# Patient Record
Sex: Female | Born: 1965 | Race: White | Hispanic: No | Smoking: Current some day smoker
Health system: Southern US, Community
[De-identification: ages and names within clinical notes are randomized; demographics above are authoritative.]

## PROBLEM LIST (undated history)

## (undated) DIAGNOSIS — Z5189 Encounter for other specified aftercare: Secondary | ICD-10-CM

## (undated) DIAGNOSIS — M810 Age-related osteoporosis without current pathological fracture: Secondary | ICD-10-CM

## (undated) DIAGNOSIS — R011 Cardiac murmur, unspecified: Secondary | ICD-10-CM

## (undated) DIAGNOSIS — Z8711 Personal history of peptic ulcer disease: Secondary | ICD-10-CM

## (undated) DIAGNOSIS — T7840XA Allergy, unspecified, initial encounter: Secondary | ICD-10-CM

## (undated) DIAGNOSIS — I10 Essential (primary) hypertension: Secondary | ICD-10-CM

## (undated) DIAGNOSIS — M1990 Unspecified osteoarthritis, unspecified site: Secondary | ICD-10-CM

## (undated) DIAGNOSIS — C801 Malignant (primary) neoplasm, unspecified: Secondary | ICD-10-CM

## (undated) DIAGNOSIS — F32A Depression, unspecified: Secondary | ICD-10-CM

## (undated) DIAGNOSIS — E079 Disorder of thyroid, unspecified: Secondary | ICD-10-CM

## (undated) DIAGNOSIS — S7290XA Unspecified fracture of unspecified femur, initial encounter for closed fracture: Secondary | ICD-10-CM

## (undated) DIAGNOSIS — M4850XA Collapsed vertebra, not elsewhere classified, site unspecified, initial encounter for fracture: Secondary | ICD-10-CM

## (undated) DIAGNOSIS — Z9884 Bariatric surgery status: Secondary | ICD-10-CM

## (undated) DIAGNOSIS — S72009A Fracture of unspecified part of neck of unspecified femur, initial encounter for closed fracture: Secondary | ICD-10-CM

## (undated) DIAGNOSIS — F1910 Other psychoactive substance abuse, uncomplicated: Secondary | ICD-10-CM

## (undated) HISTORY — PX: BREAST BIOPSY: SHX20

## (undated) HISTORY — DX: Cardiac murmur, unspecified: R01.1

## (undated) HISTORY — DX: Allergy, unspecified, initial encounter: T78.40XA

## (undated) HISTORY — DX: Unspecified osteoarthritis, unspecified site: M19.90

## (undated) HISTORY — DX: Other psychoactive substance abuse, uncomplicated: F19.10

## (undated) HISTORY — DX: Collapsed vertebra, not elsewhere classified, site unspecified, initial encounter for fracture: M48.50XA

## (undated) HISTORY — DX: Disorder of thyroid, unspecified: E07.9

## (undated) HISTORY — DX: Depression, unspecified: F32.A

## (undated) HISTORY — PX: GASTRIC BYPASS: SHX52

## (undated) HISTORY — DX: Malignant (primary) neoplasm, unspecified: C80.1

## (undated) HISTORY — DX: Bariatric surgery status: Z98.84

## (undated) HISTORY — DX: Age-related osteoporosis without current pathological fracture: M81.0

## (undated) HISTORY — DX: Encounter for other specified aftercare: Z51.89

## (undated) HISTORY — DX: Fracture of unspecified part of neck of unspecified femur, initial encounter for closed fracture: S72.009A

## (undated) HISTORY — DX: Unspecified fracture of unspecified femur, initial encounter for closed fracture: S72.90XA

## (undated) HISTORY — PX: FRACTURE SURGERY: SHX138

---

## 2000-07-17 ENCOUNTER — Other Ambulatory Visit: Admission: RE | Admit: 2000-07-17 | Discharge: 2000-07-17 | Payer: Self-pay | Admitting: Obstetrics & Gynecology

## 2000-08-20 ENCOUNTER — Encounter: Payer: Self-pay | Admitting: Obstetrics & Gynecology

## 2000-08-21 ENCOUNTER — Inpatient Hospital Stay (HOSPITAL_COMMUNITY): Admission: RE | Admit: 2000-08-21 | Discharge: 2000-08-22 | Payer: Self-pay | Admitting: Obstetrics & Gynecology

## 2003-02-08 ENCOUNTER — Encounter: Payer: Self-pay | Admitting: Orthopedic Surgery

## 2003-02-08 ENCOUNTER — Encounter: Admission: RE | Admit: 2003-02-08 | Discharge: 2003-02-08 | Payer: Self-pay | Admitting: Orthopedic Surgery

## 2003-07-06 ENCOUNTER — Emergency Department (HOSPITAL_COMMUNITY): Admission: EM | Admit: 2003-07-06 | Discharge: 2003-07-06 | Payer: Self-pay | Admitting: Emergency Medicine

## 2003-11-08 ENCOUNTER — Inpatient Hospital Stay (HOSPITAL_COMMUNITY): Admission: EM | Admit: 2003-11-08 | Discharge: 2003-11-19 | Payer: Self-pay | Admitting: Psychiatry

## 2003-11-08 ENCOUNTER — Emergency Department (HOSPITAL_COMMUNITY): Admission: EM | Admit: 2003-11-08 | Discharge: 2003-11-08 | Payer: Self-pay | Admitting: Emergency Medicine

## 2004-06-03 ENCOUNTER — Emergency Department: Payer: Self-pay | Admitting: Emergency Medicine

## 2004-06-03 ENCOUNTER — Ambulatory Visit: Payer: Self-pay | Admitting: Psychiatry

## 2004-06-04 ENCOUNTER — Inpatient Hospital Stay (HOSPITAL_COMMUNITY): Admission: EM | Admit: 2004-06-04 | Discharge: 2004-06-06 | Payer: Self-pay | Admitting: Psychiatry

## 2013-05-19 DIAGNOSIS — E785 Hyperlipidemia, unspecified: Secondary | ICD-10-CM | POA: Insufficient documentation

## 2013-05-19 DIAGNOSIS — I10 Essential (primary) hypertension: Secondary | ICD-10-CM | POA: Insufficient documentation

## 2013-08-13 HISTORY — PX: GASTRIC BYPASS: SHX52

## 2016-07-25 DIAGNOSIS — E249 Cushing's syndrome, unspecified: Secondary | ICD-10-CM | POA: Diagnosis present

## 2016-08-23 DIAGNOSIS — J4489 Other specified chronic obstructive pulmonary disease: Secondary | ICD-10-CM | POA: Diagnosis present

## 2016-08-23 DIAGNOSIS — J449 Chronic obstructive pulmonary disease, unspecified: Secondary | ICD-10-CM | POA: Diagnosis present

## 2016-08-23 DIAGNOSIS — F17200 Nicotine dependence, unspecified, uncomplicated: Secondary | ICD-10-CM | POA: Insufficient documentation

## 2017-03-28 DIAGNOSIS — F411 Generalized anxiety disorder: Secondary | ICD-10-CM | POA: Insufficient documentation

## 2020-12-30 DIAGNOSIS — E222 Syndrome of inappropriate secretion of antidiuretic hormone: Secondary | ICD-10-CM | POA: Insufficient documentation

## 2021-01-20 DIAGNOSIS — E440 Moderate protein-calorie malnutrition: Secondary | ICD-10-CM | POA: Diagnosis present

## 2021-01-20 DIAGNOSIS — E46 Unspecified protein-calorie malnutrition: Secondary | ICD-10-CM | POA: Diagnosis present

## 2021-01-20 DIAGNOSIS — G4733 Obstructive sleep apnea (adult) (pediatric): Secondary | ICD-10-CM | POA: Insufficient documentation

## 2021-06-08 DIAGNOSIS — K7581 Nonalcoholic steatohepatitis (NASH): Secondary | ICD-10-CM | POA: Insufficient documentation

## 2021-06-20 DIAGNOSIS — K289 Gastrojejunal ulcer, unspecified as acute or chronic, without hemorrhage or perforation: Secondary | ICD-10-CM | POA: Insufficient documentation

## 2021-07-17 DIAGNOSIS — K559 Vascular disorder of intestine, unspecified: Secondary | ICD-10-CM | POA: Insufficient documentation

## 2021-08-20 ENCOUNTER — Inpatient Hospital Stay (HOSPITAL_COMMUNITY): Payer: BC Managed Care – PPO

## 2021-08-20 ENCOUNTER — Emergency Department (HOSPITAL_COMMUNITY): Payer: BC Managed Care – PPO

## 2021-08-20 ENCOUNTER — Encounter (HOSPITAL_COMMUNITY): Payer: Self-pay

## 2021-08-20 ENCOUNTER — Inpatient Hospital Stay (HOSPITAL_COMMUNITY)
Admission: EM | Admit: 2021-08-20 | Discharge: 2021-08-23 | DRG: 388 | Disposition: A | Discharge status: Home / Self Care | Payer: BC Managed Care – PPO | Attending: Student in an Organized Health Care Education/Training Program | Admitting: Student in an Organized Health Care Education/Training Program

## 2021-08-20 ENCOUNTER — Other Ambulatory Visit: Payer: Self-pay

## 2021-08-20 DIAGNOSIS — D649 Anemia, unspecified: Secondary | ICD-10-CM | POA: Diagnosis present

## 2021-08-20 DIAGNOSIS — Z85850 Personal history of malignant neoplasm of thyroid: Secondary | ICD-10-CM

## 2021-08-20 DIAGNOSIS — Z9049 Acquired absence of other specified parts of digestive tract: Secondary | ICD-10-CM | POA: Diagnosis not present

## 2021-08-20 DIAGNOSIS — K838 Other specified diseases of biliary tract: Secondary | ICD-10-CM | POA: Diagnosis present

## 2021-08-20 DIAGNOSIS — E871 Hypo-osmolality and hyponatremia: Secondary | ICD-10-CM | POA: Diagnosis present

## 2021-08-20 DIAGNOSIS — M24452 Recurrent dislocation, left hip: Secondary | ICD-10-CM | POA: Diagnosis present

## 2021-08-20 DIAGNOSIS — F1120 Opioid dependence, uncomplicated: Secondary | ICD-10-CM | POA: Diagnosis present

## 2021-08-20 DIAGNOSIS — J449 Chronic obstructive pulmonary disease, unspecified: Secondary | ICD-10-CM | POA: Diagnosis present

## 2021-08-20 DIAGNOSIS — F319 Bipolar disorder, unspecified: Secondary | ICD-10-CM | POA: Diagnosis present

## 2021-08-20 DIAGNOSIS — R109 Unspecified abdominal pain: Secondary | ICD-10-CM | POA: Diagnosis present

## 2021-08-20 DIAGNOSIS — M25552 Pain in left hip: Secondary | ICD-10-CM | POA: Diagnosis present

## 2021-08-20 DIAGNOSIS — D72819 Decreased white blood cell count, unspecified: Secondary | ICD-10-CM | POA: Diagnosis present

## 2021-08-20 DIAGNOSIS — K56699 Other intestinal obstruction unspecified as to partial versus complete obstruction: Principal | ICD-10-CM | POA: Diagnosis present

## 2021-08-20 DIAGNOSIS — G8929 Other chronic pain: Secondary | ICD-10-CM | POA: Diagnosis present

## 2021-08-20 DIAGNOSIS — E43 Unspecified severe protein-calorie malnutrition: Secondary | ICD-10-CM | POA: Diagnosis present

## 2021-08-20 DIAGNOSIS — Z20822 Contact with and (suspected) exposure to covid-19: Secondary | ICD-10-CM | POA: Diagnosis present

## 2021-08-20 DIAGNOSIS — E440 Moderate protein-calorie malnutrition: Secondary | ICD-10-CM | POA: Diagnosis present

## 2021-08-20 DIAGNOSIS — R748 Abnormal levels of other serum enzymes: Secondary | ICD-10-CM | POA: Diagnosis present

## 2021-08-20 DIAGNOSIS — E249 Cushing's syndrome, unspecified: Secondary | ICD-10-CM | POA: Diagnosis present

## 2021-08-20 DIAGNOSIS — R1013 Epigastric pain: Secondary | ICD-10-CM | POA: Diagnosis present

## 2021-08-20 DIAGNOSIS — M810 Age-related osteoporosis without current pathological fracture: Secondary | ICD-10-CM | POA: Diagnosis present

## 2021-08-20 DIAGNOSIS — E876 Hypokalemia: Secondary | ICD-10-CM | POA: Diagnosis present

## 2021-08-20 DIAGNOSIS — I10 Essential (primary) hypertension: Secondary | ICD-10-CM | POA: Diagnosis present

## 2021-08-20 DIAGNOSIS — Z681 Body mass index (BMI) 19 or less, adult: Secondary | ICD-10-CM | POA: Diagnosis not present

## 2021-08-20 DIAGNOSIS — Z9884 Bariatric surgery status: Secondary | ICD-10-CM

## 2021-08-20 DIAGNOSIS — R0902 Hypoxemia: Secondary | ICD-10-CM | POA: Diagnosis present

## 2021-08-20 DIAGNOSIS — E8809 Other disorders of plasma-protein metabolism, not elsewhere classified: Secondary | ICD-10-CM | POA: Diagnosis present

## 2021-08-20 DIAGNOSIS — K760 Fatty (change of) liver, not elsewhere classified: Secondary | ICD-10-CM | POA: Diagnosis present

## 2021-08-20 DIAGNOSIS — R933 Abnormal findings on diagnostic imaging of other parts of digestive tract: Secondary | ICD-10-CM | POA: Insufficient documentation

## 2021-08-20 DIAGNOSIS — J439 Emphysema, unspecified: Secondary | ICD-10-CM | POA: Diagnosis present

## 2021-08-20 DIAGNOSIS — E039 Hypothyroidism, unspecified: Secondary | ICD-10-CM | POA: Diagnosis present

## 2021-08-20 DIAGNOSIS — E46 Unspecified protein-calorie malnutrition: Secondary | ICD-10-CM | POA: Diagnosis present

## 2021-08-20 DIAGNOSIS — Z79899 Other long term (current) drug therapy: Secondary | ICD-10-CM | POA: Diagnosis not present

## 2021-08-20 DIAGNOSIS — R7989 Other specified abnormal findings of blood chemistry: Secondary | ICD-10-CM | POA: Diagnosis present

## 2021-08-20 DIAGNOSIS — Z96642 Presence of left artificial hip joint: Secondary | ICD-10-CM | POA: Diagnosis present

## 2021-08-20 DIAGNOSIS — Z79890 Hormone replacement therapy (postmenopausal): Secondary | ICD-10-CM | POA: Diagnosis not present

## 2021-08-20 DIAGNOSIS — R531 Weakness: Secondary | ICD-10-CM

## 2021-08-20 DIAGNOSIS — Z95828 Presence of other vascular implants and grafts: Secondary | ICD-10-CM

## 2021-08-20 HISTORY — DX: Essential (primary) hypertension: I10

## 2021-08-20 HISTORY — DX: Personal history of peptic ulcer disease: Z87.11

## 2021-08-20 LAB — CBC WITH DIFFERENTIAL/PLATELET
Abs Immature Granulocytes: 0.02 10*3/uL (ref 0.00–0.07)
Basophils Absolute: 0 10*3/uL (ref 0.0–0.1)
Basophils Relative: 0 %
Eosinophils Absolute: 0 10*3/uL (ref 0.0–0.5)
Eosinophils Relative: 1 %
HCT: 40.8 % (ref 36.0–46.0)
Hemoglobin: 12.3 g/dL (ref 12.0–15.0)
Immature Granulocytes: 1 %
Lymphocytes Relative: 21 %
Lymphs Abs: 0.7 10*3/uL (ref 0.7–4.0)
MCH: 28.1 pg (ref 26.0–34.0)
MCHC: 30.1 g/dL (ref 30.0–36.0)
MCV: 93.2 fL (ref 80.0–100.0)
Monocytes Absolute: 0.3 10*3/uL (ref 0.1–1.0)
Monocytes Relative: 8 %
Neutro Abs: 2.3 10*3/uL (ref 1.7–7.7)
Neutrophils Relative %: 69 %
Platelets: 205 10*3/uL (ref 150–400)
RBC: 4.38 MIL/uL (ref 3.87–5.11)
RDW: 14.1 % (ref 11.5–15.5)
WBC: 3.3 10*3/uL — ABNORMAL LOW (ref 4.0–10.5)
nRBC: 0 % (ref 0.0–0.2)

## 2021-08-20 LAB — COMPREHENSIVE METABOLIC PANEL
ALT: 110 U/L — ABNORMAL HIGH (ref 0–44)
AST: 111 U/L — ABNORMAL HIGH (ref 15–41)
Albumin: 3.1 g/dL — ABNORMAL LOW (ref 3.5–5.0)
Alkaline Phosphatase: 733 U/L — ABNORMAL HIGH (ref 38–126)
Anion gap: 7 (ref 5–15)
BUN: 17 mg/dL (ref 6–20)
CO2: 28 mmol/L (ref 22–32)
Calcium: 9.5 mg/dL (ref 8.9–10.3)
Chloride: 100 mmol/L (ref 98–111)
Creatinine, Ser: 0.51 mg/dL (ref 0.44–1.00)
GFR, Estimated: >60 mL/min (ref >60)
Glucose, Bld: 119 mg/dL — ABNORMAL HIGH (ref 70–99)
Potassium: 4.1 mmol/L (ref 3.5–5.1)
Sodium: 135 mmol/L (ref 135–145)
Total Bilirubin: 0.3 mg/dL (ref 0.3–1.2)
Total Protein: 7.3 g/dL (ref 6.5–8.1)

## 2021-08-20 LAB — RESP PANEL BY RT-PCR (FLU A&B, COVID) ARPGX2
Influenza A by PCR: NEGATIVE
Influenza B by PCR: NEGATIVE
SARS Coronavirus 2 by RT PCR: NEGATIVE

## 2021-08-20 LAB — LACTIC ACID, PLASMA: Lactic Acid, Venous: 1.6 mmol/L (ref 0.5–1.9)

## 2021-08-20 LAB — LIPASE, BLOOD: Lipase: 27 U/L (ref 11–51)

## 2021-08-20 MED ORDER — ACETAMINOPHEN 325 MG PO TABS
650.0000 mg | ORAL_TABLET | Freq: Four times a day (QID) | ORAL | Status: DC | PRN
  Administered 2021-08-20 – 2021-08-21 (×3): 650 mg via ORAL
  Filled 2021-08-20 (×3): qty 2

## 2021-08-20 MED ORDER — ENOXAPARIN SODIUM 30 MG/0.3ML IJ SOSY
30.0000 mg | PREFILLED_SYRINGE | INTRAMUSCULAR | Status: DC
  Administered 2021-08-20: 30 mg via SUBCUTANEOUS
  Filled 2021-08-20: qty 0.3

## 2021-08-20 MED ORDER — HYDROMORPHONE HCL 1 MG/ML IJ SOLN
1.0000 mg | INTRAMUSCULAR | Status: DC | PRN
  Administered 2021-08-20 – 2021-08-23 (×18): 1 mg via INTRAVENOUS
  Filled 2021-08-20 (×19): qty 1

## 2021-08-20 MED ORDER — PANTOPRAZOLE SODIUM 40 MG IV SOLR
40.0000 mg | Freq: Two times a day (BID) | INTRAVENOUS | Status: DC
  Administered 2021-08-20 – 2021-08-22 (×3): 40 mg via INTRAVENOUS
  Filled 2021-08-20 (×3): qty 40

## 2021-08-20 MED ORDER — IOHEXOL 350 MG/ML SOLN
100.0000 mL | Freq: Once | INTRAVENOUS | Status: AC | PRN
  Administered 2021-08-20: 100 mL via INTRAVENOUS

## 2021-08-20 MED ORDER — SUCRALFATE 1 G PO TABS
1.0000 g | ORAL_TABLET | Freq: Three times a day (TID) | ORAL | Status: DC
  Administered 2021-08-20 – 2021-08-21 (×3): 1 g via ORAL
  Filled 2021-08-20 (×3): qty 1

## 2021-08-20 MED ORDER — LEVOTHYROXINE SODIUM 100 MCG PO TABS
100.0000 ug | ORAL_TABLET | Freq: Every day | ORAL | Status: DC
  Administered 2021-08-21 – 2021-08-23 (×3): 100 ug via ORAL
  Filled 2021-08-20 (×4): qty 1

## 2021-08-20 MED ORDER — SODIUM CHLORIDE 0.9 % IV SOLN: 1.0000 g | Freq: Once | INTRAVENOUS | Status: DC

## 2021-08-20 MED ORDER — SODIUM CHLORIDE 0.9 % IV SOLN: 500.0000 mg | Freq: Once | INTRAVENOUS | Status: DC

## 2021-08-20 MED ORDER — SODIUM CHLORIDE 0.9 % IV BOLUS
1000.0000 mL | Freq: Once | INTRAVENOUS | Status: AC
  Administered 2021-08-20: 1000 mL via INTRAVENOUS

## 2021-08-20 MED ORDER — PROCHLORPERAZINE EDISYLATE 10 MG/2ML IJ SOLN
10.0000 mg | Freq: Once | INTRAMUSCULAR | Status: AC
  Administered 2021-08-20: 10 mg via INTRAVENOUS
  Filled 2021-08-20: qty 2

## 2021-08-20 MED ORDER — QUETIAPINE FUMARATE 100 MG PO TABS
300.0000 mg | ORAL_TABLET | Freq: Every day | ORAL | Status: DC
  Administered 2021-08-20 – 2021-08-22 (×3): 300 mg via ORAL
  Filled 2021-08-20: qty 3
  Filled 2021-08-20: qty 1
  Filled 2021-08-20: qty 3
  Filled 2021-08-20: qty 1

## 2021-08-20 MED ORDER — SENNA 8.6 MG PO TABS
1.0000 | ORAL_TABLET | Freq: Two times a day (BID) | ORAL | Status: DC
  Administered 2021-08-20 – 2021-08-22 (×6): 8.6 mg via ORAL
  Filled 2021-08-20 (×7): qty 1

## 2021-08-20 MED ORDER — HYDROMORPHONE HCL 1 MG/ML IJ SOLN
1.0000 mg | Freq: Once | INTRAMUSCULAR | Status: AC
  Administered 2021-08-20: 1 mg via INTRAVENOUS
  Filled 2021-08-20: qty 1

## 2021-08-20 MED ORDER — MISOPROSTOL 200 MCG PO TABS
200.0000 ug | ORAL_TABLET | Freq: Four times a day (QID) | ORAL | Status: DC
  Administered 2021-08-20 – 2021-08-23 (×11): 200 ug via ORAL
  Filled 2021-08-20 (×15): qty 1

## 2021-08-20 MED ORDER — OXYCODONE HCL 5 MG PO TABS
5.0000 mg | ORAL_TABLET | ORAL | Status: DC | PRN
  Administered 2021-08-20 – 2021-08-22 (×8): 5 mg via ORAL
  Filled 2021-08-20 (×8): qty 1

## 2021-08-20 MED ORDER — PANTOPRAZOLE SODIUM 40 MG IV SOLR
40.0000 mg | Freq: Once | INTRAVENOUS | Status: AC
  Administered 2021-08-20: 40 mg via INTRAVENOUS
  Filled 2021-08-20: qty 40

## 2021-08-20 MED ORDER — HYDROMORPHONE HCL 1 MG/ML IJ SOLN
1.0000 mg | Freq: Once | INTRAMUSCULAR | Status: AC
Start: 2021-08-20 — End: 2021-08-20
  Administered 2021-08-20: 1 mg via INTRAVENOUS
  Filled 2021-08-20: qty 1

## 2021-08-20 MED ORDER — ONDANSETRON HCL 4 MG PO TABS
4.0000 mg | ORAL_TABLET | Freq: Four times a day (QID) | ORAL | Status: DC | PRN
  Administered 2021-08-23: 4 mg via ORAL
  Filled 2021-08-20: qty 1

## 2021-08-20 MED ORDER — SPIRONOLACTONE 25 MG PO TABS
25.0000 mg | ORAL_TABLET | Freq: Every day | ORAL | Status: DC
  Administered 2021-08-20 – 2021-08-23 (×4): 25 mg via ORAL
  Filled 2021-08-20 (×5): qty 1

## 2021-08-20 MED ORDER — SODIUM CHLORIDE 0.9 % IV SOLN
1000.0000 mL | INTRAVENOUS | Status: AC
  Administered 2021-08-20 – 2021-08-22 (×4): 1000 mL via INTRAVENOUS

## 2021-08-20 MED ORDER — ALUM & MAG HYDROXIDE-SIMETH 200-200-20 MG/5ML PO SUSP: 30.0000 mL | Freq: Four times a day (QID) | ORAL | Status: DC | PRN

## 2021-08-20 MED ORDER — LIDOCAINE VISCOUS HCL 2 % MT SOLN
10.0000 mL | Freq: Once | OROMUCOSAL | Status: AC
  Administered 2021-08-20: 10 mL via OROMUCOSAL
  Filled 2021-08-20: qty 15

## 2021-08-20 MED ORDER — ONDANSETRON HCL 4 MG/2ML IJ SOLN
4.0000 mg | Freq: Four times a day (QID) | INTRAMUSCULAR | Status: DC | PRN
  Administered 2021-08-20 – 2021-08-23 (×5): 4 mg via INTRAVENOUS
  Filled 2021-08-20 (×5): qty 2

## 2021-08-20 MED ORDER — ACETAMINOPHEN 650 MG RE SUPP: 650.0000 mg | Freq: Four times a day (QID) | RECTAL | Status: DC | PRN

## 2021-08-20 MED ORDER — LIDOCAINE VISCOUS HCL 2 % MT SOLN
15.0000 mL | Freq: Four times a day (QID) | OROMUCOSAL | Status: DC | PRN
  Filled 2021-08-20: qty 15

## 2021-08-20 MED ORDER — DIVALPROEX SODIUM 125 MG PO DR TAB
250.0000 mg | DELAYED_RELEASE_TABLET | Freq: Two times a day (BID) | ORAL | Status: DC
  Administered 2021-08-20 – 2021-08-23 (×6): 250 mg via ORAL
  Filled 2021-08-20 (×8): qty 2

## 2021-08-20 MED ORDER — SODIUM CHLORIDE 0.9 % IV BOLUS (SEPSIS)
1000.0000 mL | Freq: Once | INTRAVENOUS | Status: AC
  Administered 2021-08-20: 1000 mL via INTRAVENOUS

## 2021-08-20 MED ORDER — GADOBUTROL 1 MMOL/ML IV SOLN
5.0000 mL | Freq: Once | INTRAVENOUS | Status: AC | PRN
  Administered 2021-08-20: 5 mL via INTRAVENOUS

## 2021-08-20 MED ORDER — METOPROLOL TARTRATE 25 MG PO TABS
25.0000 mg | ORAL_TABLET | Freq: Two times a day (BID) | ORAL | Status: DC
  Administered 2021-08-20 – 2021-08-23 (×7): 25 mg via ORAL
  Filled 2021-08-20 (×7): qty 1

## 2021-08-20 NOTE — ED Notes (Signed)
Pt transported to MRI with tech

## 2021-08-20 NOTE — ED Provider Notes (Signed)
Blood pressure 138/81, pulse (!) 125, temperature 98 F (36.7 C), temperature source Oral, resp. rate 17, height 5' 7"  (1.702 m), weight 51.3 kg, SpO2 97 %.  Assuming care from Dr. Leonette Monarch.  In short, Amanda Hendricks is a 56 y.o. female with a chief complaint of Abdominal Pain and Hip Pain .  Refer to the original H&P for additional details.  The current plan of care is to follow up on labs and obtain CT pending creatinine.  09:47 AM  CT imaging reviewed.  The patient has no complication from Roux-en-Y gastric bypass noted.  Her intra and extrahepatic biliary duct is dilated beyond what would be expected with history of cholecystectomy.  Plan for right upper quadrant abdominal ultrasound.  Multifocal pneumonia noted on CT as well.  Have added on a COVID test but patient with few pneumonia symptoms.  Ultimately, may require MRCP for further evaluation.  LFTs and alk phos are elevated although bilirubin is normal.  No leukocytosis.  In review of outside records from [facility] in care everywhere, on 12/3 the patient had an alk phos in the 300s but normal LFTs. Does values are higher.   10:50 AM  Spoke with [facility] our GI regarding consultation.  Agree with plan for MRCP and they will follow along.  Patient does have cough and some shortness of breath symptoms.  Her COVID and flu tests are negative.  Question of multifocal pneumonia on CT.  Will start Rocephin and azithromycin.   Discussed patient's case with IM teaching to request admission. Patient and family (if present) updated with plan. Care transferred to teaching service.  I reviewed all nursing notes, vitals, pertinent old records, EKGs, labs, imaging (as available).    Amanda Fast, MD 08/20/21 1154

## 2021-08-20 NOTE — ED Notes (Signed)
Pt return from MRI

## 2021-08-20 NOTE — ED Provider Notes (Signed)
[facility] EMERGENCY DEPARTMENT Provider Note  CSN: [id number] Arrival date & time: 08/20/21 0501  Chief Complaint(s) Abdominal Pain and Hip Pain  HPI Amanda Hendricks is a 56 y.o. female with a past medical history including status post gastric bypass complicated by bleeding peptic ulcers, bipolar, hypertension, recurrent left hip dislocations following replacement.  Patient was recently hospitalized at [facility] after being hospitalized from November 3 to January 5 for bleeding ulcers, recurring left hip dislocation but outside records reviewed.  Patient is here for epigastric abdominal pain that became severely worse last night.  No recent emesis.  Patient reports that she was told she could drink Coca-Cola and did so yesterday.  Does not take much p.o. as she is currently getting TPN.  Patient has not had a bowel movement in 4 days.  Denies melena.  No fevers or chills.  Endorses chronic coughing which is where it actually exacerbated her pain tonight.  Denied chest pain.  Reports that after the pain worsens at night she felt her heart racing and felt short of breath because of it.  This has resolved.  Also reports left hip pain stating that she slipped off the toilet yesterday and felt a pop. She has been hurting even more than normal since then.  The history is provided by the patient.   Past Medical History Past Medical History:  Diagnosis Date   History of stomach ulcers    Hypertension    There are no problems to display for this patient.  Home Medication(s) Prior to Admission medications   Medication Sig Start Date End Date Taking? Authorizing Provider  acetaminophen (TYLENOL) 325 MG tablet Take 650 mg by mouth every 6 (six) hours as needed for mild pain or headache. 08/17/21  Yes [provider]  calcium carbonate (OS-CAL) 1250 (500 Ca) MG chewable tablet Chew 500 mg by mouth 3 (three) times daily as needed for heartburn.   Yes [provider]  divalproex (DEPAKOTE) 250 MG DR tablet Take 250 mg by mouth 2 (two) times daily. 08/17/21  Yes [provider]  DULoxetine (CYMBALTA) 30 MG capsule Take 30 mg by mouth daily. 08/17/21 08/17/22 Yes [provider]  ferrous sulfate 325 (65 FE) MG EC tablet Take 1 tablet by mouth daily with breakfast. 08/17/21  Yes [provider]  levothyroxine (SYNTHROID) 100 MCG tablet Take 100 mcg by mouth daily. 02/21/21 02/21/22 Yes [provider]  Magnesium Chloride 64 MG TBEC Take 535 mg by mouth in the morning and at bedtime. 08/17/21  Yes [provider]  methocarbamol (ROBAXIN) 500 MG tablet Take 1,000 mg by mouth every 8 (eight) hours as needed for muscle spasms.   Yes [provider]  metoprolol tartrate (LOPRESSOR) 25 MG tablet Take 25 mg by mouth in the morning and at bedtime. 04/28/21 04/28/22 Yes [provider]  misoprostol (CYTOTEC) 200 MCG tablet Take 200 mcg by mouth 4 (four) times daily. 08/17/21  Yes [provider]  naloxone (NARCAN) nasal spray 4 mg/0.1 mL Place 0.4 mg into the nose as needed (accidental overdose). 02/09/20  Yes [provider]  ondansetron (ZOFRAN-ODT) 4 MG disintegrating tablet Take 4 mg by mouth every 8 (eight) hours as needed for nausea or vomiting. 08/17/21  Yes [provider]  oxyCODONE (OXY IR/ROXICODONE) 5 MG immediate release tablet Take 5 mg by mouth every 4 (four) hours as needed for moderate pain.   Yes [provider]  pantoprazole (PROTONIX) 40 MG  tablet Take 40 mg by mouth 2 (two) times daily. 08/17/21  Yes [provider]  polyethylene glycol powder (GLYCOLAX/MIRALAX) 17 GM/SCOOP powder Take 17 g by mouth daily as needed for mild constipation. 08/17/21  Yes [provider]  prochlorperazine (COMPAZINE) 5 MG tablet Take 5 mg by mouth every 8 (eight) hours as needed for nausea or vomiting. 08/17/21  Yes [provider]  promethazine (PHENERGAN) 12.5  MG tablet Take 12.5 mg by mouth every 8 (eight) hours as needed for nausea or vomiting. 08/17/21  Yes [provider]  QUEtiapine (SEROQUEL) 300 MG tablet Take 300 mg by mouth at bedtime. 08/17/21  Yes [provider]  senna-docusate (SENOKOT-S) 8.6-50 MG tablet Take 2 tablets by mouth 2 (two) times daily as needed for mild constipation. 08/17/21  Yes [provider]  spironolactone (ALDACTONE) 25 MG tablet Take 25 mg by mouth daily. 08/17/21  Yes [provider]  sucralfate (CARAFATE) 1 GM/10ML suspension Take 10 mLs by mouth 4 (four) times daily -  before meals and at bedtime. 08/17/21 02/13/22 Yes [provider]                                                                                                                                    Allergies Lisinopril  Review of Systems Review of Systems As noted in HPI  Physical Exam Vital Signs  I have reviewed the triage vital signs BP 138/81    Pulse (!) 125    Temp 98 F (36.7 C) (Oral)    Resp 17    Ht 5' 7"  (1.702 m)    Wt 51.3 kg    SpO2 97%    BMI 17.70 kg/m   Physical Exam Vitals reviewed.  Constitutional:      General: She is not in acute distress.    Appearance: She is well-developed. She is not diaphoretic.  HENT:     Head: Normocephalic and atraumatic.     Nose: Nose normal.  Eyes:     General: No scleral icterus.       Right eye: No discharge.        Left eye: No discharge.     Conjunctiva/sclera: Conjunctivae normal.     Pupils: Pupils are equal, round, and reactive to light.  Cardiovascular:     Rate and Rhythm: Normal rate and regular rhythm.     Heart sounds: No murmur heard.   No friction rub. No gallop.  Pulmonary:     Effort: Pulmonary effort is normal. No respiratory distress.     Breath sounds: Normal breath sounds. No stridor. No rales.  Abdominal:     General: There is no distension.     Palpations: Abdomen is soft.     Tenderness: There is abdominal tenderness in the  right upper quadrant and epigastric area. There is guarding. There is no rebound.  Musculoskeletal:       Arms:  Cervical back: Normal range of motion and neck supple.     Left hip: Tenderness present.       Legs:  Skin:    General: Skin is warm and dry.     Findings: No erythema or rash.  Neurological:     Mental Status: She is alert and oriented to person, place, and time.    ED Results and Treatments Labs (all labs ordered are listed, but only abnormal results are displayed) Labs Reviewed  COMPREHENSIVE METABOLIC PANEL - Abnormal; Notable for the following components:      Result Value   Glucose, Bld 119 (*)    Albumin 3.1 (*)    AST 111 (*)    ALT 110 (*)    Alkaline Phosphatase 733 (*)    All other components within normal limits  LIPASE, BLOOD  CBC WITH DIFFERENTIAL/PLATELET  LACTIC ACID, PLASMA  I-STAT CHEM 8, ED                                                                                                                         EKG  EKG Interpretation  Date/Time:    Ventricular Rate:    PR Interval:    QRS Duration:   QT Interval:    QTC Calculation:   R Axis:     Text Interpretation:         Radiology DG HIP UNILAT W OR W/O PELVIS 2-3 VIEWS LEFT  Result Date: 08/20/2021 CLINICAL DATA:  Left hip pain, recent surgery EXAM: DG HIP (WITH OR WITHOUT PELVIS) 2-3V LEFT COMPARISON:  None. FINDINGS: Evidence of prior complex revision left total hip arthroplasty. The femoral component appears located with respect to the acetabular component. The lateral buttress plate and cerclage wire construct appears intact. No evidence of periprosthetic fracture or other hardware related complication. Visualized bony pelvis is intact. Oral contrast material partially opacifies the colon. IMPRESSION: Surgical changes of prior complex revision left total hip arthroplasty without evidence of hardware complication. Electronically Signed   By: Jacqulynn Cadet M.D.   On:  08/20/2021 07:04    Pertinent labs & imaging results that were available during my care of the patient were reviewed by me and considered in my medical decision making (see MDM for details).  Medications Ordered in ED Medications  sodium chloride 0.9 % bolus 1,000 mL (has no administration in time range)  sodium chloride 0.9 % bolus 1,000 mL (1,000 mLs Intravenous New Bag/Given 08/20/21 4742)    Followed by  0.9 %  sodium chloride infusion (has no administration in time range)  HYDROmorphone (DILAUDID) injection 1 mg (1 mg Intravenous Given 08/20/21 0639)  pantoprazole (PROTONIX) injection 40 mg (40 mg Intravenous Given 08/20/21 0639)  lidocaine (XYLOCAINE) 2 % viscous mouth solution 10 mL (10 mLs Mouth/Throat Given 08/20/21 0640)  prochlorperazine (COMPAZINE) injection 10 mg (10 mg Intravenous Given 08/20/21 0635)  Procedures Procedures  (including critical care time)  Medical Decision Making / ED Course        Epigastric abdominal pain Status post gastric bypass with known gastric ulcers. Will assess for any complications from the above.  We will also assess for biliary disease and pancreatitis. Presentation is not concerning for cardiac etiology but will obtain EKG for baseline. Work-up ordered to assess concerns above.  Labs and imaging independently interpreted by me and noted below: Elevated LFT, doubled since 12/01 at [facility] Intact renal function No evidence of pancreatitis Management: Will provide patient with IV pain medicine, IV fluids, antiemetic, and GI cocktail. Low threshold for CT      Left hip pain Known left hip replacement complicated by recurrent dislocations. No recent trauma but patient reported a pop yesterday. Will assess for recurrent dislocation. Work-up ordered to assess concerns above.  Imaging independently interpreted by me and  noted below: Hip XR w/o dislocation.  Confirmed by radiology who also noted no hardware instability Management: IV pain meds Reassessment: Pain improved   Patient care turned over to oncoming provider. Patient case and results discussed in detail; please see their note for further ED managment.    Final Clinical Impression(s) / ED Diagnoses Final diagnoses:  Epigastric abdominal pain  Left hip pain           This chart was dictated using voice recognition software.  Despite best efforts to proofread,  errors can occur which can change the documentation meaning.    Fatima Blank, MD 08/20/21 [phone]

## 2021-08-20 NOTE — Progress Notes (Addendum)
°   08/20/21 1848  TOC Assessment  Expected Discharge [place]  Final next level of care [place] (Patient is on TPN and  supplies are from [facility] [phone])  Choice offered to / list presented to  Patient  DME Agency Other - Comment (TPN and supplies are provided by [facility])  [place] Other - See comment ([facility] [phone])   ED RNCM met with patient in H21 to discuss TOC transitional care planning. Patient presents to [facility] today with Abdominal Pains. Patient lives at home alone Patient has had recurrent GI bleeds and was started on TNP since Nov 2022  at home, managed by [facility] Patient receives primary care with [facility] Dr. Shelton Silvas.  TOC will continue to follow patient to secure safe transition home.

## 2021-08-20 NOTE — ED Triage Notes (Signed)
Pt BIB [facility] from home c/o CP that felt like tightness and pt states she felt like her heart was racing. Pt states it woke her up out of her sleep at 3am. Pt also endorses nausea. Pt rates pain 5/10. Pt states the racing feeling went away. Pt has PICC in right arm and receives TPN. Pt received 324 ASA. Pt also took 68m of oxycodone before leaving the house.

## 2021-08-20 NOTE — Progress Notes (Deleted)
Date: 08/20/2021               Patient Name:  Amanda Hendricks MRN: [id number]  DOB: July 11, 1966 Age / Sex: 56 y.o., female   PCP: Pcp, No         Medical Service: Internal Medicine Teaching Service         Attending Physician: Dr. Evette Doffing, Mallie Mussel, *    First Contact: Dr. Marlou Sa Pager: [phone]  Second Contact: Dr. Collene Gobble Pager: [phone]       After Hours (After 5p/  First Contact Pager: [phone]  weekends / holidays): Second Contact Pager: [phone]   Chief Complaint: abdominal pain  History of Present Illness: Amanda Hendricks is a 56 y.o. F with past medical history of gastric bypass surgery in 2015, cholecystectomy, Cushing disease, osteoporosis, hypothyroidism, who presents with increased abdominal and left hip pain that began on 01/06. The patient was recently admitted to [facility] for 2 months after suffering a hip location and this hospital stay was complicated by recurrent GI bleeds, marginal ulcer that led to reported stenosis. She has been on TPN since November with evidence of elevated hepatic enzymes over these last several months. She is currently awaiting reversal of Roux-en-Y. She says that she returned home on 01/05 and that her pain returned on 01/06. The pain is both in her L hip as well as abdomen. She states that she is able to tolerate small amounts of broth at a time but otherwise is not able to tolerate PO intake. She has not had a bowel movement in 5 days. No urinary complaints.   She denies fevers, chills, hematemesis, hematochezia. She endorses nausea, shortness of breath associated with pain episodes.  ED Course: CBC showed leukopenia at 3.3.  CMP remarkable for hypoalbuminemia 3.1, elevated AST and ALT at 111 and 110 respectively, elevated alk phos at 733.  Lipase and lactic acid both within normal limits.  X-ray left hip without evidence of hardware complication.  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  RUQ ultrasound again showed intra and extrahepatic biliary ductal dilatation without intraductal filling defect, hepatomegaly with possible cirrhosis.  Meds:  Current Meds  Medication Sig   acetaminophen (TYLENOL) 325 MG tablet Take 650 mg by mouth every 6 (six) hours as needed for mild pain or headache.   calcium carbonate (OS-CAL) 1250 (500 Ca) MG chewable tablet Chew 500 mg by mouth 3 (three) times daily as needed for heartburn.   divalproex (DEPAKOTE) 250 MG DR tablet Take 250 mg by mouth 2 (two) times daily.   DULoxetine (CYMBALTA) 30 MG capsule Take 30 mg by mouth daily.   ferrous sulfate 325 (65 FE) MG EC tablet Take 1 tablet by mouth daily with breakfast.   levothyroxine (SYNTHROID) 100 MCG tablet Take 100 mcg by mouth daily.   Magnesium Chloride 64 MG TBEC Take 535 mg by mouth in the morning and at bedtime.   methocarbamol (ROBAXIN) 500 MG tablet Take 1,000 mg by mouth every 8 (eight) hours as needed for muscle spasms.   metoprolol tartrate (LOPRESSOR) 25 MG tablet Take 25 mg by mouth in the morning and at bedtime.   misoprostol (CYTOTEC) 200 MCG tablet Take 200 mcg by mouth 4 (four) times daily.   naloxone (NARCAN) nasal spray 4 mg/0.1 mL Place 0.4 mg into the nose as needed (accidental overdose).   ondansetron (ZOFRAN-ODT) 4  MG disintegrating tablet Take 4 mg by mouth every 8 (eight) hours as needed for nausea or vomiting.   oxyCODONE (OXY IR/ROXICODONE) 5 MG immediate release tablet Take 5 mg by mouth every 4 (four) hours as needed for moderate pain.   pantoprazole (PROTONIX) 40 MG tablet Take 40 mg by mouth 2 (two) times daily.   polyethylene glycol powder (GLYCOLAX/MIRALAX) 17 GM/SCOOP powder Take 17 g by mouth daily as needed for mild constipation.   prochlorperazine (COMPAZINE) 5 MG tablet Take 5 mg by mouth every 8 (eight) hours as needed for nausea or vomiting.    promethazine (PHENERGAN) 12.5 MG tablet Take 12.5 mg by mouth every 8 (eight) hours as needed for nausea or vomiting.   QUEtiapine (SEROQUEL) 300 MG tablet Take 300 mg by mouth at bedtime.   senna-docusate (SENOKOT-S) 8.6-50 MG tablet Take 2 tablets by mouth 2 (two) times daily as needed for mild constipation.   spironolactone (ALDACTONE) 25 MG tablet Take 25 mg by mouth daily.   sucralfate (CARAFATE) 1 GM/10ML suspension Take 10 mLs by mouth 4 (four) times daily -  before meals and at bedtime.   [DISCONTINUED] melatonin 3 MG TABS tablet Take 6 mg by mouth at bedtime.   [DISCONTINUED] nicotine (NICODERM CQ - DOSED IN MG/24 HR) 7 mg/24hr patch Place 7 mg onto the skin daily.     Allergies: Allergies as of 08/20/2021 - Review Complete 08/20/2021  Allergen Reaction Noted   Lisinopril  05/11/2013   Past Medical History:  Diagnosis Date   History of stomach ulcers    Hypertension     Family History: Negative for pertinent family history.  Social History: Patient lives with her mom who helps to care for her, as well as her son who cares for both her and her mom.   Review of Systems: A complete ROS was negative except as per HPI.   Physical Exam: Blood pressure 138/79, pulse (!) 110, temperature 98 F (36.7 C), temperature source Oral, resp. rate 17, height 5' 7"  (1.702 m), weight 51.3 kg, SpO2 98 %. Constitutional: Chronically ill appearing female, in no acute distress. Eyes: Non-icteric. Cardio: Tachycardic with regular rhythm. No murmurs, rubs, gallops. Pulm: Crackles present in bilateral lung bases. Normal work of breathing on room air. Abdomen: Tenderness most pronounced in RUQ, RLQ. Diffusely guarded. MSK: Negative for extremity edema. Distal pulses normal. Skin: Skin is warm and dry. Neuro: Alert and oriented x3. No focal deficit noted. Psych: Normal mood and affect.  EKG: personally reviewed my interpretation is sinus tachycardia with evidence of left ventricular  hypertrophy.  Assessment & Plan by Problem: Principal Problem:   Abdominal pain  Amanda Hendricks is a 56 y.o. F with past medical history of gastric bypass surgery in 2015, cholecystectomy, Cushing disease, osteoporosis, hypertension, hypothyroidism s/p R thyroid lobectomy in 2018, bipolar 1 disorder who presents with increased abdominal pain admitted for possible choledocholithiasis.   #Abdominal pain #S/p Roux-en-Y, 2015 Patient has a history of GI bleeds during recent hospitalization as well as history of duodenal stricturing, cholecystectomy, and Roux-en-Y procedure in 2015. Home management includes pantoprazole 40 mg twice daily and misoprostol.  -GI following, appreciate their assistance  -MRCP ordered  -Continue Protonix 40 mg IV twice daily, misoprostol 200 mcg every 6 hours; resume Carafate 1 g 3 times daily with meals and at bedtime  -Consider repeat EGD if symptoms persist despite medical therapy if MRCP is negative -Zofran 4 mg every 6 hours as needed nausea -Scheduled senna twice daily for  constipation present on admission -NS 125 cc/hr  #Left hip pain Patient was recently discharged from [facility] where she had a prolonged stay secondary to recent hip dislocation.  She does complain of significant pain in this area on presentation.  Fortunately x-ray did not show any evidence of hardware complication. -Pain management: Dilaudid 1 mg IV every 4 hours as needed severe pain, oxycodone 5 mg every 4 hours as needed moderate pain -PT/OT  #Hypertension Blood pressure measurements have been largely normotensive to this point.  She is managed with spironolactone and metoprolol at home. -Continue home spironolactone 25 mg daily, metoprolol 25 mg twice daily  #Hypothyroidism #History of papillary microcarcinoma of the thyroid, 2018 #S/p right thyroid lobectomy, 2018 Most recent thyroid panel in November showed TSH at upper limit of normal, 5.65 with free T4 of 0.81.  Patient is  managed with levothyroxine 100 mcg daily. -Recheck TSH -Continue levothyroxine 100 mcg daily  #Bipolar 1 disorder Managed with valproic acid 250 mg twice daily, Seroquel 300 mg daily. -Continue valproic acid 250 mg twice daily, Seroquel 300 mg daily   Dispo: Admit patient to Inpatient with expected length of stay greater than 2 midnights.  Signed: Farrel Gordon, DO 08/20/2021, 3:16 PM  Pager: [phone] After 5pm on weekdays and 1pm on weekends: On Call pager: [phone]

## 2021-08-20 NOTE — ED Notes (Signed)
Pt NAD in bed, a/ox4. Pt states she has severe diffuse 9/10 ABD pain and also left hip/femur pain s/p SX. Pt states she had gastric bypass in 2015 and has had ulcers since, leading her to be placed on TPN through a PICC line in RUE, which is currently running. Pt pain increase on palp to all areas. +nausea, denies vomiting or black stool.

## 2021-08-20 NOTE — Consult Note (Signed)
Consultation  Referring Provider: Dr. Laverta Baltimore     Primary Care Physician:  Pcp, No Primary Gastroenterologist: [facility]        Reason for Consultation: Epigastric Abdominal Pain, Elevated LFT's             HPI:   Amanda Hendricks is a 56 y.o. female with medical history including status post gastric bypass complicated by bleeding peptic ulcers, hypertension, recurrent left hip dislocations following replacement, who presented to the hospital today with epigastric abdominal pain.    Patient was recently hospitalized at [facility] from November 3 - January 5 for bleeding ulcers and recurring hip dislocation.  Underwent multiple endoscopic work-ups there with ulcers.    Today, patient complains mostly of extreme hip pain.  Describes that she broke her hip and femur 2 times recently and since being home over the past few days has found it hard to lower herself onto the toilet and just "drops", due to this feels like she is exacerbated her problems.     In regards to epigastric discomfort patient describes that she has had constant epigastric discomfort ever since diagnosis of her gastric ulcers during her last hospitalization.  This is really not changed but will increase and decrease in intensity depending on what she has eaten or drank.  Currently she is still on TPN (started about a month and a half ago) but is taking in clears and sometimes she thinks she "overdoes it".  She is on Pantoprazole 40 mg twice daily and on Misoprostol.  Explains that she is supposed to be on Carafate but her insurance has been unable to cover this.  Describes getting some liquid in the ER upon arrival which helped a lot.  Also describes chronic coughing which actually exacerbated her pain yesterday evening.  Associated symptoms include some shortness of breath related to pain.    Denies fever, chills, nausea, vomiting, weight loss or seeing any blood in her stool.  ER course: AST 111, ALT 110, Alk phos 733 (last LFTs 07/15/2021 show  an alk phos 325, AST 65 and ALT 46, total bilirubin 0.4), normal lipase and CBC, DJD of the hip with surgical changes of prior complex revision left total hip arthroplasty without evidence of hardware complication  GI history: 07/20/2021-see comprehensive hospitalization note from [facility] gastroenterology: Briefly patient had been hospitalized for hip arthroplasty and received NSAIDs for postop pain control, home PPI and Carafate was not continued and began with transient hypotension and hemoglobin trended of 6.6, multiple melanotic stools and episode of dark emesis, GI consulted with initial EGD 11/7 which revealed esophagitis, marginal ulcer, jejunal ulcers with severe mucosal changes (dusky, ischemic appearing), CTAP suggested colitis but was otherwise unremarkable, patient started on triple therapy with Protonix 40 twice daily, Carafate 1 g 4 times daily and Misoprostol 200 mcg 4 times daily.  Baric surgery was consulted for consideration of surgical intervention and discussed the patient would likely benefit from vagotomy, GJ revision or potentially arborize GB with reversal as she was severely malnourished at baseline with multiple comorbidities.  Her risk of complications was thought to be high and that she would not likely heal from any anastomosis show this was deferred.  Patient had recurrent GI bleeds and required total of 10 units of PRBCs and a short ICU stay with hemodynamic instability.  Repeat EGD 11/24 which had recurrent bleeding and multiple areas of bruising and clipped, repeat EGD 12/8 in order to obtain biopsies to determine if there is any  inflammatory or infectious cause of terrible mucosa, in addition to previous findings also demonstrated severe GJ stenosis and biopsies were not obtained, in order to rule out ischemic causes CT abdomen was performed and negative, started on TPN and continued at discharge, gradually stabilized and not have any more episodes of GI bleeding, hemoglobin stable  at 8.9 on day of discharge.  It was discussed that she would eventually require surgical intervention to address segment of small bowel and stenosis however this was to be pursued after she was nutritionally optimized which could take 2 to 3 months.  During admission she also had a repeat CT scan which showed possible early appendage situs and she was given 5 days of Cipro/Flagyl which resolved her pain.  Patient return to home with home health at discharge.  Past Medical History:  Diagnosis Date   History of stomach ulcers    Hypertension     Past Surgical History:  Procedure Laterality Date   GASTRIC BYPASS  2015   Family History: No GI history   Prior to Admission medications   Medication Sig Start Date End Date Taking? Authorizing Provider  acetaminophen (TYLENOL) 325 MG tablet Take 650 mg by mouth every 6 (six) hours as needed for mild pain or headache. 08/17/21  Yes [provider]  calcium carbonate (OS-CAL) 1250 (500 Ca) MG chewable tablet Chew 500 mg by mouth 3 (three) times daily as needed for heartburn.   Yes [provider]  divalproex (DEPAKOTE) 250 MG DR tablet Take 250 mg by mouth 2 (two) times daily. 08/17/21  Yes [provider]  DULoxetine (CYMBALTA) 30 MG capsule Take 30 mg by mouth daily. 08/17/21 08/17/22 Yes [provider]  ferrous sulfate 325 (65 FE) MG EC tablet Take 1 tablet by mouth daily with breakfast. 08/17/21  Yes [provider]  levothyroxine (SYNTHROID) 100 MCG tablet Take 100 mcg by mouth daily. 02/21/21 02/21/22 Yes [provider]  Magnesium Chloride 64 MG TBEC Take 535 mg by mouth in the morning and at bedtime. 08/17/21  Yes [provider]  methocarbamol (ROBAXIN) 500 MG tablet Take 1,000 mg by mouth every 8 (eight) hours as needed for muscle spasms.   Yes [provider]  metoprolol tartrate (LOPRESSOR) 25 MG tablet Take 25 mg by mouth in the morning and at bedtime. 04/28/21 04/28/22 Yes  [provider]  misoprostol (CYTOTEC) 200 MCG tablet Take 200 mcg by mouth 4 (four) times daily. 08/17/21  Yes [provider]  naloxone (NARCAN) nasal spray 4 mg/0.1 mL Place 0.4 mg into the nose as needed (accidental overdose). 02/09/20  Yes [provider]  ondansetron (ZOFRAN-ODT) 4 MG disintegrating tablet Take 4 mg by mouth every 8 (eight) hours as needed for nausea or vomiting. 08/17/21  Yes [provider]  oxyCODONE (OXY IR/ROXICODONE) 5 MG immediate release tablet Take 5 mg by mouth every 4 (four) hours as needed for moderate pain.   Yes [provider]  pantoprazole (PROTONIX) 40 MG tablet Take 40 mg by mouth 2 (two) times daily. 08/17/21  Yes [provider]  polyethylene glycol powder (GLYCOLAX/MIRALAX) 17 GM/SCOOP powder Take 17 g by mouth daily as needed for mild constipation. 08/17/21  Yes [provider]  prochlorperazine (COMPAZINE) 5 MG tablet Take 5 mg by mouth every 8 (eight) hours as needed for nausea or vomiting. 08/17/21  Yes [provider]  promethazine (PHENERGAN) 12.5 MG tablet Take 12.5 mg by mouth every 8 (eight) hours as needed for  nausea or vomiting. 08/17/21  Yes [provider]  QUEtiapine (SEROQUEL) 300 MG tablet Take 300 mg by mouth at bedtime. 08/17/21  Yes [provider]  senna-docusate (SENOKOT-S) 8.6-50 MG tablet Take 2 tablets by mouth 2 (two) times daily as needed for mild constipation. 08/17/21  Yes [provider]  spironolactone (ALDACTONE) 25 MG tablet Take 25 mg by mouth daily. 08/17/21  Yes [provider]  sucralfate (CARAFATE) 1 GM/10ML suspension Take 10 mLs by mouth 4 (four) times daily -  before meals and at bedtime. 08/17/21 02/13/22 Yes [provider]    Current Facility-Administered Medications  Medication Dose Route Frequency Provider Last Rate Last Admin   0.9 %  sodium chloride infusion  1,000 mL Intravenous Continuous Cardama, Grayce Sessions, MD  125 mL/hr at 08/20/21 0947 1,000 mL at 08/20/21 0947   Current Outpatient Medications  Medication Sig Dispense Refill   acetaminophen (TYLENOL) 325 MG tablet Take 650 mg by mouth every 6 (six) hours as needed for mild pain or headache.     calcium carbonate (OS-CAL) 1250 (500 Ca) MG chewable tablet Chew 500 mg by mouth 3 (three) times daily as needed for heartburn.     divalproex (DEPAKOTE) 250 MG DR tablet Take 250 mg by mouth 2 (two) times daily.     DULoxetine (CYMBALTA) 30 MG capsule Take 30 mg by mouth daily.     ferrous sulfate 325 (65 FE) MG EC tablet Take 1 tablet by mouth daily with breakfast.     levothyroxine (SYNTHROID) 100 MCG tablet Take 100 mcg by mouth daily.     Magnesium Chloride 64 MG TBEC Take 535 mg by mouth in the morning and at bedtime.     methocarbamol (ROBAXIN) 500 MG tablet Take 1,000 mg by mouth every 8 (eight) hours as needed for muscle spasms.     metoprolol tartrate (LOPRESSOR) 25 MG tablet Take 25 mg by mouth in the morning and at bedtime.     misoprostol (CYTOTEC) 200 MCG tablet Take 200 mcg by mouth 4 (four) times daily.     naloxone (NARCAN) nasal spray 4 mg/0.1 mL Place 0.4 mg into the nose as needed (accidental overdose).     ondansetron (ZOFRAN-ODT) 4 MG disintegrating tablet Take 4 mg by mouth every 8 (eight) hours as needed for nausea or vomiting.     oxyCODONE (OXY IR/ROXICODONE) 5 MG immediate release tablet Take 5 mg by mouth every 4 (four) hours as needed for moderate pain.     pantoprazole (PROTONIX) 40 MG tablet Take 40 mg by mouth 2 (two) times daily.     polyethylene glycol powder (GLYCOLAX/MIRALAX) 17 GM/SCOOP powder Take 17 g by mouth daily as needed for mild constipation.     prochlorperazine (COMPAZINE) 5 MG tablet Take 5 mg by mouth every 8 (eight) hours as needed for nausea or vomiting.     promethazine (PHENERGAN) 12.5 MG tablet Take 12.5 mg by mouth every 8 (eight) hours as needed for nausea or vomiting.     QUEtiapine  (SEROQUEL) 300 MG tablet Take 300 mg by mouth at bedtime.     senna-docusate (SENOKOT-S) 8.6-50 MG tablet Take 2 tablets by mouth 2 (two) times daily as needed for mild constipation.     spironolactone (ALDACTONE) 25 MG tablet Take 25 mg by mouth daily.     sucralfate (CARAFATE) 1 GM/10ML suspension Take 10 mLs by mouth 4 (four) times daily -  before meals and at bedtime.      Allergies  as of 08/20/2021 - Review Complete 08/20/2021  Allergen Reaction Noted   Lisinopril  05/11/2013    Review of Systems:    Constitutional: No weight loss, fever or chills Skin: No rash Cardiovascular: No chest pain Respiratory: No SOB  Gastrointestinal: See HPI and otherwise negative Genitourinary: No dysuria  Neurological: No headache, dizziness or syncope Musculoskeletal: No new muscle or joint pain Hematologic: No bleeding  Psychiatric: No history of depression or anxiety    Physical Exam:  Vital signs in last 24 hours: Temp:  [98 F (36.7 C)] 98 F (36.7 C) (01/08 0514) Pulse Rate:  [100-125] 113 (01/08 1015) Resp:  [16-21] 21 (01/08 1015) BP: (105-161)/(63-120) 146/86 (01/08 1015) SpO2:  [94 %-98 %] 97 % (01/08 1015) Weight:  [51.3 kg] 51.3 kg (01/08 0510)   General:   Pleasant thin appearing, pale, Caucasian female appears to be in NAD, alert and cooperative Head:  Normocephalic and atraumatic. Eyes:   PEERL, EOMI. No icterus. Conjunctiva pink. Ears:  Normal auditory acuity. Neck:  Supple Throat: Oral cavity and pharynx without inflammation, swelling or lesion.  Lungs: Respirations even and unlabored. Lungs clear to auscultation bilaterally.   No wheezes, crackles, or rhonchi.  Heart: Normal S1, S2. No MRG. Regular rate and rhythm. No peripheral edema, cyanosis or pallor.  Abdomen:  Soft, nondistended, marked epigastric TTP with some involuntary guarding. Normal bowel sounds. No appreciable masses or hepatomegaly. Rectal:  Not performed.  Msk:  Symmetrical without gross deformities.  Peripheral pulses intact. + TTP over left hip Extremities:  Without edema, no deformity or joint abnormality.  Neurologic:  Alert and  oriented x4;  grossly normal neurologically.  Skin:   Dry and intact without significant lesions or rashes. Psychiatric: Demonstrates good judgement and reason without abnormal affect or behaviors.   LAB RESULTS: Recent Labs    08/20/21 0707  WBC 3.3*  HGB 12.3  HCT 40.8  PLT 205   BMET Recent Labs    08/20/21 0707  NA 135  K 4.1  CL 100  CO2 28  GLUCOSE 119*  BUN 17  CREATININE 0.51  CALCIUM 9.5   LFT Recent Labs    08/20/21 0707  PROT 7.3  ALBUMIN 3.1*  AST 111*  ALT 110*  ALKPHOS 733*  BILITOT 0.3    STUDIES: CT ABDOMEN PELVIS W CONTRAST  Result Date: 08/20/2021 CLINICAL DATA:  Abdominal pain. EXAM: CT ABDOMEN AND PELVIS WITH CONTRAST TECHNIQUE: Multidetector CT imaging of the abdomen and pelvis was performed using the standard protocol following bolus administration of intravenous contrast. CONTRAST:  11m OMNIPAQUE IOHEXOL 350 MG/ML SOLN COMPARISON:  Hip XRs, concurrent. FINDINGS: Suboptimal evaluation, secondary to streak artifact from hip arthroplasty. Lower chest: Bibasilar pulmonary opacities and interstitial thickening. Hepatobiliary: Hepatomegaly, with Riedel's lobe measuring up to 22 cm. Intra-and-extrahepatic biliary dilation without discrete lesion. CBD measures up to 1.5 cm. Cholecystectomy. Pancreas: No pancreatic ductal dilatation or surrounding inflammatory changes. Spleen: Mild splenomegaly, measuring up to 13.5 cm Adrenals/Urinary Tract: Adrenal glands are unremarkable. Kidneys are normal, without renal calculi, focal lesion, or hydronephrosis. Bladder is unremarkable. Stomach/Bowel: Postsurgical changes of Roux-en-Y gastric bypass with intact-appearing staple lines in anastomosis. Nonobstructed bowel. Nondilated colon. Ingested radiodense tablets within stomach. Contrast opacification of distal small bowel and colon.  Appendix is not definitively visualized. No evidence of bowel wall thickening or inflammatory changes. Vascular/Lymphatic: Mild aortic atherosclerosis. No enlarged abdominal or pelvic lymph nodes. Reproductive: Status post hysterectomy. No adnexal masses. Other: No abdominal wall hernia. No abdominopelvic ascites. 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 LEFT hip arthroplasty and proximal femoral cerclage wires. No acute osseous findings. IMPRESSION: 1. Multifocal pneumonia. 2. Postsurgical changes of Roux-en-Y gastric bypass. Nonobstructed bowel. 3. Post cholecystectomy and intra-and extrahepatic biliary dilation without discrete lesion. Findings greater than expected for physiologic post cholecystectomy distention. If continued clinical concern, consider US Abdomen and MR abdomen/MRCP for further evaluation. 4. 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 5. Hepatosplenomegaly. Additional chronic and incidental findings, as above. Electronically Signed   By: Michaelle Birks M.D.   On: 08/20/2021 09:39   DG HIP UNILAT W OR W/O PELVIS 2-3 VIEWS LEFT  Result Date: 08/20/2021 CLINICAL DATA:  Left hip pain, recent surgery EXAM: DG HIP (WITH OR WITHOUT PELVIS) 2-3V LEFT COMPARISON:  None. FINDINGS: Evidence of prior complex revision left total hip arthroplasty. The femoral component appears located with respect to the acetabular component. The lateral buttress plate and cerclage wire construct appears intact. No evidence of periprosthetic fracture or other hardware related complication. Visualized bony pelvis is intact. Oral contrast material partially opacifies the colon. IMPRESSION: Surgical changes of prior complex revision left total hip arthroplasty without evidence of hardware complication. Electronically Signed   By: Jacqulynn Cadet M.D.   On: 08/20/2021 07:04   US Abdomen Limited RUQ (LIVER/GB)  Result Date: 08/20/2021 CLINICAL DATA:  56 year old  female with epigastric pain. Prior cholecystectomy. EXAM: ULTRASOUND ABDOMEN LIMITED RIGHT UPPER QUADRANT COMPARISON:  CT Abdomen and Pelvis 0913 hours today. FINDINGS: Gallbladder: Surgically absent. Common bile duct: Diameter: 11.3 cm, enlarged (up to 15 mm by CT this morning). No intraductal filling defect identified. Liver: Generalized intrahepatic biliary ductal dilatation as seen by CT. Portal vein is patent on color Doppler imaging with normal direction of blood flow towards the liver. Background liver parenchyma appears echogenic (images 20 and 34). No discrete liver lesion. But there is hepatomegaly as demonstrated by CT and on some images the liver surface appears mildly nodular (image 14). Other: Negative visible right kidney. No ascites. IMPRESSION: 1. Intra- and extrahepatic biliary ductal dilatation as seen by CT this morning. No intraductal filling defect identified by ultrasound. 2. Hepatomegaly with possible cirrhosis.  No discrete liver lesion. 3. Cholecystectomy. Electronically Signed   By: Genevie Ann M.D.   On: 08/20/2021 10:11     Impression / Plan:   Impression: 1.  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  Left hip pain: Known left hip replacement complicated by recurrent dislocations, hip x-ray without dislocation 3.  Elevated LFTs: Ultrasound and CT revealing mild increase in dilation of the bile duct but no stones, it appears patient's alk phos has been elevated recently during her hospitalization and she was started on TPN about a month and a half ago, no acute right upper quadrant pain, nausea or vomiting; likely TPN related, but will  consider choledocholithiasis  Plan: 1.  Ordered MRCP to evaluate for possible choledocholithiasis 2.  Continue to monitor LFTs 3.  Most likely patient's LFT elevation is from TPN over the past month and a half 4.  We will add Carafate 1 g 4 times daily and also allow her to have GI cocktail as needed every 4-6 hours for epigastric discomfort 5.  Started Pantoprazole 40 mg IV twice daily.  Discussed with patient that she can try crushing these at home in order to absorb them better. 6.  For now keep patient can continue on TPN. 7.  Discussed with patient that when she leaves the hospital it would be best for her to continue to follow with [facility] in regards to bariatric surgery and their plans going forward, but after all that we are happy to resume her care here if she wishes.  Thank you for your kind consultation, Dr. Hilarie Fredrickson will be taking over for our service tomorrow.  Lavone Nian Shayaan Parke  08/20/2021, 11:00 AM

## 2021-08-21 ENCOUNTER — Inpatient Hospital Stay (HOSPITAL_COMMUNITY): Payer: BC Managed Care – PPO

## 2021-08-21 DIAGNOSIS — R109 Unspecified abdominal pain: Secondary | ICD-10-CM

## 2021-08-21 DIAGNOSIS — K838 Other specified diseases of biliary tract: Secondary | ICD-10-CM | POA: Insufficient documentation

## 2021-08-21 DIAGNOSIS — K759 Inflammatory liver disease, unspecified: Secondary | ICD-10-CM | POA: Insufficient documentation

## 2021-08-21 LAB — CBC WITH DIFFERENTIAL/PLATELET
Abs Immature Granulocytes: 0.02 10*3/uL (ref 0.00–0.07)
Basophils Absolute: 0 10*3/uL (ref 0.0–0.1)
Basophils Relative: 0 %
Eosinophils Absolute: 0.1 10*3/uL (ref 0.0–0.5)
Eosinophils Relative: 2 %
HCT: 27.9 % — ABNORMAL LOW (ref 36.0–46.0)
Hemoglobin: 8.6 g/dL — ABNORMAL LOW (ref 12.0–15.0)
Immature Granulocytes: 0 %
Lymphocytes Relative: 30 %
Lymphs Abs: 1.4 10*3/uL (ref 0.7–4.0)
MCH: 28.4 pg (ref 26.0–34.0)
MCHC: 30.8 g/dL (ref 30.0–36.0)
MCV: 92.1 fL (ref 80.0–100.0)
Monocytes Absolute: 0.4 10*3/uL (ref 0.1–1.0)
Monocytes Relative: 8 %
Neutro Abs: 2.7 10*3/uL (ref 1.7–7.7)
Neutrophils Relative %: 60 %
Platelets: 171 10*3/uL (ref 150–400)
RBC: 3.03 MIL/uL — ABNORMAL LOW (ref 3.87–5.11)
RDW: 14.3 % (ref 11.5–15.5)
WBC: 4.6 10*3/uL (ref 4.0–10.5)
nRBC: 0 % (ref 0.0–0.2)

## 2021-08-21 LAB — COMPREHENSIVE METABOLIC PANEL
ALT: 112 U/L — ABNORMAL HIGH (ref 0–44)
AST: 105 U/L — ABNORMAL HIGH (ref 15–41)
Albumin: 2 g/dL — ABNORMAL LOW (ref 3.5–5.0)
Alkaline Phosphatase: 405 U/L — ABNORMAL HIGH (ref 38–126)
Anion gap: 5 (ref 5–15)
BUN: 16 mg/dL (ref 6–20)
CO2: 25 mmol/L (ref 22–32)
Calcium: 8.5 mg/dL — ABNORMAL LOW (ref 8.9–10.3)
Chloride: 101 mmol/L (ref 98–111)
Creatinine, Ser: 0.42 mg/dL — ABNORMAL LOW (ref 0.44–1.00)
GFR, Estimated: >60 mL/min (ref >60)
Glucose, Bld: 79 mg/dL (ref 70–99)
Potassium: 3.9 mmol/L (ref 3.5–5.1)
Sodium: 131 mmol/L — ABNORMAL LOW (ref 135–145)
Total Bilirubin: 0.5 mg/dL (ref 0.3–1.2)
Total Protein: 5.1 g/dL — ABNORMAL LOW (ref 6.5–8.1)

## 2021-08-21 LAB — HIV ANTIBODY (ROUTINE TESTING W REFLEX): HIV Screen 4th Generation wRfx: NONREACTIVE

## 2021-08-21 LAB — TSH: TSH: 2.493 u[IU]/mL (ref 0.350–4.500)

## 2021-08-21 MED ORDER — SUCRALFATE 1 GM/10ML PO SUSP
1.0000 g | Freq: Four times a day (QID) | ORAL | Status: DC
  Administered 2021-08-21 – 2021-08-23 (×9): 1 g via ORAL
  Filled 2021-08-21 (×9): qty 10

## 2021-08-21 MED ORDER — CHLORHEXIDINE GLUCONATE CLOTH 2 % EX PADS
6.0000 | MEDICATED_PAD | Freq: Every day | CUTANEOUS | Status: DC
  Administered 2021-08-21 – 2021-08-22 (×2): 6 via TOPICAL

## 2021-08-21 MED ORDER — SODIUM CHLORIDE 0.9% FLUSH: 10.0000 mL | INTRAVENOUS | Status: DC | PRN

## 2021-08-21 MED ORDER — SODIUM CHLORIDE 0.9% FLUSH
10.0000 mL | Freq: Two times a day (BID) | INTRAVENOUS | Status: DC
  Administered 2021-08-22 (×2): 10 mL

## 2021-08-21 NOTE — Progress Notes (Addendum)
Daily Rounding Note  08/21/2021, 10:19 AM  LOS: 1 day   SUBJECTIVE:   Chief complaint:    epigastric pain, nausea w/O vomiting.    Ill having burning epigastric pain similar to what she [facility].  No passing of blood  OBJECTIVE:         Vital signs in last 24 hours:    Pulse Rate:  [77-111] 83 (01/09 0922) Resp:  [14-19] 15 (01/09 0922) BP: (98-144)/(62-87) 140/76 (01/09 0922) SpO2:  [92 %-98 %] 94 % (01/09 0922)   Filed Weights   08/20/21 0510  Weight: 51.3 kg   General: Looks chronically ill, pale, comfortable, alert Heart: RRR. Chest: No labored breathing or cough.  Did not auscultate chest. Abdomen: Soft, nontender.  No distention.  Bowel sounds active.  No hernias or masses. Extremities: No CCE. Neuro/Psych: Alert.  Appropriate.  Good historian.  Oriented x3.  Moves all 4 limbs without tremor, strength not tested  Intake/Output from previous day: 01/08 0701 - 01/09 0700 In: 2000 [IV Piggyback:2000] Out: -   Intake/Output this shift: No intake/output data recorded.  Lab Results: Recent Labs    08/20/21 0707 08/21/21 0701  WBC 3.3* 4.6  HGB 12.3 8.6*  HCT 40.8 27.9*  PLT 205 171   BMET Recent Labs    08/20/21 0707 08/21/21 0530  NA 135 131*  K 4.1 3.9  CL 100 101  CO2 28 25  GLUCOSE 119* 79  BUN 17 16  CREATININE 0.51 0.42*  CALCIUM 9.5 8.5*   LFT Recent Labs    08/20/21 0707 08/21/21 0530  PROT 7.3 5.1*  ALBUMIN 3.1* 2.0*  AST 111* 105*  ALT 110* 112*  ALKPHOS 733* 405*  BILITOT 0.3 0.5   PT/INR No results for input(s): LABPROT, INR in the last 72 hours. Hepatitis Panel No results for input(s): HEPBSAG, HCVAB, HEPAIGM, HEPBIGM in the last 72 hours.  Studies/Results: CT ABDOMEN PELVIS W CONTRAST  Result Date: 08/20/2021 CLINICAL DATA:  Abdominal pain. EXAM: CT ABDOMEN AND PELVIS WITH CONTRAST TECHNIQUE:  Multidetector CT imaging of the abdomen and pelvis was performed using the standard protocol following bolus administration of intravenous contrast. CONTRAST:  1082mOMNIPAQUE IOHEXOL 350 MG/ML SOLN COMPARISON:  Hip XRs, concurrent. FINDINGS: Suboptimal evaluation, secondary to streak artifact from hip arthroplasty. Lower chest: Bibasilar pulmonary opacities and interstitial thickening. Hepatobiliary: Hepatomegaly, with Riedel's lobe measuring up to 22 cm. Intra-and-extrahepatic biliary dilation without discrete lesion. CBD measures up to 1.5 cm. Cholecystectomy. Pancreas: No pancreatic ductal dilatation or surrounding inflammatory changes. Spleen: Mild splenomegaly, measuring up to 13.5 cm Adrenals/Urinary Tract: Adrenal glands are unremarkable. Kidneys are normal, without renal calculi, focal lesion, or hydronephrosis. Bladder is unremarkable. Stomach/Bowel: Postsurgical changes of Roux-en-Y gastric bypass with intact-appearing staple lines in anastomosis. Nonobstructed bowel. Nondilated colon. Ingested radiodense tablets within stomach. Contrast opacification of distal small bowel and colon. Appendix is not definitively visualized. No evidence of bowel wall thickening or inflammatory changes. Vascular/Lymphatic: Mild aortic atherosclerosis. No enlarged abdominal or pelvic lymph nodes. Reproductive: Status post hysterectomy. No adnexal masses. Other: No abdominal wall hernia. No abdominopelvic ascites. 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 LEFT hip arthroplasty and proximal femoral cerclage wires. No acute osseous findings. IMPRESSION: 1. Multifocal pneumonia. 2. Postsurgical changes of Roux-en-Y gastric bypass. Nonobstructed bowel. 3. Post cholecystectomy and intra-and extrahepatic biliary dilation without discrete lesion. Findings greater than expected for physiologic  post cholecystectomy distention. If continued clinical concern, consider US Abdomen and MR  abdomen/MRCP for further evaluation. 4. 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 5. Hepatosplenomegaly. Additional chronic and incidental findings, as above. Electronically Signed   By: Michaelle Birks M.D.   On: 08/20/2021 09:39   MR ABDOMEN MRCP W WO CONTAST  Result Date: 08/21/2021 CLINICAL DATA:  56 year old female with history of right upper quadrant abdominal pain. Biliary tract dilatation noted on ultrasound examination. Evaluate for choledocholithiasis. EXAM: MRI ABDOMEN WITHOUT AND WITH CONTRAST (INCLUDING MRCP) TECHNIQUE: Multiplanar multisequence MR imaging of the abdomen was performed both before and after the administration of intravenous contrast. Heavily T2-weighted images of the biliary and pancreatic ducts were obtained, and three-dimensional MRCP images were rendered by post processing. CONTRAST:  38m GADAVIST GADOBUTROL 1 MMOL/ML IV SOLN COMPARISON:  No prior abdominal MRI. CT the abdomen and pelvis 08/20/2021. Abdominal ultrasound 08/20/2021. FINDINGS: Comment: Today's study is severely limited for detection and characterization of visceral and/or vascular lesions by extensive patient respiratory motion. Lower chest: Patchy areas of increased signal intensity throughout the visualize lung bases corresponding to areas of airspace consolidation on recent CT examination, most severe in the left lower lobe. Small left-sided pleural effusion. Hepatobiliary: Liver is enlarged measuring up to 20.9 cm in craniocaudal span. No discrete cystic or solid hepatic lesions are confidently identified on today's motion limited examination. Status post cholecystectomy. MRCP images demonstrate severe intra and extrahepatic biliary ductal dilatation. There is no filling defect within the common bile duct to suggest the presence of choledocholithiasis. Pancreas: No pancreatic mass confidently identified on today's motion limited examination. The main pancreatic duct is diffusely ectatic (3 mm) and  empties via the minor papilla. The ventral pancreatic duct is normal in caliber, and enters at the major papilla in conjunction with the common bile duct (i.e., there is pancreatic divisum anatomy). No peripancreatic fluid collections or inflammatory changes. Spleen: Spleen is enlarged measuring 11.8 x 9.2 x 11.2 cm (estimated splenic volume of 608 mL) . Adrenals/Urinary Tract: Bilateral kidneys and adrenal glands are normal in appearance. No hydroureteronephrosis in the visualized portions of the abdomen. Stomach/Bowel: Signal void in the region of the stomach and bowel related to prior Roux-en-Y gastric bypass. Vascular/Lymphatic: No aneurysm identified in the visualized abdominal vasculature. No lymphadenopathy noted in the abdomen. Other: No significant volume of ascites noted in the visualized portions of the peritoneal cavity. Musculoskeletal: No aggressive appearing osseous lesions are noted in the visualized portions of the skeleton. IMPRESSION: 1. Severe intra and extrahepatic biliary ductal dilatation without evidence of obstructing choledocholithiasis or obstructing mass in the head of the pancreas. This may simply reflect very profound benign post cholecystectomy physiologic ductal dilatation. However, if there is biochemical evidence of biliary tract obstruction, further evaluation with endoscopic ultrasound should be considered to exclude the possibility of a very small mass at the level of the ampulla not visualized on today's limited MRI/MRCP examination. 2. Pancreatic divisum anatomy is noted. 3. Multilobar pneumonia noted in the visualize lung bases, most severe in the left lower lobe where there is a adjacent small left parapneumonic pleural effusion. 4. Hepatosplenomegaly. Electronically Signed   By: DVinnie LangtonM.D.   On: 08/21/2021 05:20   DG HIP UNILAT W OR W/O PELVIS 2-3 VIEWS LEFT  Result Date: 08/20/2021 CLINICAL DATA:  Left hip pain, recent surgery EXAM: DG HIP (WITH OR WITHOUT  PELVIS) 2-3V LEFT COMPARISON:  None. FINDINGS: Evidence of prior complex revision left total hip arthroplasty. The femoral component  appears located with respect to the acetabular component. The lateral buttress plate and cerclage wire construct appears intact. No evidence of periprosthetic fracture or other hardware related complication. Visualized bony pelvis is intact. Oral contrast material partially opacifies the colon. IMPRESSION: Surgical changes of prior complex revision left total hip arthroplasty without evidence of hardware complication. Electronically Signed   By: Jacqulynn Cadet M.D.   On: 08/20/2021 07:04   US Abdomen Limited RUQ (LIVER/GB)  Result Date: 08/20/2021 CLINICAL DATA:  56 year old female with epigastric pain. Prior cholecystectomy. EXAM: ULTRASOUND ABDOMEN LIMITED RIGHT UPPER QUADRANT COMPARISON:  CT Abdomen and Pelvis 0913 hours today. FINDINGS: Gallbladder: Surgically absent. Common bile duct: Diameter: 11.3 cm, enlarged (up to 15 mm by CT this morning). No intraductal filling defect identified. Liver: Generalized intrahepatic biliary ductal dilatation as seen by CT. Portal vein is patent on color Doppler imaging with normal direction of blood flow towards the liver. Background liver parenchyma appears echogenic (images 20 and 34). No discrete liver lesion. But there is hepatomegaly as demonstrated by CT and on some images the liver surface appears mildly nodular (image 14). Other: Negative visible right kidney. No ascites. IMPRESSION: 1. Intra- and extrahepatic biliary ductal dilatation as seen by CT this morning. No intraductal filling defect identified by ultrasound. 2. Hepatomegaly with possible cirrhosis.  No discrete liver lesion. 3. Cholecystectomy. Electronically Signed   By: Genevie Ann M.D.   On: 08/20/2021 10:11    Scheduled Meds:  divalproex  250 mg Oral BID   levothyroxine  100 mcg Oral Q0600   metoprolol tartrate  25 mg Oral BID   misoprostol  200 mcg Oral Q6H    pantoprazole (PROTONIX) IV  40 mg Intravenous BID AC   QUEtiapine  300 mg Oral QHS   senna  1 tablet Oral BID   spironolactone  25 mg Oral Daily   sucralfate  1 g Oral TID WC & HS   Continuous Infusions:  sodium chloride 1,000 mL (08/20/21 0947)   PRN Meds:.acetaminophen **OR** acetaminophen, alum & mag hydroxide-simeth **AND** lidocaine, HYDROmorphone (DILAUDID) injection, ondansetron **OR** ondansetron (ZOFRAN) IV, oxyCODONE   ASSESMENT:     Elevated LFTs.  Chronic epigastric pain.  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  LFTs are improving. 06/19/21 enteroscopy for UGI bleed: Esophagitis, marginal ulcer.  Jejunal ulcers with dusky, ischemic appearance.  Treated with Protonix bid, Carafate qid (not taking as insurance not covering but only home x 1 d before current admission), misoprostol qid.   07/06/2021 EGD.  Multiple areas of bruising were clipped. 07/20/2021 EGD with biopsies.  Unable to unearth path report.  Showed severe GJ stenosis, diseased mucosa.  Suspected need for future surgery (vagotomy, GJ revision, potential RY GB reversal) deferred surgery to optimize nutrition over at least 2 months.  TPN initiated by [facility] provider in 12/22 11/22 colonoscopy, for CT suggesting colitis: unremarkable. 08/20/2020 CTAP w contrast: Surgical changes post RYGP.  S/p cholecystectomy, intra/extrahepatic biliary ductal dilatation, CBD up to 1.5 mm.  Hepatomegaly. 08/20/2020 ultrasound showing intra and extrahepatic biliary ductal dilatation.  Hepatomegaly, possible cirrhosis, no distinct liver lesion 05/20/2021 MRI/MRCP.  Severe intra/extrahepatic biliary ductal dilatation without obstructing choledocholithiasis or mass.  Changes may reflect benign but profound post cholecystectomy physiologic ductal dilatation.  Pancreatic divisum anatomy.  Hepatosplenomegaly.  Multi lobar pneumonia. Note that CT of 11/13 showed "enlarging hepatic and intrahepatic bile ducts".  [facility] performed ultrasound liver 07/09/2021  for elevated LFTs.  It revealed CBD of 1.4 cm, postcholecystectomy anatomy, mild intrahepatic bile duct  dilatation.  Small perihepatic ascites.  Severe malnutrition.  TNA since ~ 06/21/21 which can cause elevated LFTs.  Tolerates little po.  Prealbumin 22.7, low normal as of a week ago.  Multifocal pneumonia.  Normal WBCs. No fevers.    No current abx.    Recurrent hip fractures, dislocations, hip replacement.  Ambulatory with a walker.    Anemia.  Hgb 12.6 >> 8.6 over 24 h. Was 8.9 on 1/5, 4 d ago. Required innumerable PRBCs during 2 month [facility] admission ending 08/17/21.       Hx ascites.  1.3 L paracentesis 06/30/21.  No SBP.     Chronic pain, taking signif doses of narcotics regularly at home for at least a year w k=new addition of dilaudid.     PLAN     Will d/w Dr Hilarie Fredrickson.  Repeat EGD??      Bump up schedule narcotics to reflect home/outpt dosing.      Azucena Freed  08/21/2021, 10:19 AM Phone [phone]

## 2021-08-21 NOTE — ED Notes (Signed)
Admitting MD team made aware of pts decreased Hgb level.

## 2021-08-21 NOTE — Progress Notes (Signed)
HD#1 SUBJECTIVE:  Patient Summary: Amanda Hendricks is a 56 y.o. F with past medical history of gastric bypass surgery in 2015, cholecystectomy, Cushing disease, osteoporosis, hypertension, hypothyroidism s/p R thyroid lobectomy in 2018, bipolar 1 disorder who presents with increased abdominal pain admitted for possible choledocholithiasis.   Overnight Events: Acute drop in hemoglobin from 12.3 to 8.6.   Interim History: Patient reports continued pain in the left hip.  She denies significant pain in her abdomen, has had no nausea/vomiting, GI distress.  OBJECTIVE:  Vital Signs: Vitals:   08/21/21 0638 08/21/21 0919 08/21/21 0922 08/21/21 1230  BP: 98/63 140/76 140/76 129/73  Pulse: 88 83 83 84  Resp: 15  15 17   Temp:      TempSrc:      SpO2: 92%  94% 94%  Weight:      Height:       Supplemental O2: Room Air SpO2: 94 %  Filed Weights   08/20/21 0510  Weight: 51.3 kg    No intake or output data in the 24 hours ending 08/21/21 1318 Net IO Since Admission: 2,000 mL [08/21/21 1318]  Physical Exam: Constitutional: No acute distress noted. Cardio: Regular rate and rhythm.  No murmurs, rubs, gallops. Pulm: Normal work of breathing on room air. Abdomen: Involuntary guarding present.  Patient denies tenderness to palpation.  Denies recent bowel movements, nausea, vomiting. MSK: Negative for extremity edema.  Point tenderness over left greater trochanteric area. Skin: Skin is warm and dry. Neuro: Alert and oriented x3.  No focal deficit noted. Psych: Normal affect.  Patient Lines/Drains/Airways Status     Active Line/Drains/Airways     Name Placement date Placement time Site Days   Peripheral IV 08/20/21 22 G Left;Posterior Hand 08/20/21  1761  Hand  1             ASSESSMENT/PLAN:  Assessment: Principal Problem:   Abdominal pain   Plan: Amanda Hendricks is a 56 y.o. F with past medical history of gastric bypass surgery in 2015, cholecystectomy, Cushing disease,  osteoporosis, hypertension, hypothyroidism s/p R thyroid lobectomy in 2018, bipolar 1 disorder who presents with increased abdominal pain admitted for possible choledocholithiasis.    #Abdominal pain #S/p Roux-en-Y, 2015 Patient has a history of GI bleeds discovered during recent prolonged hospitalization as well as history of duodenal stricturing, cholecystectomy, and Roux-en-Y procedure in 2015. Home management includes pantoprazole 40 mg twice daily and misoprostol.  Hepatic function labs showed all overall improvement overnight.  Leukocytosis is resolved at this time. -GI following, appreciate their assistance             -MRCP significant for: Severe intra and extrahepatic biliary ductal dilatation without evidence of obstructing choledocholithiasis or mass in the head of the pancreas; pancreatic divisum anatomy; multilobar pneumonia most severe in the left lower lobe with adjacent small left parapneumonic pleural effusion; hepatosplenomegaly.             -Continue Protonix 40 mg IV twice daily, misoprostol 200 mcg every 6 hours, Carafate 1 g 3 times daily with meals and at bedtime             -Follow-up recommendations now that MRCP has been completed -Zofran 4 mg every 6 hours as needed nausea -Scheduled senna twice daily for constipation present on admission -NS 125 cc/hr   #Left hip pain Patient states that after recent hip surgery she did not undergo physical therapy and use of the limb has been limited secondary to pain for  several months.  She continues to endorse significant pain in this area particularly over the greater trochanter.  Pain is worse on internal rotation.  Fortunately x-ray on admission did not show any evidence of hardware complication. -Pain management: Dilaudid 1 mg IV every 4 hours as needed severe pain, oxycodone 5 mg every 4 hours as needed moderate pain -PT/OT consulted   #Hypertension Blood pressure measurements have been largely normotensive to this point.  She  is managed with spironolactone and metoprolol at home. -Continue home spironolactone 25 mg daily, metoprolol 25 mg twice daily   #Hypothyroidism #History of papillary microcarcinoma of the thyroid, 2018 #S/p right thyroid lobectomy, 2018 TSH on admission of 2.43.  Patient is managed with levothyroxine 100 mcg daily. -Continue levothyroxine 100 mcg daily   #Bipolar 1 disorder Managed with valproic acid 250 mg twice daily, Seroquel 300 mg daily. -Continue valproic acid 250 mg twice daily, Seroquel 300 mg daily  Best Practice: Diet: N.p.o. IVF: Fluids: 0.9NS, Rate:  125 cc/hour VTE: Place and maintain sequential compression device Start: 08/21/21 0757 Code: Full AB: None DISPO: Anticipated discharge  1-3   pending Medical stability.  Signature: Farrel Gordon, D.O.  Internal Medicine Resident, PGY-1 [facility]  Pager: [phone] 1:18 PM, 08/21/2021   Please contact the on call pager after 5 pm and on weekends at [phone].

## 2021-08-21 NOTE — H&P (Addendum)
Date: 08/21/2021               Patient Name:  Amanda Hendricks MRN: [id number]  DOB: 28-May-1966 Age / Sex: 56 y.o., female   PCP: Riki Rusk, MD         Medical Service: Internal Medicine Teaching Service         Attending Physician: Dr. Evette Doffing, Mallie Mussel, *    First Contact: Dr. Marlou Sa Pager: [phone]  Second Contact: Dr. Collene Gobble Pager: [phone]       After Hours (After 5p/  First Contact Pager: [phone]  weekends / holidays): Second Contact Pager: [phone]   Chief Complaint: abdominal pain  History of Present Illness: Amanda Hendricks is a 56 y.o. F with past medical history of gastric bypass surgery in 2015, cholecystectomy, Cushing disease, osteoporosis, hypothyroidism, who presents with increased abdominal and left hip pain that began on 01/06. The patient was recently admitted to [facility] for 2 months after suffering a hip location and this hospital stay was complicated by recurrent GI bleeds, marginal ulcer that led to reported stenosis. She has been on TPN since November with evidence of elevated hepatic enzymes over these last several months. She is currently awaiting reversal of Roux-en-Y. She says that she returned home on 01/05 and that her pain returned on 01/06. The pain is both in her L hip as well as abdomen. She states that she is able to tolerate small amounts of broth at a time but otherwise is not able to tolerate PO intake. She has not had a bowel movement in 5 days. No urinary complaints.   She denies fevers, chills, hematemesis, hematochezia. She endorses nausea, shortness of breath associated with pain episodes.  ED Course: CBC showed leukopenia at 3.3.  CMP remarkable for hypoalbuminemia 3.1, elevated AST and ALT at 111 and 110 respectively, elevated alk phos at 733.  Lipase and lactic acid both within normal limits.  X-ray left hip without evidence of hardware complication.  CT abdomen/pelvis not significant for bowel obstruction; intra and  extrahepatic biliary dilation without discrete lesion; chronic T10-T11 vertebral compression with gibbus deformity; hepatosplenomegaly.  RUQ ultrasound again showed intra and extrahepatic biliary ductal dilatation without intraductal filling defect, hepatomegaly with possible cirrhosis.  Meds:  Current Meds  Medication Sig   acetaminophen (TYLENOL) 325 MG tablet Take 650 mg by mouth every 6 (six) hours as needed for mild pain or headache.   calcium carbonate (OS-CAL) 1250 (500 Ca) MG chewable tablet Chew 500 mg by mouth 3 (three) times daily as needed for heartburn.   divalproex (DEPAKOTE) 250 MG DR tablet Take 250 mg by mouth 2 (two) times daily.   DULoxetine (CYMBALTA) 30 MG capsule Take 30 mg by mouth daily.   ferrous sulfate 325 (65 FE) MG EC tablet Take 1 tablet by mouth daily with breakfast.   levothyroxine (SYNTHROID) 100 MCG tablet Take 100 mcg by mouth daily.   Magnesium Chloride 64 MG TBEC Take 535 mg by mouth in the morning and at bedtime.   methocarbamol (ROBAXIN) 500 MG tablet Take 1,000 mg by mouth every 8 (eight) hours as needed for muscle spasms.   metoprolol tartrate (LOPRESSOR) 25 MG tablet Take 25 mg by mouth in the morning and at bedtime.   misoprostol (CYTOTEC) 200 MCG tablet Take 200 mcg by mouth 4 (four) times daily.   naloxone (NARCAN) nasal spray 4 mg/0.1 mL Place 0.4 mg into the nose as needed (accidental overdose).   ondansetron (  ZOFRAN-ODT) 4 MG disintegrating tablet Take 4 mg by mouth every 8 (eight) hours as needed for nausea or vomiting.   oxyCODONE (OXY IR/ROXICODONE) 5 MG immediate release tablet Take 5 mg by mouth every 4 (four) hours as needed for moderate pain.   pantoprazole (PROTONIX) 40 MG tablet Take 40 mg by mouth 2 (two) times daily.   polyethylene glycol powder (GLYCOLAX/MIRALAX) 17 GM/SCOOP powder Take 17 g by mouth daily as needed for mild constipation.   prochlorperazine (COMPAZINE) 5 MG tablet Take 5 mg by mouth every 8 (eight) hours as needed for  nausea or vomiting.   promethazine (PHENERGAN) 12.5 MG tablet Take 12.5 mg by mouth every 8 (eight) hours as needed for nausea or vomiting.   QUEtiapine (SEROQUEL) 300 MG tablet Take 300 mg by mouth at bedtime.   senna-docusate (SENOKOT-S) 8.6-50 MG tablet Take 2 tablets by mouth 2 (two) times daily as needed for mild constipation.   spironolactone (ALDACTONE) 25 MG tablet Take 25 mg by mouth daily.   sucralfate (CARAFATE) 1 GM/10ML suspension Take 10 mLs by mouth 4 (four) times daily -  before meals and at bedtime.   [DISCONTINUED] melatonin 3 MG TABS tablet Take 6 mg by mouth at bedtime.   [DISCONTINUED] nicotine (NICODERM CQ - DOSED IN MG/24 HR) 7 mg/24hr patch Place 7 mg onto the skin daily.     Allergies: Allergies as of 08/20/2021 - Review Complete 08/20/2021  Allergen Reaction Noted   Lisinopril  05/11/2013   Past Medical History:  Diagnosis Date   History of stomach ulcers    Hypertension     Family History: Negative for pertinent family history.  Social History: Patient lives with her mom who helps to care for her, as well as her son who cares for both her and her mom.   Review of Systems: A complete ROS was negative except as per HPI.   Physical Exam: Blood pressure 129/73, pulse 84, temperature 98 F (36.7 C), temperature source Oral, resp. rate 17, height 5' 7"  (1.702 m), weight 51.3 kg, SpO2 94 %. Constitutional: Chronically ill appearing female, in no acute distress. Eyes: Non-icteric. Cardio: Tachycardic with regular rhythm. No murmurs, rubs, gallops. Pulm: Crackles present in bilateral lung bases. Normal work of breathing on room air. Abdomen: Tenderness most pronounced in RUQ, RLQ. Diffusely guarded. MSK: Negative for extremity edema. Distal pulses normal. Skin: Skin is warm and dry. Neuro: Alert and oriented x3. No focal deficit noted. Psych: Normal mood and affect.  EKG: personally reviewed my interpretation is sinus tachycardia with evidence of left  ventricular hypertrophy.  Assessment & Plan by Problem: Principal Problem:   Abdominal pain  Amanda Hendricks is a 56 y.o. F with past medical history of gastric bypass surgery in 2015, cholecystectomy, Cushing disease, osteoporosis, hypertension, hypothyroidism s/p R thyroid lobectomy in 2018, bipolar 1 disorder who presents with increased abdominal pain admitted for possible choledocholithiasis.   #Abdominal pain #S/p Roux-en-Y, 2015 Patient has a history of GI bleeds during recent hospitalization as well as history of duodenal stricturing, cholecystectomy, and Roux-en-Y procedure in 2015. Home management includes pantoprazole 40 mg twice daily and misoprostol.  -GI following, appreciate their assistance  -MRCP ordered  -Continue Protonix 40 mg IV twice daily, misoprostol 200 mcg every 6 hours; resume Carafate 1 g 3 times daily with meals and at bedtime  -Consider repeat EGD if symptoms persist despite medical therapy if MRCP is negative -Zofran 4 mg every 6 hours as needed nausea -Scheduled senna twice daily  for constipation present on admission -NS 125 cc/hr  #Left hip pain Patient was recently discharged from [facility] where she had a prolonged stay secondary to recent hip dislocation.  She does complain of significant pain in this area on presentation.  Fortunately x-ray did not show any evidence of hardware complication. -Pain management: Dilaudid 1 mg IV every 4 hours as needed severe pain, oxycodone 5 mg every 4 hours as needed moderate pain -PT/OT  #Hypertension Blood pressure measurements have been largely normotensive to this point.  She is managed with spironolactone and metoprolol at home. -Continue home spironolactone 25 mg daily, metoprolol 25 mg twice daily  #Hypothyroidism #History of papillary microcarcinoma of the thyroid, 2018 #S/p right thyroid lobectomy, 2018 Most recent thyroid panel in November showed TSH at upper limit of normal, 5.65 with free T4 of 0.81.   Patient is managed with levothyroxine 100 mcg daily. -Recheck TSH -Continue levothyroxine 100 mcg daily  #Bipolar 1 disorder Managed with valproic acid 250 mg twice daily, Seroquel 300 mg daily. -Continue valproic acid 250 mg twice daily, Seroquel 300 mg daily   Dispo: Admit patient to Inpatient with expected length of stay greater than 2 midnights.  Signed: Farrel Gordon, DO 08/21/2021, 1:18 PM  Pager: [phone] After 5pm on weekdays and 1pm on weekends: On Call pager: [phone]

## 2021-08-21 NOTE — ED Notes (Signed)
Pt given water per request

## 2021-08-22 DIAGNOSIS — M25552 Pain in left hip: Secondary | ICD-10-CM

## 2021-08-22 LAB — CBC WITH DIFFERENTIAL/PLATELET
Abs Immature Granulocytes: 0.01 10*3/uL (ref 0.00–0.07)
Basophils Absolute: 0 10*3/uL (ref 0.0–0.1)
Basophils Relative: 1 %
Eosinophils Absolute: 0 10*3/uL (ref 0.0–0.5)
Eosinophils Relative: 1 %
HCT: 29.7 % — ABNORMAL LOW (ref 36.0–46.0)
Hemoglobin: 9.3 g/dL — ABNORMAL LOW (ref 12.0–15.0)
Immature Granulocytes: 0 %
Lymphocytes Relative: 20 %
Lymphs Abs: 0.8 10*3/uL (ref 0.7–4.0)
MCH: 28.4 pg (ref 26.0–34.0)
MCHC: 31.3 g/dL (ref 30.0–36.0)
MCV: 90.8 fL (ref 80.0–100.0)
Monocytes Absolute: 0.4 10*3/uL (ref 0.1–1.0)
Monocytes Relative: 10 %
Neutro Abs: 2.7 10*3/uL (ref 1.7–7.7)
Neutrophils Relative %: 68 %
Platelets: 211 10*3/uL (ref 150–400)
RBC: 3.27 MIL/uL — ABNORMAL LOW (ref 3.87–5.11)
RDW: 13.8 % (ref 11.5–15.5)
WBC: 3.9 10*3/uL — ABNORMAL LOW (ref 4.0–10.5)
nRBC: 0 % (ref 0.0–0.2)

## 2021-08-22 LAB — COMPREHENSIVE METABOLIC PANEL
ALT: 98 U/L — ABNORMAL HIGH (ref 0–44)
AST: 76 U/L — ABNORMAL HIGH (ref 15–41)
Albumin: 2.2 g/dL — ABNORMAL LOW (ref 3.5–5.0)
Alkaline Phosphatase: 519 U/L — ABNORMAL HIGH (ref 38–126)
Anion gap: 8 (ref 5–15)
BUN: 10 mg/dL (ref 6–20)
CO2: 22 mmol/L (ref 22–32)
Calcium: 8.2 mg/dL — ABNORMAL LOW (ref 8.9–10.3)
Chloride: 98 mmol/L (ref 98–111)
Creatinine, Ser: 0.46 mg/dL (ref 0.44–1.00)
GFR, Estimated: >60 mL/min (ref >60)
Glucose, Bld: 58 mg/dL — ABNORMAL LOW (ref 70–99)
Potassium: 3.5 mmol/L (ref 3.5–5.1)
Sodium: 128 mmol/L — ABNORMAL LOW (ref 135–145)
Total Bilirubin: 0.6 mg/dL (ref 0.3–1.2)
Total Protein: 5.4 g/dL — ABNORMAL LOW (ref 6.5–8.1)

## 2021-08-22 LAB — FOLATE: Folate: 28 ng/mL (ref >5.9)

## 2021-08-22 LAB — VITAMIN D 25 HYDROXY (VIT D DEFICIENCY, FRACTURES): Vit D, 25-Hydroxy: 40.8 ng/mL (ref 30–100)

## 2021-08-22 LAB — VITAMIN B12: Vitamin B-12: 572 pg/mL (ref 180–914)

## 2021-08-22 MED ORDER — OXYCODONE HCL 5 MG PO TABS
5.0000 mg | ORAL_TABLET | ORAL | Status: DC
  Administered 2021-08-22 – 2021-08-23 (×7): 5 mg via ORAL
  Filled 2021-08-22 (×7): qty 1

## 2021-08-22 MED ORDER — PANTOPRAZOLE SODIUM 40 MG PO TBEC
40.0000 mg | DELAYED_RELEASE_TABLET | Freq: Two times a day (BID) | ORAL | Status: DC
  Administered 2021-08-22 – 2021-08-23 (×2): 40 mg via ORAL
  Filled 2021-08-22 (×2): qty 1

## 2021-08-22 MED ORDER — TRAVASOL 10 % IV SOLN
INTRAVENOUS | Status: DC
  Filled 2021-08-22: qty 352.8

## 2021-08-22 MED ORDER — SENNOSIDES-DOCUSATE SODIUM 8.6-50 MG PO TABS
1.0000 | ORAL_TABLET | Freq: Once | ORAL | Status: AC
Start: 2021-08-22 — End: 2021-08-22
  Administered 2021-08-22: 1 via ORAL
  Filled 2021-08-22: qty 1

## 2021-08-22 MED ORDER — SODIUM CHLORIDE 0.9 % IV SOLN
1000.0000 mL | INTRAVENOUS | Status: DC
  Administered 2021-08-22 – 2021-08-23 (×2): 1000 mL via INTRAVENOUS

## 2021-08-22 NOTE — Progress Notes (Addendum)
Initial Nutrition Assessment  DOCUMENTATION CODES:   Underweight  INTERVENTION:   -Monitor magnesium, potassium, and phosphorus BID for at least 3 days, MD to replete as needed, as pt is at risk for refeeding syndrome given h/o malnutrition, low BMI, dependence on TPN, and potentially being without TPN for several days. 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  GOAL:   Patient will meet greater than or equal to 90% of their needs  MONITOR:   Weight trends, Labs, I & O's  REASON FOR ASSESSMENT:   Consult New TPN/TNA  ASSESSMENT:   Pt with PMH significant for gastric bypass surgery in 2015, cholecystectomy, Cushing disease, osteoporosis, HTN, hypothyroidism s/p R thyroid lobectomy in 2018, bipolar 1 disorder who presents with increased abdominal pain admitted for possible choledocholithiasis. Noted pt was recently hospitalized at [facility] for 2 months 2/2 recent L hip fx; hospitalization was complicated by recurrent GI bleeds and marginal ulcer that led to reported stenosis. Pt has been on TPN since November w/ evidence of elevated hepatic enzymes. Pt currently awaiting reversal of Roux-en-Y.  Pt unavailable at time of RD visit x 3 attempts. Hx obtained from H&P and chart review. Pt reports increased abdominal and left hip pain beginning on 01/06 (when discharged from [facility]). Pt states that she was able to tolerate small amounts of broth at a time but otherwise is not able to  tolerate PO intake. Pt has been on TPN with more details listed below. States she has not had a bowel movement in >6 days. No urinary complaints. MD aware and initiated bowel regimen.   MD noted the following:  "...reviewed the discharge summary from [place], she was admitted for approximately 2 months, discharged on 1/6 to her mother's house on full TPN support.  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  She was instructed to take nothing by mouth and was receiving full nutrition support through TPN with a right arm PICC.  She has a severe protein calorie malnutrition with deconditioning from this hospitalization.  She was initially planned for subacute rehabilitation at the [facility] but there appeared to be issues with insurance authorization and instead she was discharged to her mother's house.  It sounds like she did not do very well therefore 1 or 2 days and then came to our hospital."    MD noted the following plan: "continue to maximize medical therapy with mostly her home medications and restart Carafate which she struggled to access.  We will continue TPN and strict n.p.o. except a few sips for comfort given her intestinal stricture.  We will get PT and OT consultations and figure out best disposition plan.  Hopefully we can be help her continue to transition her care to home as she has all her follow-up planned at [facility] in the coming weeks."  Please note consult for RD was placed 08/20/21 @ 0531; however, pt was  not admitted to the floor/assigned a room until 08/21/21 @ 1838 (after RD service hours). It is for this reason that RD recommendations for TPN were not placed prior to today.   Discussed pt with Pharmacy who was unaware of pt as no consult was placed for pharmacy to manage TPN. Discussed with Resident and Attending and placed order. RD  working with Pharmacy to obtain TPN orders. Note Patient is on TPN and supplies are from [facility] [phone].   Home TPN provided by [facility]: 1600 ml over 20hr = 80 ml/hr (provides 81.6g protein, 40g SMOFlipid 20%, 265g dextrose, 1627.4 kcal/d Electrolytes/bag: Na 283.33 meq, K 50 meq, no Ca, Mg 30 meq, 2.5 mmol Phos, max Cl (330 meq) Additives: 10 ml MVI, 1 ml trace elements  Today, pt endorses continued abdominal pain, but denies nausea and expresses desire for solid food. No BM yet. Discussed pt with RN who states pt has been informed she is to have sips/chips only but is taking much larger drinks than what is considered a sip. Hopeful this will improve now that TPN is being re-initiated. Discussed starting at a low rate with pharmacist due to concerns for refeeding. Pharmacist in agreement. RD will follow-up as able to obtain diet/weight history from pt in addition to nutrition-focused physical exam as it is suspected pt meets criteria for malnutrition.   Of note, GI had been following but signed off today noting pt should continue with plans for outpt follow-up at [facility] for EGD and bariatric management for revision of roux-en-y.   No weight history available for review.   UOP: 11018m x24 hours I/O: +[id number] admit  Medications: Scheduled Meds:  Chlorhexidine Gluconate Cloth  6 each Topical Daily   divalproex  250 mg Oral BID   levothyroxine  100 mcg Oral Q0600   metoprolol tartrate  25 mg Oral BID   misoprostol  200 mcg Oral Q6H   oxyCODONE  5 mg Oral Q4H   pantoprazole  40 mg Oral BID   QUEtiapine  300 mg Oral QHS   senna  1 tablet Oral BID   sodium chloride flush  10-40 mL Intracatheter Q12H   spironolactone  25 mg Oral Daily   sucralfate  1 g Oral QID  Continuous Infusions:  sodium chloride Stopped (08/22/21 1325)   sodium chloride     TPN ADULT (ION)     Labs: Recent Labs  Lab 08/20/21 0707 08/21/21 0530  08/22/21 0817  NA 135 131* 128*  K 4.1 3.9 3.5  CL 100 101 98  CO2 28 25 22   BUN 17 16 10   CREATININE 0.51 0.42* 0.46  CALCIUM 9.5 8.5* 8.2*  GLUCOSE 119* 79 58*  Elevated LFTs   NUTRITION - FOCUSED PHYSICAL EXAM: Unable to complete at this time, will attempt at follow-up  Diet Order:   Diet Order             Diet NPO time specified Except for: Ice Chips, Sips with Meds  Diet effective now                   EDUCATION NEEDS:   No education needs have been identified at this time  Skin:  Skin Assessment: Reviewed RN Assessment  Last BM:  1/4  Height:   Ht Readings from Last 1 Encounters:  08/20/21 5' 7"  (1.702 m)    Weight:   Wt Readings from Last 10 Encounters:  08/20/21 51.3 kg  BMI:  Body mass index is 17.7 kg/m.  Estimated Nutritional Needs:   Kcal:  1800-2000  Protein:  90-100 grams  Fluid:  >1.8L     Theone Stanley., MS, RD, LDN (she/her/hers) RD pager number and weekend/on-call pager number located in [place].

## 2021-08-22 NOTE — Evaluation (Signed)
Physical Therapy Evaluation Patient Details Name: Amanda Hendricks MRN: [id number] DOB: 08-Mar-1966 Today's Date: 08/22/2021  History of Present Illness  Pt is a 56 y.o. F who presents with increased abdominal pain. History of GIB during recent prolonged hospitalization as well as history of duodenal stricturing, cholecystectomy, and Roux-en-Y procedure in 2015. MRCP significant for: Severe intra and extrahepatic biliary ductal dilatation without evidence of obstructing choledocholithiasis or mass in the head of the pancreas; pancreatic divisum anatomy; multilobar pneumonia most severe in the left lower lobe with adjacent small left parapneumonic pleural effusion; hepatosplenomegaly. Significant PMH: left THA 06/2021, gastric bypass surgery (2015), Cushing disease, osteoporosis, HTN, hypothyroidism, bipolar 1 disorder.  Clinical Impression  Prior to admission, pt lives with her mother and son, uses a walker for mobility and is independent with ADL's. Pt presents with debility/deconditioning in setting of recent prolonged hospitalization, left hip pain and weakness in setting of THA (06/2021), gait abnormalities, decreased activity tolerance and impaired balance. Pt noted to be hypoxic on RA (86%); placed on 2L O2 where she rebounded to 93% and RN notified. Pt able to participate in bed level exercises for strengthening and ambulating room distances with a walker and supervision. Provided referral information to [facility] as pt is uninsured. Will continue to follow acutely.      Recommendations for follow up therapy are one component of a multi-disciplinary discharge planning process, led by the attending physician.  Recommendations may be updated based on patient status, additional functional criteria and insurance authorization.  Follow Up Recommendations Other (comment) ([facility])    Assistance Recommended at Discharge PRN  Patient can return home with the following   Help with stairs or ramp for entrance;Assist for transportation    Equipment Recommendations None recommended by PT  Recommendations for Other Services       Functional Status Assessment Patient has had a recent decline in their functional status and demonstrates the ability to make significant improvements in function in a reasonable and predictable amount of time.     Precautions / Restrictions Precautions Precautions: Fall Restrictions Weight Bearing Restrictions: No      Mobility  Bed Mobility Overal bed mobility: Modified Independent                  Transfers Overall transfer level: Modified independent Equipment used: Rolling walker (2 wheels)                    Ambulation/Gait Ambulation/Gait assistance: Supervision Gait Distance (Feet): 30 Feet Assistive device: Rolling walker (2 wheels) Gait Pattern/deviations: Step-to pattern;Step-through pattern;Decreased stance time - left;Decreased weight shift to left;Trunk flexed Gait velocity: decreased Gait velocity interpretation: <1.8 ft/sec, indicate of risk for recurrent falls   General Gait Details: slow and steady pace, cues for upright posture and line management  Stairs            Wheelchair Mobility    Modified Rankin (Stroke Patients Only)       Balance Overall balance assessment: Needs assistance Sitting-balance support: Feet supported Sitting balance-Leahy Scale: Normal     Standing balance support: No upper extremity supported;During functional activity Standing balance-Leahy Scale: Fair Standing balance comment: washing hands at sink with supervision                             Pertinent Vitals/Pain Pain Assessment: Faces Faces Pain Scale: Hurts even more Pain Location: L hip Pain Descriptors /  Indicators: Discomfort;Grimacing;Guarding Pain Intervention(s): Limited activity within patient's tolerance;Monitored during session;Premedicated before session     Home Living Family/patient expects to be discharged to:: Private residence Living Arrangements: Parent;Children (mother, son) Available Help at Discharge: Family Type of Home: House Home Access: Ramped entrance       Home Layout: One level Home Equipment: Conservation officer, nature (2 wheels) Additional Comments: Pt son recently moved in to help out. Pt home for short period following long hospitalization at [facility]. L THA 06/15/2021    Prior Function Prior Level of Function : Independent/Modified Independent             Mobility Comments: using RW ADLs Comments: reports independent     Hand Dominance        Extremity/Trunk Assessment   Upper Extremity Assessment Upper Extremity Assessment: Defer to OT evaluation    Lower Extremity Assessment Lower Extremity Assessment: LLE deficits/detail LLE Deficits / Details: s/p THA 06/2021. Grossly 2-3/5    Cervical / Trunk Assessment Cervical / Trunk Assessment: Kyphotic  Communication   Communication: No difficulties  Cognition Arousal/Alertness: Awake/alert Behavior During Therapy: WFL for tasks assessed/performed Overall Cognitive Status: Within Functional Limits for tasks assessed                                          General Comments      Exercises General Exercises - Lower Extremity Quad Sets: Left;10 reps;Supine Heel Slides: Left;10 reps;Supine Hip ABduction/ADduction: Left;10 reps;Supine   Assessment/Plan    PT Assessment Patient needs continued PT services  PT Problem List Decreased strength;Decreased activity tolerance;Decreased balance;Decreased mobility;Pain       PT Treatment Interventions DME instruction;Gait training;Functional mobility training;Therapeutic activities;Therapeutic exercise;Balance training;Patient/family education    PT Goals (Current goals can be found in the Care Plan section)  Acute Rehab PT Goals Patient Stated Goal: get stronger PT Goal Formulation: With  patient Time For Goal Achievement: 09/05/21 Potential to Achieve Goals: Good    Frequency Min 3X/week     Co-evaluation               AM-PAC PT "6 Clicks" Mobility  Outcome Measure Help needed turning from your back to your side while in a flat bed without using bedrails?: None Help needed moving from lying on your back to sitting on the side of a flat bed without using bedrails?: None Help needed moving to and from a bed to a chair (including a wheelchair)?: A Little Help needed standing up from a chair using your arms (e.g., wheelchair or bedside chair)?: None Help needed to walk in hospital room?: A Little Help needed climbing 3-5 steps with a railing? : A Little 6 Click Score: 21    End of Session   Activity Tolerance: Patient tolerated treatment well Patient left: in chair;with call bell/phone within reach Nurse Communication: Mobility status PT Visit Diagnosis: Unsteadiness on feet (R26.81);Other abnormalities of gait and mobility (R26.89);Muscle weakness (generalized) (M62.81);Difficulty in walking, not elsewhere classified (R26.2);Pain Pain - Right/Left: Left Pain - part of body: Hip    Time: 5400-8676 PT Time Calculation (min) (ACUTE ONLY): 40 min   Charges:   PT Evaluation $PT Eval Moderate Complexity: 1 Mod PT Treatments $Therapeutic Activity: 23-37 mins        Wyona Almas, PT, DPT Acute Rehabilitation Services Pager [phone] Office [phone]   Deno Etienne 08/22/2021, 9:24 AM

## 2021-08-22 NOTE — Progress Notes (Addendum)
HD#2 SUBJECTIVE:  Patient Summary: Amanda Hendricks. [place] is a 56 y.o. F with past medical history of gastric bypass surgery in 2015, cholecystectomy, Cushing disease, osteoporosis, hypertension, hypothyroidism s/p R thyroid lobectomy in 2018, bipolar 1 disorder who presents with increased abdominal pain admitted for possible choledocholithiasis.  Overnight Events: None  Interim History: Patient evaluated bedside where she was sitting up in the bedside recliner completing a word search.  She has some left hip pain that is continued but otherwise has no complaints.  OBJECTIVE:  Vital Signs: Vitals:   08/21/21 1845 08/21/21 2053 08/22/21 0520 08/22/21 0918  BP: 127/85 (!) 143/86 118/66 118/68  Pulse: 94 87 87 (!) 101  Resp: 18 18 17 20   Temp: 99.7 F (37.6 C) 99.2 F (37.3 C) 98.1 F (36.7 C) 99.6 F (37.6 C)  TempSrc: Oral Oral Oral Oral  SpO2: 91% 94% 91% 95%  Weight:      Height:       Supplemental O2: Nasal Cannula SpO2: 95 %  Filed Weights   08/20/21 0510  Weight: 51.3 kg     Intake/Output Summary (Last 24 hours) at 08/22/2021 1348 Last data filed at 08/22/2021 1141 Gross per 24 hour  Intake 3118.85 ml  Output 1100 ml  Net 2018.85 ml   Net IO Since Admission: 4,018.85 mL [08/22/21 1348]  Physical Exam: Constitutional: Patient is sitting up in bedside recliner participating in word search.  No acute distress noted. Cardio: Regular rate and rhythm.  No murmurs, rubs, gallops. Pulm: Clear to auscultation bilaterally.  Normal work of breathing on room air. Abdomen: Soft, nontender, nondistended. MSK: Negative for extremity edema. Skin: Skin is warm and dry. Neuro: Alert and oriented x3.  No focal deficit noted. Psych: Normal mood and affect.  Patient Lines/Drains/Airways Status     Active Line/Drains/Airways     Name Placement date Placement time Site Days   Peripheral IV 08/20/21 22 G Left;Posterior Hand 08/20/21  7253  Hand  2   PICC Double Lumen (Ped) Right  Cephalic --  --  -- --             ASSESSMENT/PLAN:  Assessment: Principal Problem:   Abdominal pain Active Problems:   Elevated liver enzymes   History of Roux-en-Y gastric bypass   Chronic bronchitis with emphysema (HCC)   Cushing's syndrome (HCC)   Moderate protein-calorie malnutrition ([place])   Plan: #Abdominal pain #S/p Roux-en-Y, 2015 MRCP 1/09 showed severe intra and extrahepatic biliary ductal dilatation without evidence of obstructing choledocholithiasis or mass in the head of the pancreas; pancreatic divisum anatomy; multilobar pneumonia most severe in the left lower lobe with adjacent small left parapneumonic pleural effusion; hepatosplenomegaly.  Hepatic function labs continue to show resolution towards normal.  Patient does not endorse abdominal pain at this time. -GI following, appreciate their assistance -Recommendation for patient to undergo already scheduled EGD with [facility].             -Continue Protonix 40 mg p.o. twice daily, misoprostol 200 mcg every 6 hours, Carafate 1 g 3 times daily with meals and at bedtime -Zofran 4 mg every 6 hours as needed nausea -Scheduled senna twice daily for constipation present on admission -TPN and TPN pharmacy consult placed -Will check for vitamin/mineral deficiencies as well given surgical history   #Left hip pain Patient endorses chronic pain management by [facility] in [place] with oxycodone 15 mg 6 times daily.  Review of PDMP reveals oxycodone 15 mg, 180 tabs, was last filled on 08/11/2021.  Further investigation of discharge recommendations from recent prolonged stay at [facility] reveals pain management recommendation of oxycodone 5 mg every 4 hours as needed for severe pain with an additional 5 mg permissible for breakthrough pain.  At this time she has oxycodone 5 mg every 4 hours as needed pain as well as Dilaudid 1 mg IV as needed breakthrough pain.  On physical exam she does not appear to be in distress secondary to pain.   She does ambulate with a walker.  Physical therapy has evaluated the patient and has provided information for [facility] as patient is uninsured and they do not does not recommend any equipment. -We will adjust pain management regimen to oxycodone 5 mg every 4 hours scheduled rather than as needed. -Dilaudid 1 mg IV every 4 hours as needed severe pain -PT/OT    #Hypertension Blood pressure measurements have been largely normotensive to this point.  She is managed with spironolactone and metoprolol at home. -Continue home spironolactone 25 mg daily, metoprolol 25 mg twice daily   #Hypothyroidism #History of papillary microcarcinoma of the thyroid, 2018 #S/p right thyroid lobectomy, 2018 TSH on admission of 2.43.  Patient is managed with levothyroxine 100 mcg daily. -Continue levothyroxine 100 mcg daily   #Bipolar 1 disorder Managed with valproic acid 250 mg twice daily, Seroquel 300 mg daily. -Continue valproic acid 250 mg twice daily, Seroquel 300 mg daily  Best Practice: Diet: N.p.o. IVF: Fluids: 0.9NS, Rate:  125 cc/hour VTE: Place and maintain sequential compression device Start: 08/21/21 0757 Code: Full AB: None DISPO: Anticipated discharge  1-3   pending Medical stability.  Signature: Farrel Gordon, D.O.  Internal Medicine Resident, PGY-1 [facility]  Pager: [phone] 1:48 PM, 08/22/2021   Please contact the on call pager after 5 pm and on weekends at [phone].

## 2021-08-22 NOTE — Progress Notes (Signed)
PHARMACY - TOTAL PARENTERAL NUTRITION CONSULT NOTE   Indication:  nutritional optimization and severe protein calorie malnutrition  Patient Measurements: Height: 5' 7"  (170.2 cm) Weight: 51.3 kg (113 lb) IBW/kg (Calculated) : 61.6 TPN AdjBW (KG): 51.3 Body mass index is 17.7 kg/m. Usual Weight:   Assessment: 57 yof with hx Roux-en-Y gastric bypass in 2015, cholecystectomy, Cushing disease, hypothyroidism s/p R thyroid lobectomy in 2018 admitted with increased abdominal pain. She was discharged from [facility] on 1/5 after a 2 month admit s/p L hip repair. Her hospitalization was complicated by a severe intestinal stenosis at the [place] junction along with multiple severe marginal ulcerations that caused recurring GI bleeding. She was instructed to take nothing by mouth and was receiving full nutrition support through TPN upon discharge. With improved nutrition, plan is for surgical intervention to address the segment of small bowel and the stenosis and possible reversal of Roux-en-Y. Pharmacy consulted to manage TPN.  Glucose / Insulin: no hx DM, BG 58 Electrolytes: Na 128 (down), K 3.5, CoCa 9.6; Mg 2, Phos 3.6 on 1/5 Renal: SCr 0.46, BUN 10 Hepatic: AST 76 / ALT 98 (down, thought d/t TPN per GI), Alk phos elevated 519, TG 118 on 1/1, prealbumin 22.7 on 1/1, albumin 2.2 Intake / Output; MIVF: UOP 0.9 ml/kg/hr + 1 occurrence, NS at 125 ml/hr, LBM 1/4 GI Imaging: 1/8 RUQ US/CT: scan shows some biliary ductal dilation post cholecystectomy  1/8 MRI/MRCP shows significant intra and extrahepatic biliary ductal dilatation without evidence of choledocholithiasis or pancreatic mass GI Surgeries / Procedures:   Central access: double lumen PICC 06/30/21 TPN start date: 1/10 however on PTA  Nutritional Goals: Goal TPN rate is 80 mL/hr (provides 94 g of protein and 1890 kcals per day)  Home TPN provided by [facility]: 1600 ml over 20hr = 80 ml/hr (provides 81.6g protein, 40g SMOFlipid 20%,  265g dextrose, 1627.4 kcal/d Electrolytes/bag: Na 283.33 meq, K 50 meq, no Ca, Mg 30 meq, 2.5 mmol Phos, max Cl (330 meq) Additives: 10 ml MVI, 1 ml trace elements  RD Assessment: Estimated Needs Total Energy Estimated Needs: 1800-2000 Total Protein Estimated Needs: 90-100 grams Total Fluid Estimated Needs: >1.8L  Current Nutrition:  NPO and TPN  Plan:  Start TPN at 32m/hr at 1800 Electrolytes in TPN: Na 565m/L, K 5084mL, Ca 5mE28m, Mg 5mEq59m and Phos 15mmo30m Cl:Ac 1:1 Add standard MVI and trace elements to TPN Initiate Sensitive q6h SSI and adjust as needed  Reduce MIVF to 95 mL/hr at 1800 Monitor TPN labs on Mon/Thurs, daily until lytes stable  Thank you for involving pharmacy in this patient's care.  JennifRenold GentamD, BCPS Clinical Pharmacist Clinical phone for 08/22/2021 until 3p is [phone] 12:30 PM  **Pharmacist phone directory can be found on [email or web address] listed under [facility]

## 2021-08-22 NOTE — Progress Notes (Signed)
SATURATION QUALIFICATIONS: (This note is used to comply with regulatory documentation for home oxygen)  Patient Saturations on Room Air at Rest = 86%  Patient Saturations on Room Air while Ambulating = 86%  Patient Saturations on 2 Liters of oxygen while Ambulating = 93%  Please briefly explain why patient needs home oxygen:

## 2021-08-22 NOTE — Progress Notes (Signed)
Daily Rounding Note  08/22/2021, 9:42 AM  LOS: 2 days   SUBJECTIVE:   Chief complaint:     Elevated LFTs. Abdominal pain resolved.  Hip pain persists.  No BM yet.   No nausea.  Hungry and would like solid food.    OBJECTIVE:         Vital signs in last 24 hours:    Temp:  [98.1 F (36.7 C)-99.7 F (37.6 C)] 99.6 F (37.6 C) (01/10 0918) Pulse Rate:  [84-101] 101 (01/10 0918) Resp:  [17-20] 20 (01/10 0918) BP: (118-143)/(66-86) 118/68 (01/10 0918) SpO2:  [91 %-96 %] 95 % (01/10 0918) Last BM Date: 08/16/21 Filed Weights   08/20/21 0510  Weight: 51.3 kg   General: Alert.  Comfortable.  Does not look acutely ill. Heart: RRR. Chest: Clear bilaterally Abdomen: Soft, nontender, nondistended.  Bowel sounds active. Extremities: No CCE. Neuro/Psych: Oriented x3.  Alert.  Calm, cooperative, pleasant affect.  Intake/Output from previous day: 01/09 0701 - 01/10 0700 In: 2380.7 [P.O.:20; I.V.:2360.7] Out: 1100 [Urine:1100]  Intake/Output this shift: Total I/O In: 277.1 [I.V.:277.1] Out: -   Lab Results: Recent Labs    08/20/21 0707 08/21/21 0701 08/22/21 0817  WBC 3.3* 4.6 3.9*  HGB 12.3 8.6* 9.3*  HCT 40.8 27.9* 29.7*  PLT 205 171 211   BMET Recent Labs    08/20/21 0707 08/21/21 0530 08/22/21 0817  NA 135 131* 128*  K 4.1 3.9 3.5  CL 100 101 98  CO2 28 25 22   GLUCOSE 119* 79 58*  BUN 17 16 10   CREATININE 0.51 0.42* 0.46  CALCIUM 9.5 8.5* 8.2*   LFT Recent Labs    08/20/21 0707 08/21/21 0530 08/22/21 0817  PROT 7.3 5.1* 5.4*  ALBUMIN 3.1* 2.0* 2.2*  AST 111* 105* 76*  ALT 110* 112* 98*  ALKPHOS 733* 405* 519*  BILITOT 0.3 0.5 0.6   PT/INR No results for input(s): LABPROT, INR in the last 72 hours. Hepatitis Panel No results for input(s): HEPBSAG, HCVAB, HEPAIGM, HEPBIGM in the last 72 hours.  Studies/Results: DG Chest 1 View  Result Date: 08/21/2021 CLINICAL DATA:  Chest pain and  tightness. EXAM: CHEST  1 VIEW COMPARISON:  CT abdomen and pelvis 08/20/2021. chest x-ray 08/20/2000. FINDINGS: Left lower lobe airspace consolidation has increased. Minimal patchy airspace disease in the right lung base is unchanged. There is also patchy airspace disease in the left upper lobe. There may be a small left pleural effusion. There is no evidence for pneumothorax. The cardiomediastinal silhouette is within normal limits. Right upper extremity PICC terminates in the distal SVC. Cardiac pacer device overlies the left lower chest. No acute fractures are seen. IMPRESSION: 1. Right upper extremity PICC terminates over the distal SVC. 2. Bilateral multifocal airspace disease, left greater than right, worrisome for multifocal pneumonia. Left basilar consolidation has increased. 3. Questionable small left pleural effusion. Electronically Signed   By: Ronney Asters M.D.   On: 08/21/2021 17:32   MR ABDOMEN MRCP W WO CONTAST  Result Date: 08/21/2021 CLINICAL DATA:  56 year old female with history of right upper quadrant abdominal pain. Biliary tract dilatation noted on ultrasound examination. Evaluate for choledocholithiasis. EXAM: MRI ABDOMEN WITHOUT AND WITH CONTRAST (INCLUDING MRCP) TECHNIQUE: Multiplanar multisequence MR imaging of the abdomen was performed both before and after the administration of intravenous contrast. Heavily T2-weighted images of the biliary and pancreatic ducts were obtained, and three-dimensional MRCP images were rendered by post processing. CONTRAST:  10m  GADAVIST GADOBUTROL 1 MMOL/ML IV SOLN COMPARISON:  No prior abdominal MRI. CT the abdomen and pelvis 08/20/2021. Abdominal ultrasound 08/20/2021. FINDINGS: Comment: Today's study is severely limited for detection and characterization of visceral and/or vascular lesions by extensive patient respiratory motion. Lower chest: Patchy areas of increased signal intensity throughout the visualize lung bases corresponding to areas of  airspace consolidation on recent CT examination, most severe in the left lower lobe. Small left-sided pleural effusion. Hepatobiliary: Liver is enlarged measuring up to 20.9 cm in craniocaudal span. No discrete cystic or solid hepatic lesions are confidently identified on today's motion limited examination. Status post cholecystectomy. MRCP images demonstrate severe intra and extrahepatic biliary ductal dilatation. There is no filling defect within the common bile duct to suggest the presence of choledocholithiasis. Pancreas: No pancreatic mass confidently identified on today's motion limited examination. The main pancreatic duct is diffusely ectatic (3 mm) and empties via the minor papilla. The ventral pancreatic duct is normal in caliber, and enters at the major papilla in conjunction with the common bile duct (i.e., there is pancreatic divisum anatomy). No peripancreatic fluid collections or inflammatory changes. Spleen: Spleen is enlarged measuring 11.8 x 9.2 x 11.2 cm (estimated splenic volume of 608 mL) . Adrenals/Urinary Tract: Bilateral kidneys and adrenal glands are normal in appearance. No hydroureteronephrosis in the visualized portions of the abdomen. Stomach/Bowel: Signal void in the region of the stomach and bowel related to prior Roux-en-Y gastric bypass. Vascular/Lymphatic: No aneurysm identified in the visualized abdominal vasculature. No lymphadenopathy noted in the abdomen. Other: No significant volume of ascites noted in the visualized portions of the peritoneal cavity. Musculoskeletal: No aggressive appearing osseous lesions are noted in the visualized portions of the skeleton. IMPRESSION: 1. Severe intra and extrahepatic biliary ductal dilatation without evidence of obstructing choledocholithiasis or obstructing mass in the head of the pancreas. This may simply reflect very profound benign post cholecystectomy physiologic ductal dilatation. However, if there is biochemical evidence of biliary  tract obstruction, further evaluation with endoscopic ultrasound should be considered to exclude the possibility of a very small mass at the level of the ampulla not visualized on today's limited MRI/MRCP examination. 2. Pancreatic divisum anatomy is noted. 3. Multilobar pneumonia noted in the visualize lung bases, most severe in the left lower lobe where there is a adjacent small left parapneumonic pleural effusion. 4. Hepatosplenomegaly. Electronically Signed   By: Vinnie Langton M.D.   On: 08/21/2021 05:20   US Abdomen Limited RUQ (LIVER/GB)  Result Date: 08/20/2021 CLINICAL DATA:  56 year old female with epigastric pain. Prior cholecystectomy. EXAM: ULTRASOUND ABDOMEN LIMITED RIGHT UPPER QUADRANT COMPARISON:  CT Abdomen and Pelvis 0913 hours today. FINDINGS: Gallbladder: Surgically absent. Common bile duct: Diameter: 11.3 cm, enlarged (up to 15 mm by CT this morning). No intraductal filling defect identified. Liver: Generalized intrahepatic biliary ductal dilatation as seen by CT. Portal vein is patent on color Doppler imaging with normal direction of blood flow towards the liver. Background liver parenchyma appears echogenic (images 20 and 34). No discrete liver lesion. But there is hepatomegaly as demonstrated by CT and on some images the liver surface appears mildly nodular (image 14). Other: Negative visible right kidney. No ascites. IMPRESSION: 1. Intra- and extrahepatic biliary ductal dilatation as seen by CT this morning. No intraductal filling defect identified by ultrasound. 2. Hepatomegaly with possible cirrhosis.  No discrete liver lesion. 3. Cholecystectomy. Electronically Signed   By: Genevie Ann M.D.   On: 08/20/2021 10:11    Scheduled Meds:  Chlorhexidine Gluconate  Cloth  6 each Topical Daily   divalproex  250 mg Oral BID   levothyroxine  100 mcg Oral Q0600   metoprolol tartrate  25 mg Oral BID   misoprostol  200 mcg Oral Q6H   pantoprazole  40 mg Oral BID   QUEtiapine  300 mg Oral QHS    senna  1 tablet Oral BID   sodium chloride flush  10-40 mL Intracatheter Q12H   spironolactone  25 mg Oral Daily   sucralfate  1 g Oral QID   Continuous Infusions:  sodium chloride 125 mL/hr at 08/22/21 0753   PRN Meds:.acetaminophen **OR** acetaminophen, alum & mag hydroxide-simeth **AND** lidocaine, HYDROmorphone (DILAUDID) injection, ondansetron **OR** ondansetron (ZOFRAN) IV, oxyCODONE, sodium chloride flush   ASSESMENT:   Elevated LFTs, esp Alk Phos.  Chronic epigastric pain.  2015 RYGP.  NAFLD. 06/19/21 enteroscopy for UGI bleed: Esophagitis, marginal ulcer.  Jejunal ulcers with dusky, ischemic appearance.  Treated with Protonix bid, Carafate qid (not taking as insurance not covering but only home x 1 d before current admission), misoprostol qid.   07/06/2021 EGD.  Multiple areas of bruising were clipped. 07/20/2021 EGD with biopsies.  Unable to unearth path report.  Showed severe GJ stenosis, diseased mucosa.   Deferred future surgery (vagotomy, GJ revision, potential RYGB reversal) to optimize nutrition over at least 2 months.  TPN initiated by [facility] 9/22 07/04/21 colonoscopy, for CT suggesting colitis: unremarkable. 08/20/2020 CTAP w contrast: Surgical changes post RYGP.  S/p cholecystectomy, intra/extrahepatic biliary ductal dilatation, CBD up to 1.5 mm.  Hepatomegaly. 08/20/2020 ultrasound showing intra and extrahepatic biliary ductal dilatation.  Hepatomegaly, possible cirrhosis, no distinct liver lesion 05/20/2021 MRI/MRCP.  Severe intra/extrahepatic biliary ductal dilatation without obstructing choledocholithiasis or mass.  Changes may reflect benign but profound post cholecystectomy physiologic ductal dilatation.  Pancreatic divisum anatomy.  Hepatosplenomegaly.  Multi lobar pneumonia. Note that CT of 11/13 showed "enlarging hepatic and intrahepatic bile ducts".  [facility] performed ultrasound liver 07/09/2021 for elevated LFTs.  It revealed CBD of 1.4 cm, postcholecystectomy anatomy, mild  intrahepatic bile duct dilatation.  Small perihepatic ascites.    Multiple recent hip dislocations, surgeries.  04/09/21 THA for fx.  Recurrent greater trochanteric fx 9/2: non-op mgt.  Dislocation hip 9/25, underwent operative reduction.  Recurrent dislocaion 9/30.  06/15/21 Head and liner exchange and abductor repair.  This may be driving alk phos elevation.       Chronic pain.  Followed by [facility] in [facility] for pain mgt.  Currently not on her home doses of scheduled pain meds.      Hyponatremia.       [place] anemia.  Hgb in historic range.  Currently is 9.3.     PLAN     Switch to po bid PPI. continue senekot bid but additional 1 x dose now.  Ordered reg diet.      Waiting on IV team to draw AM labs from existing PICC.  Advise that pt get scheduled pain meds as per home routine.  She can always decline taking narcotics if she wants but w scheduled dosing will have better pain mgt.        Keep scheduled EGD at [facility] later this month w planning for future surgery/vagotomy/reversal RYGB per surgeons there.      Maintain TPN.   Ordered reg diet.      Azucena Freed  08/22/2021, 9:42 AM Phone [phone]

## 2021-08-22 NOTE — Plan of Care (Signed)
°  Problem: Clinical Measurements: Goal: Diagnostic test results will improve Outcome: Progressing   Problem: Activity: Goal: Risk for activity intolerance will decrease Outcome: Progressing   Problem: Coping: Goal: Level of anxiety will decrease Outcome: Progressing   Problem: Pain Managment: Goal: General experience of comfort will improve Outcome: Progressing

## 2021-08-23 LAB — CBC WITH DIFFERENTIAL/PLATELET
Abs Immature Granulocytes: 0.1 10*3/uL — ABNORMAL HIGH (ref 0.00–0.07)
Basophils Absolute: 0 10*3/uL (ref 0.0–0.1)
Basophils Relative: 0 %
Eosinophils Absolute: 0.1 10*3/uL (ref 0.0–0.5)
Eosinophils Relative: 2 %
HCT: 25.2 % — ABNORMAL LOW (ref 36.0–46.0)
Hemoglobin: 7.9 g/dL — ABNORMAL LOW (ref 12.0–15.0)
Lymphocytes Relative: 34 %
Lymphs Abs: 1.2 10*3/uL (ref 0.7–4.0)
MCH: 28.4 pg (ref 26.0–34.0)
MCHC: 31.3 g/dL (ref 30.0–36.0)
MCV: 90.6 fL (ref 80.0–100.0)
Monocytes Absolute: 0.1 10*3/uL (ref 0.1–1.0)
Monocytes Relative: 3 %
Myelocytes: 1 %
Neutro Abs: 2.1 10*3/uL (ref 1.7–7.7)
Neutrophils Relative %: 58 %
Platelets: 201 10*3/uL (ref 150–400)
Promyelocytes Relative: 2 %
RBC: 2.78 MIL/uL — ABNORMAL LOW (ref 3.87–5.11)
RDW: 13.8 % (ref 11.5–15.5)
WBC: 3.6 10*3/uL — ABNORMAL LOW (ref 4.0–10.5)
nRBC: 0 % (ref 0.0–0.2)
nRBC: 0 /100 WBC

## 2021-08-23 LAB — MISC LABCORP TEST (SEND OUT): Labcorp test code: 716910

## 2021-08-23 LAB — COMPREHENSIVE METABOLIC PANEL
ALT: 81 U/L — ABNORMAL HIGH (ref 0–44)
AST: 59 U/L — ABNORMAL HIGH (ref 15–41)
Albumin: 1.8 g/dL — ABNORMAL LOW (ref 3.5–5.0)
Alkaline Phosphatase: 473 U/L — ABNORMAL HIGH (ref 38–126)
Anion gap: 6 (ref 5–15)
BUN: 11 mg/dL (ref 6–20)
CO2: 25 mmol/L (ref 22–32)
Calcium: 8.1 mg/dL — ABNORMAL LOW (ref 8.9–10.3)
Chloride: 100 mmol/L (ref 98–111)
Creatinine, Ser: 0.47 mg/dL (ref 0.44–1.00)
GFR, Estimated: >60 mL/min (ref >60)
Glucose, Bld: 92 mg/dL (ref 70–99)
Potassium: 3.3 mmol/L — ABNORMAL LOW (ref 3.5–5.1)
Sodium: 131 mmol/L — ABNORMAL LOW (ref 135–145)
Total Bilirubin: 0.5 mg/dL (ref 0.3–1.2)
Total Protein: 4.8 g/dL — ABNORMAL LOW (ref 6.5–8.1)

## 2021-08-23 LAB — PHOSPHORUS: Phosphorus: 3.9 mg/dL (ref 2.5–4.6)

## 2021-08-23 LAB — MAGNESIUM: Magnesium: 1.3 mg/dL — ABNORMAL LOW (ref 1.7–2.4)

## 2021-08-23 LAB — HEMOGLOBIN A1C
Hgb A1c MFr Bld: 4.5 % — ABNORMAL LOW (ref 4.8–5.6)
Mean Plasma Glucose: 82.45 mg/dL

## 2021-08-23 LAB — GLUCOSE, CAPILLARY: Glucose-Capillary: 92 mg/dL (ref 70–99)

## 2021-08-23 LAB — TRIGLYCERIDES: Triglycerides: 109 mg/dL (ref <150)

## 2021-08-23 MED ORDER — INSULIN ASPART 100 UNIT/ML IJ SOLN: 0.0000 [IU] | Freq: Four times a day (QID) | INTRAMUSCULAR | Status: DC

## 2021-08-23 MED ORDER — POTASSIUM CHLORIDE 10 MEQ/50ML IV SOLN
10.0000 meq | INTRAVENOUS | Status: AC
  Administered 2021-08-23 (×4): 10 meq via INTRAVENOUS
  Filled 2021-08-23 (×4): qty 50

## 2021-08-23 MED ORDER — DOXYCYCLINE HYCLATE 100 MG PO TABS
100.0000 mg | ORAL_TABLET | Freq: Two times a day (BID) | ORAL | Status: DC
  Administered 2021-08-23: 100 mg via ORAL
  Filled 2021-08-23: qty 1

## 2021-08-23 MED ORDER — HEPARIN SOD (PORK) LOCK FLUSH 100 UNIT/ML IV SOLN
250.0000 [IU] | INTRAVENOUS | Status: AC | PRN
  Administered 2021-08-23: 250 [IU]
  Filled 2021-08-23: qty 2.5

## 2021-08-23 MED ORDER — PROMETHAZINE HCL 25 MG PO TABS
12.5000 mg | ORAL_TABLET | Freq: Once | ORAL | Status: AC
  Administered 2021-08-23: 12.5 mg via ORAL
  Filled 2021-08-23: qty 1

## 2021-08-23 MED ORDER — DOXYCYCLINE HYCLATE 100 MG PO TABS: 100.0000 mg | ORAL_TABLET | Freq: Two times a day (BID) | ORAL | 0 refills | Status: AC

## 2021-08-23 MED ORDER — MAGNESIUM SULFATE 2 GM/50ML IV SOLN
2.0000 g | Freq: Once | INTRAVENOUS | Status: AC
  Administered 2021-08-23: 2 g via INTRAVENOUS
  Filled 2021-08-23: qty 50

## 2021-08-23 NOTE — TOC Transition Note (Addendum)
Transition of Care [facility]) - CM/SW Discharge Note   Patient Details  Name: Amanda Hendricks MRN: [id number] Date of Birth: 1965-12-10  Transition of Care [facility]) CM/SW Contact:  Tom-Johnson, Renea Ee, RN Phone Number: 08/23/2021, 2:55 PM   Clinical Narrative:     Patient is scheduled for discharge home today. PT recommended outpatient PT. Patient states she prefers to go to [facility] in [place]. CM faxed order to [facility] with successful notice received. Patient is active with [facility] for RN services. CM called and spoke with Wilhemena Durie [phone]) and notified of patient's discharge home today. Patient is also active with [facility]. CM called and notified Claiborne Billings [phone]) of patient's discharge. Claiborne Billings states a delivery will be done before Friday as patient only have three infusion bags left. Discharge summary and order to resume TPN faxed to [facility] with successful notice received. Patient states she is active with [facility] for Oxygen supplies. CM called [phone]) but unsuccessful to speak with someone. Patient states she has enough oxygen supplies at home and will call them when she is running out. Mother and son to transport at discharge. No further TOC needs noted.   Final next level of care: [place] Barriers to Discharge: Barriers Resolved   Patient Goals and CMS Choice Patient states their goals for this hospitalization and ongoing recovery are:: To return home CMS Medicare.gov Compare Post Acute Care list provided to:: Patient Choice offered to / list presented to : Patient  Discharge Placement                       Discharge Plan and Services                DME Arranged: 3-N-1, Oxygen DME Agency: [facility] Date DME Agency Contacted: 08/23/21 Time DME Agency Contacted: 2800   [place] Arranged: RN, TPN HH Agency: Other - See comment Personnel officer Homecare) Date [place]: 08/23/21 Time [place]:  1455    Social Determinants of Health (SDOH) Interventions     Readmission Risk Interventions No flowsheet data found.

## 2021-08-23 NOTE — Progress Notes (Signed)
Physical Therapy Treatment Patient Details Name: Amanda Hendricks MRN: [id number] DOB: Apr 09, 1966 Today's Date: 08/23/2021   History of Present Illness Pt is a 56 y.o. F who presents with increased abdominal pain. History of GIB during recent prolonged hospitalization as well as history of duodenal stricturing, cholecystectomy, and Roux-en-Y procedure in 2015. MRCP significant for: Severe intra and extrahepatic biliary ductal dilatation without evidence of obstructing choledocholithiasis or mass in the head of the pancreas; pancreatic divisum anatomy; multilobar pneumonia most severe in the left lower lobe with adjacent small left parapneumonic pleural effusion; hepatosplenomegaly. Significant PMH: left THA 06/2021, gastric bypass surgery (2015), Cushing disease, osteoporosis, HTN, hypothyroidism, bipolar 1 disorder.    PT Comments    Pt progressing steadily towards her physical therapy goals. Session focused on therapeutic exercises for LLE strengthening and gait training. Pt ambulating 100 feet with a walker at a supervision level. Continues with left hip and back pain, LLE weakness and gait abnormalities. Would benefit from follow up OPPT to address as pt reports she never had physical therapy post op due to prolonged hospitalization.     Recommendations for follow up therapy are one component of a multi-disciplinary discharge planning process, led by the attending physician.  Recommendations may be updated based on patient status, additional functional criteria and insurance authorization.  Follow Up Recommendations  Outpatient PT     Assistance Recommended at Discharge PRN  Patient can return home with the following Help with stairs or ramp for entrance;Assist for transportation   Equipment Recommendations  None recommended by PT    Recommendations for Other Services       Precautions / Restrictions Precautions Precautions: Fall Restrictions Weight Bearing Restrictions: No      Mobility  Bed Mobility Overal bed mobility: Modified Independent                  Transfers Overall transfer level: Modified independent Equipment used: Rolling walker (2 wheels)                    Ambulation/Gait Ambulation/Gait assistance: Supervision Gait Distance (Feet): 100 Feet Assistive device: Rolling walker (2 wheels) Gait Pattern/deviations: Step-through pattern;Decreased stance time - left;Decreased weight shift to left;Trunk flexed Gait velocity: decreased     General Gait Details: slow and steady pace, cues for upright posture and glute activation   Stairs             Wheelchair Mobility    Modified Rankin (Stroke Patients Only)       Balance Overall balance assessment: Needs assistance Sitting-balance support: Feet supported Sitting balance-Leahy Scale: Normal     Standing balance support: No upper extremity supported;During functional activity Standing balance-Leahy Scale: Fair                              Cognition Arousal/Alertness: Awake/alert Behavior During Therapy: WFL for tasks assessed/performed Overall Cognitive Status: Within Functional Limits for tasks assessed                                          Exercises General Exercises - Lower Extremity Quad Sets: Left;10 reps;Supine Heel Slides: Left;10 reps;Supine Hip ABduction/ADduction: Left;10 reps;Supine Straight Leg Raises: AAROM;Left;10 reps;Supine    General Comments        Pertinent Vitals/Pain Pain Assessment: Faces Faces Pain Scale: Hurts even more Pain Location:  L hip Pain Descriptors / Indicators: Discomfort;Grimacing;Guarding;Aching Pain Intervention(s): Limited activity within patient's tolerance;Monitored during session;Premedicated before session    Home Living                          Prior Function            PT Goals (current goals can now be found in the care plan section) Acute Rehab PT  Goals Patient Stated Goal: get stronger PT Goal Formulation: With patient Time For Goal Achievement: 09/05/21 Potential to Achieve Goals: Good Progress towards PT goals: Progressing toward goals    Frequency    Min 3X/week      PT Plan Current plan remains appropriate    Co-evaluation              AM-PAC PT "6 Clicks" Mobility   Outcome Measure  Help needed turning from your back to your side while in a flat bed without using bedrails?: None Help needed moving from lying on your back to sitting on the side of a flat bed without using bedrails?: None Help needed moving to and from a bed to a chair (including a wheelchair)?: A Little Help needed standing up from a chair using your arms (e.g., wheelchair or bedside chair)?: None Help needed to walk in hospital room?: A Little Help needed climbing 3-5 steps with a railing? : A Little 6 Click Score: 21    End of Session   Activity Tolerance: Patient tolerated treatment well Patient left: with call bell/phone within reach;in bed Nurse Communication: Mobility status PT Visit Diagnosis: Unsteadiness on feet (R26.81);Other abnormalities of gait and mobility (R26.89);Muscle weakness (generalized) (M62.81);Difficulty in walking, not elsewhere classified (R26.2);Pain Pain - Right/Left: Left Pain - part of body: Hip     Time: 1035-1101 PT Time Calculation (min) (ACUTE ONLY): 26 min  Charges:  $Gait Training: 8-22 mins $Therapeutic Exercise: 8-22 mins                     Wyona Almas, PT, DPT Acute Rehabilitation Services Pager [phone] Office [phone]    Deno Etienne 08/23/2021, 12:57 PM

## 2021-08-23 NOTE — Discharge Summary (Signed)
Name: Amanda Hendricks MRN: [id number] DOB: November 08, 1965 56 y.o. PCP: Riki Rusk, MD  Date of Admission: 08/20/2021  5:01 AM Date of Discharge: 08/23/2021 Attending Physician: Axel Filler, *  Discharge Diagnosis: 1. Abdominal pain s/p Roux-en-Y (2015) 2. Protein-calorie malnutrition 3. Left hip pain 2/2 recent hip dislocation 4. Hypertension 5. Hypokalemia 6. Hypomagnesemia  Discharge Medications: Allergies as of 08/23/2021       Reactions   Lisinopril    Other reaction(s): Cough        Medication List     TAKE these medications    acetaminophen 325 MG tablet Commonly known as: TYLENOL Take 650 mg by mouth every 6 (six) hours as needed for mild pain or headache.   calcium carbonate 1250 (500 Ca) MG chewable tablet Commonly known as: OS-CAL Chew 500 mg by mouth 3 (three) times daily as needed for heartburn.   divalproex 250 MG DR tablet Commonly known as: DEPAKOTE Take 250 mg by mouth 2 (two) times daily.   doxycycline 100 MG tablet Commonly known as: VIBRA-TABS Take 1 tablet (100 mg total) by mouth every 12 (twelve) hours for 5 days.   DULoxetine 30 MG capsule Commonly known as: CYMBALTA Take 30 mg by mouth daily.   ferrous sulfate 325 (65 FE) MG EC tablet Take 1 tablet by mouth daily with breakfast.   levothyroxine 100 MCG tablet Commonly known as: SYNTHROID Take 100 mcg by mouth daily.   Magnesium Chloride 64 MG Tbec Take 535 mg by mouth in the morning and at bedtime.   methocarbamol 500 MG tablet Commonly known as: ROBAXIN Take 1,000 mg by mouth every 8 (eight) hours as needed for muscle spasms.   metoprolol tartrate 25 MG tablet Commonly known as: LOPRESSOR Take 25 mg by mouth in the morning and at bedtime.   misoprostol 200 MCG tablet Commonly known as: CYTOTEC Take 200 mcg by mouth 4 (four) times daily.   naloxone 4 MG/0.1ML Liqd nasal spray kit Commonly known as: NARCAN Place 0.4 mg into the nose as needed (accidental  overdose).   ondansetron 4 MG disintegrating tablet Commonly known as: ZOFRAN-ODT Take 4 mg by mouth every 8 (eight) hours as needed for nausea or vomiting.   oxyCODONE 5 MG immediate release tablet Commonly known as: Oxy IR/ROXICODONE Take 5 mg by mouth every 4 (four) hours as needed for moderate pain.   pantoprazole 40 MG tablet Commonly known as: PROTONIX Take 40 mg by mouth 2 (two) times daily.   polyethylene glycol powder 17 GM/SCOOP powder Commonly known as: GLYCOLAX/MIRALAX Take 17 g by mouth daily as needed for mild constipation.   prochlorperazine 5 MG tablet Commonly known as: COMPAZINE Take 5 mg by mouth every 8 (eight) hours as needed for nausea or vomiting.   promethazine 12.5 MG tablet Commonly known as: PHENERGAN Take 12.5 mg by mouth every 8 (eight) hours as needed for nausea or vomiting.   QUEtiapine 300 MG tablet Commonly known as: SEROQUEL Take 300 mg by mouth at bedtime.   senna-docusate 8.6-50 MG tablet Commonly known as: Senokot-S Take 2 tablets by mouth 2 (two) times daily as needed for mild constipation.   spironolactone 25 MG tablet Commonly known as: ALDACTONE Take 25 mg by mouth daily.   sucralfate 1 GM/10ML suspension Commonly known as: CARAFATE Take 10 mLs by mouth 4 (four) times daily -  before meals and at bedtime.               Durable Medical Equipment  (  From admission, onward)           Start     Ordered   08/23/21 1018  For home use only DME oxygen  Once       Question Answer Comment  Length of Need 12 Months   Mode or (Route) Nasal cannula   Liters per Minute 2   Frequency Continuous (stationary and portable oxygen unit needed)   Oxygen delivery system Gas      08/23/21 1017            Disposition and follow-up:   AmandaAmanda Hendricks was discharged from [facility] in Stable condition.  At the hospital follow up visit please address:  1. Abdominal pain s/p Roux-en-Y (2015): Patient to  continue home medications and follow-up with Amanda Hendricks in the upcoming weeks.   2. Chronic left hip pain: Will discharge on home regimen. Can consider adding additional non-opioid medications to reduce opioid burden.  3. Protein-calorie malnutrition: Patient to continue TPN with strict NPO except few sips.  4.  Labs / imaging needed at time of follow-up: CBC, BMP  5.  Pending labs/ test needing follow-up: Vitamin levels: B1, B12, B9, A, D, C, copper, selenium, and zinc  Follow-up Appointments:  Follow-up [facility] Follow up.   Why: Call to make appointment to establish primary care doctor Contact information: [place] [id number] [phone]        [facility] Follow up.   Why: Call to schedule first outpatient PT appointment. Contact information: [address], [place] [address]  [phone]               Hospital Course by problem list: 1. Abdominal pain s/p Roux-en-Y (2015): Patient arrived to [facility] 1/8 afebrile and hemodynamically stable. Work-up notable for hypoalbuminemia, elevated liver enzymes, and intra and extrahepatic biliary dilation on CT abdomen/pelvis. Hendricks was consulted and IMTS was called for admission. On admission, IV pantoprazole, misoprostol, and home Carafate were initiated. MRCP was ordered by Hendricks for further evaluation. This revealed no gallstones, possibly benign post cholecystectomy ductal dilation. At this juncture, it was determined this was likely chronic in nature and less likely to be an acute issue. In addition, Amanda Hendricks recently trialed some broth prior to arrival, which led to some nausea/vomiting. Patient's lab work continued to be re-assuring and she remained hemodynamically stable throughout admission. Plan at discharge is to have Ms. Poss follow-up with Amanda Hendricks in a few weeks. Given biliary ductal dilatation without common bile duct stone,  ampullary lesion could not be excluded. Hendricks recommends EUS in the future but currently not feasible given Roux-en-Y anatomy.    2. Protein-calorie malnutrition: Patient had a prolonged hospital course at [facility] prior to arriving at [facility]. She was discharged with TPN given her intestinal stricture. She did receive TPN during her hospitalization. She is to continue this at discharge.  3. Hypertension: Blood pressure stable throughout admission. Patient to continue with her home antihypertensives.  4. Left hip pain 2/2 recent hip dislocation: Patient is chronically on significant amount of opioids for her recent hip dislocation. We continued her home regimen while inpatient. Patient to continue this at discharge. Can consider adding additional non-opioid medication to attempt to wean down opioid medication.  5. Hypomagnesemia: Patient was found to have low magnesium level. This was repleted with 2 g of magnesium sulfate.  6. Hypothyroidism: Chronic problem for  the patient. Her levothyroxine 100 mcg was continued.   7. Bipolar 1 Disorder: Chronic problem for the patient. Her home Valproic acid, and Seroquel were continued.   Discharge Subjective: Denied any acute concerns.  Discharge Exam:   BP 124/78 (BP Location: Left Arm)    Pulse 79    Temp 97.9 F (36.6 C) (Oral)    Resp 17    Ht _0  (1.702 m)    Wt 54.6 kg    SpO2 94%    BMI 18.85 kg/m  Physical Exam:  General: NAD Head: Normocephalic without scalp lesions.  Neck: Neck supple with full range of motion (ROM).   Lungs: CTAB, no wheeze, rhonchi or rales.  Cardiovascular: Normal heart sounds, no r/m/g, 2+ radial pulses  Abdomen: No TTP, normal bowel sounds MSK: No asymmetry or muscle atrophy.  Skin: warm, dry, good skin turgor Neuro: Alert and oriented. CN grossly intact Psych: Normal mood and normal affect   Pertinent Labs, Studies, and Procedures:  CBC Latest Ref Rng & Units 08/23/2021 08/22/2021 08/21/2021  WBC 4.0 - 10.5 K/uL 3.6(L)  3.9(L) 4.6  Hemoglobin 12.0 - 15.0 g/dL 7.9(L) 9.3(L) 8.6(L)  Hematocrit 36.0 - 46.0 % 25.2(L) 29.7(L) 27.9(L)  Platelets 150 - 400 K/uL 201 211 171    BMP Latest Ref Rng & Units 08/23/2021 08/22/2021 08/21/2021  Glucose 70 - 99 mg/dL 92 58(L) 79  BUN 6 - 20 mg/dL _1 Creatinine 0.44 - 1.00 mg/dL 0.47 0.46 0.42(L)  Sodium 135 - 145 mmol/L 131(L) 128(L) 131(L)  Potassium 3.5 - 5.1 mmol/L 3.3(L) 3.5 3.9  Chloride 98 - 111 mmol/L 100 98 101  CO2 22 - 32 mmol/L _2 Calcium 8.9 - 10.3 mg/dL 8.1(L) 8.2(L) 8.5(L)    A1c: 4.5 Lipase 27 Respiratory Panel: Negative for Flu and COVID 19. HIV Antibody: Negative TSH: 2.5  Discharge Instructions: Discharge Instructions     Call MD for:  difficulty breathing, headache or visual disturbances   Complete by: As directed    Call MD for:  extreme fatigue   Complete by: As directed    Call MD for:  hives   Complete by: As directed    Call MD for:  persistant dizziness or light-headedness   Complete by: As directed    Call MD for:  persistant nausea and vomiting   Complete by: As directed    Call MD for:  redness, tenderness, or signs of infection (pain, swelling, redness, odor or green/yellow discharge around incision site)   Complete by: As directed    Call MD for:  severe uncontrolled pain   Complete by: As directed    Call MD for:  temperature >100.4   Complete by: As directed    Diet - low sodium heart healthy   Complete by: As directed    Discharge instructions   Complete by: As directed    Ms. Muzio, I am glad you are feeling better and get to go home today! You were admitted for concern for gallstones. Thankfully, our tests showed no stones. You are now medically stable for discharge. Please see the following notes:  - You will continue with TPN as you were before you arrived at the hospital.  - You have a slight pneumonia with your cough. We have prescribed an antibiotic for this. Please take doxycycline 172m twice daily  for 5 days. Last day of antibiotics should be 08/27/2021.  - Please make sure to follow-up with the Hendricks doctors from  [facility] as well as your primary care physician.  It was a pleasure meeting you, Ms. Spranger. I wish you the best and hope you stay happy and healthy!  Thank you, Sanjuan Dame, MD   Increase activity slowly   Complete by: As directed        Signed: Idamae Schuller, MD 08/23/2021, 12:59 PM   Pager: [phone]

## 2021-08-23 NOTE — Progress Notes (Addendum)
PHARMACY - TOTAL PARENTERAL NUTRITION CONSULT NOTE   Indication:  nutritional optimization and severe protein calorie malnutrition  Patient Measurements: Height: 5' 7"  (170.2 cm) Weight: 54.6 kg (120 lb 5.9 oz) IBW/kg (Calculated) : 61.6 TPN AdjBW (KG): 54.6 Body mass index is 18.85 kg/m. Usual Weight:   Assessment: 89 yof with hx Roux-en-Y gastric bypass in 2015, cholecystectomy, Cushing disease, hypothyroidism s/p R thyroid lobectomy in 2018 admitted with increased abdominal pain. She was discharged from [facility] on 1/5 after a 2 month admit s/p L hip repair. Her hospitalization was complicated by a severe intestinal stenosis at the [place] junction along with multiple severe marginal ulcerations that caused recurring GI bleeding. She was instructed to take nothing by mouth and was receiving full nutrition support through TPN upon discharge. With improved nutrition, plan is for surgical intervention to address the segment of small bowel and the stenosis and possible reversal of Roux-en-Y. Pharmacy consulted to manage TPN.  Glucose / Insulin: no hx DM, BG controlled on low end (hypoglycemic episode x 1 on 1/10) Electrolytes: Na 131 (up), K 3.3 (PTA spironolactone resumed), Mg 1.3 (Mag sulfate 2g IV x 1 already given today), others WNL Renal: SCr stable WNL, BUN WNL Hepatic: LFTs elevated (thought due to TPN per GI) but now trending down - AST 59 / ALT 81, TG WNL, prealbumin 22.7 (WNL) on 1/1, albumin 1.8 Intake / Output; MIVF: UOP reported x 3 occurrences, stool x 1 occurrence; MIVF: NS at 95 ml/hr, LBM 1/4 GI Imaging: 1/8 RUQ US/CT: scan shows some biliary ductal dilation post cholecystectomy  1/8 MRI/MRCP shows significant intra and extrahepatic biliary ductal dilatation without evidence of choledocholithiasis or pancreatic mass GI Surgeries / Procedures:   Central access: double lumen PICC 06/30/21 TPN start date: 1/10 however on PTA  Nutritional Goals: Goal TPN rate is 80 mL/hr (provides 94  g of protein and 1890 kcals per day)  Home TPN provided by [facility]: 1600 ml over 20hr = 80 ml/hr (provides 81.6g protein, 40g SMOFlipid 20%, 265g dextrose, 1627.4 kcal/d Electrolytes/bag: Na 283.33 meq, K 50 meq, no Ca, Mg 30 meq, 2.5 mmol Phos, max Cl (330 meq) Additives: 10 ml MVI, 1 ml trace elements  RD Assessment: Estimated Needs Total Energy Estimated Needs: 1800-2000 Total Protein Estimated Needs: 90-100 grams Total Fluid Estimated Needs: >1.8L  Current Nutrition:  NPO and TPN  Plan:  Patient to be discharged today per discussion with IMTS ([place]). No TPN bag will be made. Communicated discharge plan with [facility] for home TPN plans.   Arturo Morton, PharmD, BCPS Please check AMION for all [facility] contact numbers Clinical Pharmacist 08/23/2021 8:16 AM

## 2021-08-23 NOTE — Evaluation (Signed)
Occupational Therapy Evaluation Patient Details Name: Amanda Hendricks MRN: [id number] DOB: Jul 05, 1966 Today's Date: 08/23/2021   History of Present Illness Pt is a 56 y.o. F who presents with increased abdominal pain. History of GIB during recent prolonged hospitalization as well as history of duodenal stricturing, cholecystectomy, and Roux-en-Y procedure in 2015. MRCP significant for: Severe intra and extrahepatic biliary ductal dilatation without evidence of obstructing choledocholithiasis or mass in the head of the pancreas; pancreatic divisum anatomy; multilobar pneumonia most severe in the left lower lobe with adjacent small left parapneumonic pleural effusion; hepatosplenomegaly. Significant PMH: left THA 06/2021, gastric bypass surgery (2015), Cushing disease, osteoporosis, HTN, hypothyroidism, bipolar 1 disorder.   Clinical Impression   Pt presents with decline in function and safety with ADLs and ADL mobility with impaired balance and endurance. PTA pt lives at home with he mother and her son moved in to help out recently. Pt was Ind with ADLs/selfcare, home mgt and mobility prior to hospitalization at [facility] and was at a rehab facility. Pt currently  requires min A with LB bathing and dressing, set up/Sup with grooming and UB ADLs, Sup with toileting and Sup with mobility using RW. Pt would benefit from acute OT services to address impairments to maximize level of function and safety     Recommendations for follow up therapy are one component of a multi-disciplinary discharge planning process, led by the attending physician.  Recommendations may be updated based on patient status, additional functional criteria and insurance authorization.   Follow Up Recommendations  No OT follow up    Assistance Recommended at Discharge Set up Supervision/Assistance  Patient can return home with the following A little help with bathing/dressing/bathroom;Assistance with cooking/housework;Assist for  transportation    Functional Status Assessment  Patient has had a recent decline in their functional status and demonstrates the ability to make significant improvements in function in a reasonable and predictable amount of time.  Equipment Recommendations  None recommended by OT    Recommendations for Other Services       Precautions / Restrictions Precautions Precautions: Fall Restrictions Weight Bearing Restrictions: No      Mobility Bed Mobility Overal bed mobility: Modified Independent                  Transfers Overall transfer level: Modified independent Equipment used: Rolling walker (2 wheels)                      Balance Overall balance assessment: Needs assistance Sitting-balance support: Feet supported Sitting balance-Leahy Scale: Normal     Standing balance support: No upper extremity supported;During functional activity Standing balance-Leahy Scale: Fair Standing balance comment: washing hands at sink with supervision                           ADL either performed or assessed with clinical judgement   ADL Overall ADL's : Needs assistance/impaired                                       General ADL Comments: min A with LB bathing and dressing, set up/Sup with grooming and UB ADLs, Sup with toileting     Vision Baseline Vision/History: 1 Wears glasses Ability to See in Adequate Light: 0 Adequate Patient Visual Report: No change from baseline       Perception  Praxis      Pertinent Vitals/Pain Pain Assessment: No/denies pain Faces Pain Scale: Hurts little more Breathing: normal Facial Expression: facial grimacing Body Language: relaxed Pain Location: L hip Pain Descriptors / Indicators: Discomfort;Grimacing;Guarding;Aching Pain Intervention(s): Monitored during session;Repositioned;Premedicated before session     Hand Dominance Right   Extremity/Trunk Assessment Upper Extremity  Assessment Upper Extremity Assessment: Overall WFL for tasks assessed   Lower Extremity Assessment Lower Extremity Assessment: Defer to PT evaluation   Cervical / Trunk Assessment Cervical / Trunk Assessment: Kyphotic   Communication Communication Communication: No difficulties   Cognition Arousal/Alertness: Awake/alert Behavior During Therapy: WFL for tasks assessed/performed Overall Cognitive Status: Within Functional Limits for tasks assessed                                       General Comments       Exercises Exercises: General Lower Extremity General Exercises - Lower Extremity Quad Sets: Left;10 reps;Supine Heel Slides: Left;10 reps;Supine Hip ABduction/ADduction: Left;10 reps;Supine Straight Leg Raises: AAROM;Left;10 reps;Supine   Shoulder Instructions      Home Living Family/patient expects to be discharged to:: Private residence Living Arrangements: Parent;Children Available Help at Discharge: Family Type of Home: House Home Access: Ramped entrance     Home Layout: One level     Bathroom Shower/Tub: Teacher, early years/pre: Standard     Home Equipment: Conservation officer, nature (2 wheels)   Additional Comments: Pt son recently moved in to help out. Pt home for short period following long hospitalization at [facility]. L THA 06/15/2021      Prior Functioning/Environment Prior Level of Function : Independent/Modified Independent             Mobility Comments: using RW ADLs Comments: reports independent        OT Problem List: Impaired balance (sitting and/or standing);Decreased activity tolerance;Decreased knowledge of use of DME or AE      OT Treatment/Interventions:      OT Goals(Current goals can be found in the care plan section) Acute Rehab OT Goals Patient Stated Goal: go home OT Goal Formulation: With patient ADL Goals Pt Will Perform Lower Body Bathing: with min guard assist;with supervision;with adaptive equipment Pt  Will Perform Lower Body Dressing: with min guard assist;with supervision;with adaptive equipment Pt Will Transfer to Toilet: with modified independence;ambulating  OT Frequency:      Co-evaluation              AM-PAC OT "6 Clicks" Daily Activity     Outcome Measure Help from another person eating meals?: None Help from another person taking care of personal grooming?: A Little Help from another person toileting, which includes using toliet, bedpan, or urinal?: A Little Help from another person bathing (including washing, rinsing, drying)?: A Little Help from another person to put on and taking off regular upper body clothing?: None Help from another person to put on and taking off regular lower body clothing?: A Little 6 Click Score: 20   End of Session Equipment Utilized During Treatment: Rolling walker (2 wheels);Other (comment) (3 in 1 over toilet)  Activity Tolerance: Patient tolerated treatment well Patient left: in bed;with call bell/phone within reach  OT Visit Diagnosis: Other abnormalities of gait and mobility (R26.89);History of falling (Z91.81);Pain;Unsteadiness on feet (R26.81) Pain - Right/Left: Left Pain - part of body: Hip  Time: 9584-4171 OT Time Calculation (min): 20 min Charges:  OT General Charges $OT Visit: 1 Visit OT Evaluation $OT Eval Moderate Complexity: 1 Mod   Britt Bottom 08/23/2021, 1:43 PM

## 2021-08-25 LAB — COPPER, SERUM: Copper: 117 ug/dL (ref 80–158)

## 2021-08-25 LAB — VITAMIN A: Vitamin A (Retinoic Acid): 18.2 ug/dL — ABNORMAL LOW (ref 20.1–62.0)

## 2021-08-25 LAB — VITAMIN C: Vitamin C: 0.7 mg/dL (ref 0.4–2.0)

## 2021-08-26 LAB — VITAMIN B1: Vitamin B1 (Thiamine): 128.2 nmol/L (ref 66.5–200.0)

## 2021-08-26 LAB — ZINC: Zinc: 30 ug/dL — ABNORMAL LOW (ref 44–115)

## 2021-08-27 ENCOUNTER — Encounter (HOSPITAL_COMMUNITY): Payer: Self-pay | Admitting: Emergency Medicine

## 2021-08-27 ENCOUNTER — Inpatient Hospital Stay (HOSPITAL_COMMUNITY)
Admission: EM | Admit: 2021-08-27 | Discharge: 2021-08-30 | DRG: 177 | Disposition: A | Discharge status: Home Health | Payer: BC Managed Care – PPO | Attending: Internal Medicine | Admitting: Internal Medicine

## 2021-08-27 ENCOUNTER — Emergency Department (HOSPITAL_COMMUNITY): Payer: BC Managed Care – PPO

## 2021-08-27 ENCOUNTER — Other Ambulatory Visit: Payer: Self-pay

## 2021-08-27 DIAGNOSIS — Z20822 Contact with and (suspected) exposure to covid-19: Secondary | ICD-10-CM | POA: Diagnosis present

## 2021-08-27 DIAGNOSIS — J449 Chronic obstructive pulmonary disease, unspecified: Secondary | ICD-10-CM | POA: Diagnosis present

## 2021-08-27 DIAGNOSIS — J690 Pneumonitis due to inhalation of food and vomit: Principal | ICD-10-CM | POA: Diagnosis present

## 2021-08-27 DIAGNOSIS — M810 Age-related osteoporosis without current pathological fracture: Secondary | ICD-10-CM | POA: Diagnosis present

## 2021-08-27 DIAGNOSIS — Z9884 Bariatric surgery status: Secondary | ICD-10-CM

## 2021-08-27 DIAGNOSIS — E039 Hypothyroidism, unspecified: Secondary | ICD-10-CM | POA: Diagnosis present

## 2021-08-27 DIAGNOSIS — E876 Hypokalemia: Secondary | ICD-10-CM | POA: Diagnosis present

## 2021-08-27 DIAGNOSIS — Z886 Allergy status to analgesic agent status: Secondary | ICD-10-CM

## 2021-08-27 DIAGNOSIS — X58XXXD Exposure to other specified factors, subsequent encounter: Secondary | ICD-10-CM | POA: Diagnosis present

## 2021-08-27 DIAGNOSIS — Z79891 Long term (current) use of opiate analgesic: Secondary | ICD-10-CM

## 2021-08-27 DIAGNOSIS — Z888 Allergy status to other drugs, medicaments and biological substances status: Secondary | ICD-10-CM

## 2021-08-27 DIAGNOSIS — S72002D Fracture of unspecified part of neck of left femur, subsequent encounter for closed fracture with routine healing: Secondary | ICD-10-CM

## 2021-08-27 DIAGNOSIS — J189 Pneumonia, unspecified organism: Secondary | ICD-10-CM | POA: Diagnosis not present

## 2021-08-27 DIAGNOSIS — S72009A Fracture of unspecified part of neck of unspecified femur, initial encounter for closed fracture: Secondary | ICD-10-CM

## 2021-08-27 DIAGNOSIS — I10 Essential (primary) hypertension: Secondary | ICD-10-CM | POA: Diagnosis present

## 2021-08-27 DIAGNOSIS — Z79899 Other long term (current) drug therapy: Secondary | ICD-10-CM

## 2021-08-27 DIAGNOSIS — E43 Unspecified severe protein-calorie malnutrition: Secondary | ICD-10-CM | POA: Diagnosis present

## 2021-08-27 DIAGNOSIS — J9601 Acute respiratory failure with hypoxia: Secondary | ICD-10-CM | POA: Diagnosis present

## 2021-08-27 DIAGNOSIS — Z681 Body mass index (BMI) 19 or less, adult: Secondary | ICD-10-CM

## 2021-08-27 DIAGNOSIS — K56699 Other intestinal obstruction unspecified as to partial versus complete obstruction: Secondary | ICD-10-CM | POA: Diagnosis present

## 2021-08-27 DIAGNOSIS — F319 Bipolar disorder, unspecified: Secondary | ICD-10-CM | POA: Diagnosis present

## 2021-08-27 DIAGNOSIS — J439 Emphysema, unspecified: Secondary | ICD-10-CM | POA: Diagnosis present

## 2021-08-27 DIAGNOSIS — E46 Unspecified protein-calorie malnutrition: Secondary | ICD-10-CM

## 2021-08-27 LAB — COMPREHENSIVE METABOLIC PANEL
ALT: 29 U/L (ref 0–44)
AST: 18 U/L (ref 15–41)
Albumin: 2.3 g/dL — ABNORMAL LOW (ref 3.5–5.0)
Alkaline Phosphatase: 340 U/L — ABNORMAL HIGH (ref 38–126)
Anion gap: 7 (ref 5–15)
BUN: 11 mg/dL (ref 6–20)
CO2: 28 mmol/L (ref 22–32)
Calcium: 9.1 mg/dL (ref 8.9–10.3)
Chloride: 97 mmol/L — ABNORMAL LOW (ref 98–111)
Creatinine, Ser: 0.36 mg/dL — ABNORMAL LOW (ref 0.44–1.00)
GFR, Estimated: >60 mL/min (ref >60)
Glucose, Bld: 94 mg/dL (ref 70–99)
Potassium: 3 mmol/L — ABNORMAL LOW (ref 3.5–5.1)
Sodium: 132 mmol/L — ABNORMAL LOW (ref 135–145)
Total Bilirubin: 0.3 mg/dL (ref 0.3–1.2)
Total Protein: 5.9 g/dL — ABNORMAL LOW (ref 6.5–8.1)

## 2021-08-27 LAB — LIPASE, BLOOD: Lipase: 19 U/L (ref 11–51)

## 2021-08-27 LAB — CBC WITH DIFFERENTIAL/PLATELET
Abs Immature Granulocytes: 0.06 10*3/uL (ref 0.00–0.07)
Basophils Absolute: 0 10*3/uL (ref 0.0–0.1)
Basophils Relative: 0 %
Eosinophils Absolute: 0.1 10*3/uL (ref 0.0–0.5)
Eosinophils Relative: 1 %
HCT: 30.5 % — ABNORMAL LOW (ref 36.0–46.0)
Hemoglobin: 9.3 g/dL — ABNORMAL LOW (ref 12.0–15.0)
Immature Granulocytes: 1 %
Lymphocytes Relative: 23 %
Lymphs Abs: 1.8 10*3/uL (ref 0.7–4.0)
MCH: 27.2 pg (ref 26.0–34.0)
MCHC: 30.5 g/dL (ref 30.0–36.0)
MCV: 89.2 fL (ref 80.0–100.0)
Monocytes Absolute: 0.6 10*3/uL (ref 0.1–1.0)
Monocytes Relative: 8 %
Neutro Abs: 5.2 10*3/uL (ref 1.7–7.7)
Neutrophils Relative %: 67 %
Platelets: 353 10*3/uL (ref 150–400)
RBC: 3.42 MIL/uL — ABNORMAL LOW (ref 3.87–5.11)
RDW: 14 % (ref 11.5–15.5)
WBC: 7.7 10*3/uL (ref 4.0–10.5)
nRBC: 0 % (ref 0.0–0.2)

## 2021-08-27 MED ORDER — ACETAMINOPHEN 500 MG PO TABS: 1000.0000 mg | ORAL_TABLET | Freq: Once | ORAL | Status: DC

## 2021-08-27 NOTE — ED Triage Notes (Addendum)
Pt states she was discharged from hospital on Wednesday. Reports worsening nausea and vomiting.  Chronic abd pain, hip pain, and back pain.  O2 sats 89% on room air.  States she has oxygen at home but doesn't wear it and needs to call to have it checked out.  Placed on 2 liters [place].

## 2021-08-27 NOTE — ED Provider Triage Note (Signed)
Emergency Medicine Provider Triage Evaluation Note  Amanda Hendricks , a 56 y.o. female  was evaluated in triage.  Pt complains of worsening nausea and vomiting over the past 2 days.  She reports recent protracted hospitalizations for hip fracture and GI bleeding.  She has tenderness and pain in the upper abdomen.  States that she is unable to keep down medications for nausea and for pain.  Noted to be hypoxic in the upper 80s in triage, started on oxygen by nasal cannula.    Review of Systems  Positive: Nausea and vomiting  Negative: Fever  Physical Exam  BP (!) 158/93 (BP Location: Left Arm)    Pulse (!) 107    Temp 98.7 F (37.1 C) (Oral)    Resp 18    SpO2 (!) 89%  Gen:   Awake, appears uncomfortable Resp:  Normal effort  MSK:   Moves extremities without difficulty  Other:  Mild tenderness epigastrium and right upper quadrant without rebound or guarding  Medical Decision Making  Medically screening exam initiated at 4:30 PM.  Appropriate orders placed.  Amanda Hendricks was informed that the remainder of the evaluation will be completed by another provider, this initial triage assessment does not replace that evaluation, and the importance of remaining in the ED until their evaluation is complete.     Carlisle Cater, PA-C 08/27/21 1631

## 2021-08-27 NOTE — ED Notes (Signed)
O2 tanked replaced with fresh one.

## 2021-08-28 DIAGNOSIS — S72009A Fracture of unspecified part of neck of unspecified femur, initial encounter for closed fracture: Secondary | ICD-10-CM

## 2021-08-28 DIAGNOSIS — J690 Pneumonitis due to inhalation of food and vomit: Secondary | ICD-10-CM

## 2021-08-28 DIAGNOSIS — J189 Pneumonia, unspecified organism: Secondary | ICD-10-CM | POA: Insufficient documentation

## 2021-08-28 LAB — URINALYSIS, MICROSCOPIC (REFLEX): Bacteria, UA: NONE SEEN

## 2021-08-28 LAB — BASIC METABOLIC PANEL
Anion gap: 12 (ref 5–15)
Anion gap: 6 (ref 5–15)
BUN: 8 mg/dL (ref 6–20)
BUN: 8 mg/dL (ref 6–20)
CO2: 26 mmol/L (ref 22–32)
CO2: 29 mmol/L (ref 22–32)
Calcium: 8.6 mg/dL — ABNORMAL LOW (ref 8.9–10.3)
Calcium: 8.3 mg/dL — ABNORMAL LOW (ref 8.9–10.3)
Chloride: 95 mmol/L — ABNORMAL LOW (ref 98–111)
Chloride: 93 mmol/L — ABNORMAL LOW (ref 98–111)
Creatinine, Ser: 0.44 mg/dL (ref 0.44–1.00)
Creatinine, Ser: 0.4 mg/dL — ABNORMAL LOW (ref 0.44–1.00)
GFR, Estimated: >60 mL/min (ref >60)
GFR, Estimated: >60 mL/min (ref >60)
Glucose, Bld: 113 mg/dL — ABNORMAL HIGH (ref 70–99)
Glucose, Bld: 301 mg/dL — ABNORMAL HIGH (ref 70–99)
Potassium: 2.7 mmol/L — CL (ref 3.5–5.1)
Potassium: 4.7 mmol/L (ref 3.5–5.1)
Sodium: 133 mmol/L — ABNORMAL LOW (ref 135–145)
Sodium: 128 mmol/L — ABNORMAL LOW (ref 135–145)

## 2021-08-28 LAB — TROPONIN I (HIGH SENSITIVITY)
Troponin I (High Sensitivity): 17 ng/L (ref <18)
Troponin I (High Sensitivity): 13 ng/L (ref <18)

## 2021-08-28 LAB — URINALYSIS, ROUTINE W REFLEX MICROSCOPIC
Bilirubin Urine: NEGATIVE
Glucose, UA: NEGATIVE mg/dL
Hgb urine dipstick: NEGATIVE
Ketones, ur: 15 mg/dL — AB
Leukocytes,Ua: NEGATIVE
Nitrite: NEGATIVE
Protein, ur: 30 mg/dL — AB
Specific Gravity, Urine: >1.03 — ABNORMAL HIGH (ref 1.005–1.030)
pH: 6.5 (ref 5.0–8.0)

## 2021-08-28 LAB — RESP PANEL BY RT-PCR (FLU A&B, COVID) ARPGX2
Influenza A by PCR: NEGATIVE
Influenza B by PCR: NEGATIVE
SARS Coronavirus 2 by RT PCR: NEGATIVE

## 2021-08-28 LAB — PHOSPHORUS: Phosphorus: 3.4 mg/dL (ref 2.5–4.6)

## 2021-08-28 LAB — CBG MONITORING, ED
Glucose-Capillary: 54 mg/dL — ABNORMAL LOW (ref 70–99)
Glucose-Capillary: 61 mg/dL — ABNORMAL LOW (ref 70–99)
Glucose-Capillary: 125 mg/dL — ABNORMAL HIGH (ref 70–99)

## 2021-08-28 LAB — MAGNESIUM: Magnesium: 1.1 mg/dL — ABNORMAL LOW (ref 1.7–2.4)

## 2021-08-28 MED ORDER — ASPIRIN EC 81 MG PO TBEC: 81.0000 mg | DELAYED_RELEASE_TABLET | Freq: Every day | ORAL | Status: DC

## 2021-08-28 MED ORDER — DIPHENHYDRAMINE HCL 50 MG/ML IJ SOLN
25.0000 mg | Freq: Once | INTRAMUSCULAR | Status: AC
  Administered 2021-08-28: 25 mg via INTRAVENOUS
  Filled 2021-08-28: qty 1

## 2021-08-28 MED ORDER — POTASSIUM CHLORIDE 10 MEQ/100ML IV SOLN
10.0000 meq | INTRAVENOUS | Status: AC
  Administered 2021-08-28 (×4): 10 meq via INTRAVENOUS
  Filled 2021-08-28 (×3): qty 100

## 2021-08-28 MED ORDER — QUETIAPINE FUMARATE 100 MG PO TABS
300.0000 mg | ORAL_TABLET | Freq: Every day | ORAL | Status: DC
  Administered 2021-08-28 – 2021-08-29 (×2): 300 mg via ORAL
  Filled 2021-08-28 (×2): qty 1
  Filled 2021-08-28: qty 3
  Filled 2021-08-28: qty 1

## 2021-08-28 MED ORDER — SODIUM CHLORIDE 0.9% FLUSH: 10.0000 mL | INTRAVENOUS | Status: DC | PRN

## 2021-08-28 MED ORDER — SUCRALFATE 1 GM/10ML PO SUSP
1.0000 g | Freq: Three times a day (TID) | ORAL | Status: DC
  Administered 2021-08-28 – 2021-08-30 (×7): 1 g via ORAL
  Filled 2021-08-28 (×9): qty 10

## 2021-08-28 MED ORDER — CHLORHEXIDINE GLUCONATE CLOTH 2 % EX PADS
6.0000 | MEDICATED_PAD | Freq: Every day | CUTANEOUS | Status: DC
  Administered 2021-08-30: 6 via TOPICAL

## 2021-08-28 MED ORDER — DROPERIDOL 2.5 MG/ML IJ SOLN
1.2500 mg | Freq: Once | INTRAMUSCULAR | Status: AC
  Administered 2021-08-28: 1.25 mg via INTRAVENOUS
  Filled 2021-08-28: qty 2

## 2021-08-28 MED ORDER — PANTOPRAZOLE SODIUM 40 MG PO TBEC
40.0000 mg | DELAYED_RELEASE_TABLET | Freq: Two times a day (BID) | ORAL | Status: DC
  Administered 2021-08-28 – 2021-08-30 (×5): 40 mg via ORAL
  Filled 2021-08-28 (×4): qty 1

## 2021-08-28 MED ORDER — MISOPROSTOL 200 MCG PO TABS
200.0000 ug | ORAL_TABLET | Freq: Four times a day (QID) | ORAL | Status: DC
  Administered 2021-08-28 – 2021-08-30 (×8): 200 ug via ORAL
  Filled 2021-08-28 (×10): qty 1

## 2021-08-28 MED ORDER — TRAVASOL 10 % IV SOLN
INTRAVENOUS | Status: AC
  Filled 2021-08-28: qty 352.8

## 2021-08-28 MED ORDER — HYDROMORPHONE HCL 1 MG/ML IJ SOLN
0.5000 mg | INTRAMUSCULAR | Status: DC | PRN
  Administered 2021-08-28: 0.5 mg via INTRAVENOUS
  Filled 2021-08-28: qty 1

## 2021-08-28 MED ORDER — DEXTROSE 50 % IV SOLN
1.0000 | Freq: Once | INTRAVENOUS | Status: AC
  Administered 2021-08-28: 50 mL via INTRAVENOUS
  Filled 2021-08-28: qty 50

## 2021-08-28 MED ORDER — NITROGLYCERIN 0.4 MG SL SUBL: 0.4000 mg | SUBLINGUAL_TABLET | SUBLINGUAL | Status: DC | PRN

## 2021-08-28 MED ORDER — SODIUM CHLORIDE 0.9 % IV SOLN
1.0000 g | Freq: Once | INTRAVENOUS | Status: AC
  Administered 2021-08-28: 1 g via INTRAVENOUS
  Filled 2021-08-28: qty 10

## 2021-08-28 MED ORDER — ENOXAPARIN SODIUM 40 MG/0.4ML IJ SOSY
40.0000 mg | PREFILLED_SYRINGE | Freq: Every day | INTRAMUSCULAR | Status: DC
  Administered 2021-08-28 – 2021-08-30 (×3): 40 mg via SUBCUTANEOUS
  Filled 2021-08-28 (×2): qty 0.4

## 2021-08-28 MED ORDER — SENNOSIDES-DOCUSATE SODIUM 8.6-50 MG PO TABS: 1.0000 | ORAL_TABLET | Freq: Every evening | ORAL | Status: DC | PRN

## 2021-08-28 MED ORDER — HYDROMORPHONE HCL 1 MG/ML IJ SOLN
1.0000 mg | Freq: Once | INTRAMUSCULAR | Status: AC
  Administered 2021-08-28: 1 mg via INTRAVENOUS
  Filled 2021-08-28: qty 1

## 2021-08-28 MED ORDER — SODIUM CHLORIDE 0.9 % IV SOLN
500.0000 mg | Freq: Once | INTRAVENOUS | Status: AC
  Administered 2021-08-28: 500 mg via INTRAVENOUS

## 2021-08-28 MED ORDER — OXYCODONE HCL 5 MG PO TABS
5.0000 mg | ORAL_TABLET | ORAL | Status: DC
  Administered 2021-08-28 – 2021-08-30 (×11): 5 mg via ORAL
  Filled 2021-08-28 (×12): qty 1

## 2021-08-28 MED ORDER — INSULIN ASPART 100 UNIT/ML IJ SOLN: 0.0000 [IU] | Freq: Four times a day (QID) | INTRAMUSCULAR | Status: DC

## 2021-08-28 MED ORDER — SODIUM CHLORIDE 0.9 % IV BOLUS
1000.0000 mL | Freq: Once | INTRAVENOUS | Status: AC
  Administered 2021-08-28: 1000 mL via INTRAVENOUS

## 2021-08-28 MED ORDER — HYDROMORPHONE HCL 1 MG/ML IJ SOLN
1.0000 mg | INTRAMUSCULAR | Status: DC | PRN
  Administered 2021-08-28 – 2021-08-30 (×8): 1 mg via INTRAVENOUS
  Filled 2021-08-28 (×9): qty 1

## 2021-08-28 MED ORDER — ASPIRIN 325 MG PO TABS
325.0000 mg | ORAL_TABLET | Freq: Every day | ORAL | Status: DC
  Administered 2021-08-28: 325 mg via ORAL
  Filled 2021-08-28: qty 1

## 2021-08-28 MED ORDER — MAGNESIUM SULFATE 4 GM/100ML IV SOLN
4.0000 g | Freq: Once | INTRAVENOUS | Status: AC
  Administered 2021-08-28: 4 g via INTRAVENOUS
  Filled 2021-08-28: qty 100

## 2021-08-28 MED ORDER — DEXTROSE-NACL 5-0.9 % IV SOLN: INTRAVENOUS | Status: DC

## 2021-08-28 MED ORDER — LACTATED RINGERS IV SOLN: INTRAVENOUS | Status: DC

## 2021-08-28 MED ORDER — SODIUM CHLORIDE 0.9 % IV SOLN
3.0000 g | Freq: Four times a day (QID) | INTRAVENOUS | Status: DC
  Administered 2021-08-28 – 2021-08-30 (×7): 3 g via INTRAVENOUS
  Filled 2021-08-28 (×10): qty 8

## 2021-08-28 MED ORDER — DULOXETINE HCL 30 MG PO CPEP
30.0000 mg | ORAL_CAPSULE | Freq: Every day | ORAL | Status: DC
  Administered 2021-08-29 – 2021-08-30 (×2): 30 mg via ORAL
  Filled 2021-08-28 (×2): qty 1

## 2021-08-28 MED ORDER — LEVOTHYROXINE SODIUM 100 MCG PO TABS
100.0000 ug | ORAL_TABLET | Freq: Every day | ORAL | Status: DC
  Administered 2021-08-29 – 2021-08-30 (×2): 100 ug via ORAL
  Filled 2021-08-28 (×2): qty 1

## 2021-08-28 NOTE — ED Provider Notes (Signed)
[place] EMERGENCY DEPARTMENT Provider Note   CSN: [id number] Arrival date & time: 08/27/21  1554     History  Chief Complaint  Patient presents with   Vomiting    Amanda Hendricks is a 56 y.o. female.  56 yo F with a chief complaint of nausea and vomiting.  The patient unfortunately was just in the hospital and was admitted for nausea and vomiting after trying to ingest some [employer].  She has had a Roux-en-Y bypass and has unfortunately been having complications.  She is chronically on narcotics and has been unable to take them due to the nausea and vomiting.  Unable to keep her nausea medicine down as well.  She also was treated with antibiotics for possible pneumonia and she has not been able to take that since Friday.  No fevers or chills.  Does feel a bit short of breath and has had persistent coughing.  Upper abdominal discomfort that she has chronically is got little bit worse as well as her right hip pain.  The history is provided by the patient and a friend.  Illness Severity:  Moderate Onset quality:  Gradual Duration:  3 days Timing:  Constant Progression:  Worsening Chronicity:  Recurrent Associated symptoms: abdominal pain, congestion, cough, nausea, shortness of breath and vomiting   Associated symptoms: no chest pain, no fever, no headaches, no myalgias, no rhinorrhea and no wheezing       Home Medications Prior to Admission medications   Medication Sig Start Date End Date Taking? Authorizing Provider  acetaminophen (TYLENOL) 325 MG tablet Take 650 mg by mouth every 6 (six) hours as needed for mild pain or headache. 08/17/21   [provider]  calcium carbonate (OS-CAL) 1250 (500 Ca) MG chewable tablet Chew 500 mg by mouth 3 (three) times daily as needed for heartburn.    [provider]  divalproex (DEPAKOTE) 250 MG DR tablet Take 250 mg by mouth 2 (two) times daily. 08/17/21   [provider]  doxycycline (VIBRA-TABS)  100 MG tablet Take 1 tablet (100 mg total) by mouth every 12 (twelve) hours for 5 days. 08/23/21 08/28/21  Sanjuan Dame, MD  DULoxetine (CYMBALTA) 30 MG capsule Take 30 mg by mouth daily. 08/17/21 08/17/22  [provider]  ferrous sulfate 325 (65 FE) MG EC tablet Take 1 tablet by mouth daily with breakfast. 08/17/21   [provider]  levothyroxine (SYNTHROID) 100 MCG tablet Take 100 mcg by mouth daily. 02/21/21 02/21/22  [provider]  Magnesium Chloride 64 MG TBEC Take 535 mg by mouth in the morning and at bedtime. 08/17/21   [provider]  methocarbamol (ROBAXIN) 500 MG tablet Take 1,000 mg by mouth every 8 (eight) hours as needed for muscle spasms.    [provider]  metoprolol tartrate (LOPRESSOR) 25 MG tablet Take 25 mg by mouth in the morning and at bedtime. 04/28/21 04/28/22  [provider]  misoprostol (CYTOTEC) 200 MCG tablet Take 200 mcg by mouth 4 (four) times daily. 08/17/21   [provider]  naloxone [facility]) nasal spray 4 mg/0.1 mL Place 0.4 mg into the nose as needed (accidental overdose). 02/09/20   [provider]  ondansetron (ZOFRAN-ODT) 4 MG disintegrating tablet Take 4 mg by mouth every 8 (eight) hours as needed for nausea or vomiting. 08/17/21   [provider]  oxyCODONE (OXY IR/ROXICODONE) 5 MG immediate release tablet Take 5 mg by mouth every 4 (four) hours as needed for moderate  pain.    [provider]  pantoprazole (PROTONIX) 40 MG tablet Take 40 mg by mouth 2 (two) times daily. 08/17/21   [provider]  polyethylene glycol powder (GLYCOLAX/MIRALAX) 17 GM/SCOOP powder Take 17 g by mouth daily as needed for mild constipation. 08/17/21   [provider]  prochlorperazine (COMPAZINE) 5 MG tablet Take 5 mg by mouth every 8 (eight) hours as needed for nausea or vomiting. 08/17/21   [provider]  promethazine (PHENERGAN) 12.5 MG tablet Take 12.5 mg by mouth every 8  (eight) hours as needed for nausea or vomiting. 08/17/21   [provider]  QUEtiapine (SEROQUEL) 300 MG tablet Take 300 mg by mouth at bedtime. 08/17/21   [provider]  senna-docusate (SENOKOT-S) 8.6-50 MG tablet Take 2 tablets by mouth 2 (two) times daily as needed for mild constipation. 08/17/21   [provider]  spironolactone (ALDACTONE) 25 MG tablet Take 25 mg by mouth daily. 08/17/21   [provider]  sucralfate (CARAFATE) 1 GM/10ML suspension Take 10 mLs by mouth 4 (four) times daily -  before meals and at bedtime. 08/17/21 02/13/22  [provider]      Allergies    Lisinopril    Review of Systems   Review of Systems  Constitutional:  Negative for chills and fever.  HENT:  Positive for congestion. Negative for rhinorrhea.   Eyes:  Negative for redness and visual disturbance.  Respiratory:  Positive for cough and shortness of breath. Negative for wheezing.   Cardiovascular:  Negative for chest pain and palpitations.  Gastrointestinal:  Positive for abdominal pain, nausea and vomiting.  Genitourinary:  Negative for dysuria and urgency.  Musculoskeletal:  Negative for arthralgias and myalgias.  Skin:  Negative for pallor and wound.  Neurological:  Negative for dizziness and headaches.   Physical Exam Updated Vital Signs BP (!) 147/89    Pulse 88    Temp 98.7 F (37.1 C) (Oral)    Resp 18    SpO2 97%  Physical Exam Vitals and nursing note reviewed.  Constitutional:      General: She is not in acute distress.    Appearance: She is well-developed. She is not diaphoretic.     Comments: Cachectic, chronically ill-appearing.  HENT:     Head: Normocephalic and atraumatic.  Eyes:     Pupils: Pupils are equal, round, and reactive to light.  Cardiovascular:     Rate and Rhythm: Normal rate and regular rhythm.     Heart sounds: No murmur heard.   No friction rub. No gallop.  Pulmonary:     Effort: Pulmonary effort is normal.     Breath  sounds: No wheezing or rales.  Abdominal:     General: There is no distension.     Palpations: Abdomen is soft.     Tenderness: There is no abdominal tenderness.  Musculoskeletal:        General: No tenderness.     Cervical back: Normal range of motion and neck supple.  Skin:    General: Skin is warm and dry.  Neurological:     Mental Status: She is alert and oriented to person, place, and time.  Psychiatric:        Behavior: Behavior normal.    ED Results / Procedures / Treatments   Labs (all labs ordered are listed, but only abnormal results are displayed) Labs Reviewed  CBC WITH DIFFERENTIAL/PLATELET - Abnormal; Notable for the following components:  Result Value   RBC 3.42 (*)    Hemoglobin 9.3 (*)    HCT 30.5 (*)    All other components within normal limits  COMPREHENSIVE METABOLIC PANEL - Abnormal; Notable for the following components:   Sodium 132 (*)    Potassium 3.0 (*)    Chloride 97 (*)    Creatinine, Ser 0.36 (*)    Total Protein 5.9 (*)    Albumin 2.3 (*)    Alkaline Phosphatase 340 (*)    All other components within normal limits  CULTURE, BLOOD (ROUTINE X 2)  CULTURE, BLOOD (ROUTINE X 2)  RESP PANEL BY RT-PCR (FLU A&B, COVID) ARPGX2  LIPASE, BLOOD  URINALYSIS, ROUTINE W REFLEX MICROSCOPIC    EKG None  Radiology DG Chest 2 View  Result Date: 08/27/2021 CLINICAL DATA:  Hypoxia. EXAM: CHEST - 2 VIEW COMPARISON:  August 21, 2021 FINDINGS: Interval development of focal airspace consolidation in the right lower thorax. Cardiomediastinal silhouette is normal. Mediastinal contours appear intact. Stable kyphotic deformity of the spine. IMPRESSION: Interval development of focal airspace consolidation in the right lower thorax, concerning for lobar pneumonia. Aspiration pneumonitis is also a possibility. Electronically Signed   By: Fidela Salisbury M.D.   On: 08/27/2021 17:07    Procedures Procedures    Medications Ordered in ED Medications   acetaminophen (TYLENOL) tablet 1,000 mg (has no administration in time range)  cefTRIAXone (ROCEPHIN) 1 g in sodium chloride 0.9 % 100 mL IVPB (has no administration in time range)  azithromycin (ZITHROMAX) 500 mg in sodium chloride 0.9 % 250 mL IVPB (has no administration in time range)  droperidol (INAPSINE) 2.5 MG/ML injection 1.25 mg (has no administration in time range)  diphenhydrAMINE (BENADRYL) injection 25 mg (has no administration in time range)  HYDROmorphone (DILAUDID) injection 1 mg (has no administration in time range)  sodium chloride 0.9 % bolus 1,000 mL (has no administration in time range)    ED Course/ Medical Decision Making/ A&P                           Medical Decision Making  56 yo F with a cc of nausea and vomiting.  Going on for 3 days.  Not able to keep her meds down.  Patient now hypoxic at rest, chest x-ray viewed by me with developing pneumonia compared to prior.  She was started on antibiotics prior to leaving with concern for possible pneumonia but as she has been unable to take her medicines and now has worsening respiratory status likely will require readmission to the hospital.  We will treat pain and nausea here.  Started on antibiotics.  IV fluids.  Will discuss with medicine for possible admission.  CRITICAL CARE Performed by: Cecilio Asper   Total critical care time: 35 minutes  Critical care time was exclusive of separately billable procedures and treating other patients.  Critical care was necessary to treat or prevent imminent or life-threatening deterioration.  Critical care was time spent personally by me on the following activities: development of treatment plan with patient and/or surrogate as well as nursing, discussions with consultants, evaluation of patient's response to treatment, examination of patient, obtaining history from patient or surrogate, ordering and performing treatments and interventions, ordering and review of  laboratory studies, ordering and review of radiographic studies, pulse oximetry and re-evaluation of patient's condition.   The patients results and plan were reviewed and discussed.   Any x-rays performed were independently reviewed  by myself.   Differential diagnosis were considered with the presenting HPI.  Medications  acetaminophen (TYLENOL) tablet 1,000 mg (has no administration in time range)  cefTRIAXone (ROCEPHIN) 1 g in sodium chloride 0.9 % 100 mL IVPB (has no administration in time range)  azithromycin (ZITHROMAX) 500 mg in sodium chloride 0.9 % 250 mL IVPB (has no administration in time range)  droperidol (INAPSINE) 2.5 MG/ML injection 1.25 mg (has no administration in time range)  diphenhydrAMINE (BENADRYL) injection 25 mg (has no administration in time range)  HYDROmorphone (DILAUDID) injection 1 mg (has no administration in time range)  sodium chloride 0.9 % bolus 1,000 mL (has no administration in time range)    Vitals:   08/27/21 2000 08/28/21 0033 08/28/21 0315 08/28/21 0420  BP: (!) 151/94 (!) 153/109 (!) 149/97 (!) 147/89  Pulse: 88 85 96 88  Resp: 16 18 18 18   Temp:      TempSrc:      SpO2: 95% 93% 95% 97%    Final diagnoses:  Community acquired pneumonia of right lower lobe of lung  Acute respiratory failure with hypoxia ([place])    Admission/ observation were discussed with the admitting physician, patient and/or family and they are comfortable with the plan.          Final Clinical Impression(s) / ED Diagnoses Final diagnoses:  Community acquired pneumonia of right lower lobe of lung  Acute respiratory failure with hypoxia [facility])    Rx / DC Orders ED Discharge Orders     None         Deno Etienne, DO 08/28/21 1829

## 2021-08-28 NOTE — ED Notes (Signed)
Patient c/o not being able to catch her breath lips cyanotic placed on 100% NRB. Resp at bedside MD contacted and came to bedside.

## 2021-08-28 NOTE — ED Notes (Signed)
RN paged MD about glucose. MD stated he will put in an amp of D50. Also per IV team, pt will not get TPN until tomorrow. MD notified.

## 2021-08-28 NOTE — ED Notes (Signed)
Patient c/o not being able to catch her breath lips cyanotic placed patient on 100

## 2021-08-28 NOTE — Progress Notes (Signed)
PHARMACY - TOTAL PARENTERAL NUTRITION CONSULT NOTE  Indication:  Intestinal stricture  Patient Measurements: Height: _0  (170.2 cm) Weight: 54.6 kg (120 lb 5.9 oz) IBW/kg (Calculated) : 61.6 TPN AdjBW (KG): 54.6 Body mass index is 18.85 kg/m.  Assessment:  68 YOF with hx Roux-en-Y gastric bypass in 2015, cholecystectomy, Cushing disease, hypothyroidism s/p R thyroid lobectomy in 2018.  She was discharged from [facility] on 1/5 after a 2 month admit s/p L hip repair. Her hospitalization was complicated by a severe intestinal stenosis at the [place] junction along with multiple severe marginal ulcerations that caused recurring GI bleeding. She was instructed to be NPO and was receiving TPN upon discharge. With improved nutrition, plan is for surgical intervention to address the segment of small bowel and the stenosis and possible reversal of Roux-en-Y.   She was discharged from [facility] on 08/23/21 after deeming that symptoms were residual from her chronic conditions.  Patient returned on 1/15 with worsening N/V.  Pharmacy consulted for TPN management.  She reports being on a 20-hr cycle TPN and only missed 1/15's dose as she was in the ED.  Glucose / Insulin: no hx DM - CBGs low normal (hypoglycemic x 1 on 1/10) last admit Electrolytes: Na/CL low 133/95, K 2.7, Mag 1.1 Renal: SCr < 1, BUN WNL Hepatic: 1/15 labs - alk phos elevated (thought d/t TPN per GI), prealbumin WNL on 1/1 Intake / Output; MIVF: N/A GI Imaging: 1/8 RUQ US/CT: scan shows some biliary ductal dilation post cholecystectomy  1/8 MRI/MRCP: intra and extrahepatic biliary ductal dilation, no choledocholithiasis GI Surgeries / Procedures: N/A  Central access: double lumen PICC 06/30/21 TPN start date: chronic, 08/28/20 this admit  Home TPN provided by [facility]: 1600 ml over 20 hr = 80 ml/hr (provides 82g A, 40g SMOFlipid 20%, 265g CHO, 1627 kCal Electrolytes/bag: Na 28 mEq, K 33mq, no Ca, Mg 38m, Phos 2.22m44m, max Cl  (330 meq) Additives: 10 ml MVI, 1 ml trace elements  Nutritional Goals: RD Assessment pending   1800-2000 kCal, 90-100g protein per day  Current Nutrition:  NPO  Plan:  Restart TPN as a continuous infusion given possible refeeding.  Cycle once stable. TPN at 30 ml/hr at 1800 (goal rate 80 ml/hr) Electrolytes in TPN: Na 100m66m, K 50mE71m Ca 22mEq/16mMg 22mEq/L49mhos 122mmol/26ml:Ac 1:1 Add standard MVI and trace elements to TPN Start sensitive SSI Q6H KCL x 6 runs as ordered Mag sulfate 4gm IV Standard TPN labs and nursing care orders  Samiyah Stupka D. Jerrin Recore, PhMina Marble, BCPS, BCCCP 1/[place]23, 11:53 AM

## 2021-08-28 NOTE — ED Notes (Signed)
RN gave pt orange juice. Will recheck sugar in 15 minutes

## 2021-08-28 NOTE — ED Notes (Signed)
Per MD, only 4 potassium chlorides are needed instead of 6.

## 2021-08-28 NOTE — H&P (Addendum)
NAME:  Amanda Hendricks, MRN:  [id number], DOB:  February 13, 1966, LOS: 0 ADMISSION DATE:  08/27/2021, Primary: Jerrol Banana., MD  CHIEF COMPLAINT:  nausea, vomiting   Medical Service: Internal Medicine Teaching Service         Attending Physician: Dr. Lucious Groves, DO    First Contact: Dr. Humphrey Rolls Pager: [phone]  Second Contact: Dr. 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 She was recently admitted to our service 1/8-1/11 for a similar issue. After discharge patient reports she felt well for the first 24 hours at home. After this, she tried two tablespoons of broth. Ms. Aron subsequently had nausea and vomiting which have persisted through today. Mentions she has not been able to take any medicine during this time. Otherwise reports having increased productive cough with white mucous. She denies fevers, chills, chest pain, dyspnea.   PCP: Jerrol Banana., MD  ED COURSE  On arrival to ED yesterday, patient afebrile, tachycardic, sating well with 2L supplemental oxygen. Labs revealed mild hypokalemia, otherwise stable from previous admission. Patient was desating during ambulation and at rest therefore IMTS was subsequently called for admission.  PAST MEDICAL HISTORY  She,  has a past medical history of History of stomach ulcers and Hypertension.   HOME MEDICATIONS   Prior to Admission medications   Medication Sig Start Date End Date Taking? Authorizing Provider  acetaminophen (TYLENOL) 325 MG tablet Take 650 mg by mouth every 6 (six) hours as needed for mild pain or headache. 08/17/21   [provider]   calcium carbonate (OS-CAL) 1250 (500 Ca) MG chewable tablet Chew 500 mg by mouth 3 (three) times daily as needed for heartburn.    [provider]  divalproex (DEPAKOTE) 250 MG DR tablet Take 250 mg by mouth 2 (two) times daily. 08/17/21   [provider]  doxycycline (VIBRA-TABS) 100 MG tablet Take 1 tablet (100 mg total) by mouth every 12 (twelve) hours for 5 days. 08/23/21 08/28/21  Sanjuan Dame, MD  DULoxetine (CYMBALTA) 30 MG capsule Take 30 mg by mouth daily. 08/17/21 08/17/22  [provider]  ferrous sulfate 325 (65 FE) MG EC tablet Take 1 tablet by mouth daily with breakfast. 08/17/21   [provider]  levothyroxine (SYNTHROID) 100 MCG tablet Take 100 mcg by mouth daily. 02/21/21 02/21/22  [provider]  Magnesium Chloride 64 MG TBEC Take 535 mg by mouth in the morning and at bedtime. 08/17/21   [provider]  methocarbamol (ROBAXIN) 500 MG tablet Take 1,000 mg by mouth every 8 (eight) hours as needed for muscle spasms.    [provider]  metoprolol tartrate (LOPRESSOR) 25 MG tablet Take 25 mg by mouth in the morning and at bedtime. 04/28/21 04/28/22  [provider]  misoprostol (CYTOTEC) 200 MCG tablet Take 200 mcg by mouth 4 (four) times daily. 08/17/21   [provider]  naloxone [facility]) nasal spray 4 mg/0.1 mL Place 0.4 mg into the nose as needed (accidental overdose). 02/09/20   [provider]  ondansetron (ZOFRAN-ODT) 4 MG disintegrating tablet Take 4 mg by mouth every 8 (eight) hours as needed  for nausea or vomiting. 08/17/21   [provider]  oxyCODONE (OXY IR/ROXICODONE) 5 MG immediate release tablet Take 5 mg by mouth every 4 (four) hours as needed for moderate pain.    [provider]  pantoprazole (PROTONIX) 40 MG tablet Take 40 mg by mouth 2 (two) times daily. 08/17/21   [provider]  polyethylene glycol powder (GLYCOLAX/MIRALAX) 17 GM/SCOOP powder Take 17 g by mouth  daily as needed for mild constipation. 08/17/21   [provider]  prochlorperazine (COMPAZINE) 5 MG tablet Take 5 mg by mouth every 8 (eight) hours as needed for nausea or vomiting. 08/17/21   [provider]  promethazine (PHENERGAN) 12.5 MG tablet Take 12.5 mg by mouth every 8 (eight) hours as needed for nausea or vomiting. 08/17/21   [provider]  QUEtiapine (SEROQUEL) 300 MG tablet Take 300 mg by mouth at bedtime. 08/17/21   [provider]  senna-docusate (SENOKOT-S) 8.6-50 MG tablet Take 2 tablets by mouth 2 (two) times daily as needed for mild constipation. 08/17/21   [provider]  spironolactone (ALDACTONE) 25 MG tablet Take 25 mg by mouth daily. 08/17/21   [provider]  sucralfate (CARAFATE) 1 GM/10ML suspension Take 10 mLs by mouth 4 (four) times daily -  before meals and at bedtime. 08/17/21 02/13/22  [provider]    ALLERGIES   Allergies as of 08/27/2021 - Review Complete 08/20/2021  Allergen Reaction Noted   Lisinopril  05/11/2013   SOCIAL HISTORY  Patient lives with her mother who helps care for her. Denies smoking, alcohol, or recreational drug use.  FAMILY HISTORY  Her family history is not on file.   REVIEW OF SYSTEMS  ROS per history of present illness.  PHYSICAL EXAMINATION  Blood pressure (!) 170/79, pulse 79, temperature 98.7 F (37.1 C), temperature source Oral, resp. rate 18, SpO2 100 %.    There were no vitals filed for this visit.  GENERAL: Chronically ill-appearing in no acute distress HENT: Normocephalic, atraumatic. Dry mucous membranes. CV: Regular rate, rhythm. No murmurs appreciated.  PULM: Normal work of breathing on room air. Clear to ausculation bilaterally. ABD:  Soft, non-distended, mildly tender throughout. Normoactive bowel sounds. MSK: Sarcopenic. No pitting edema bilateral lower extremities.   SKIN: Warm, dry. No rashes or lesions appreciated.  NEURO: Awake, alert, conversing  appropriately. PSYCH: Normal speech, mood, affect.  SIGNIFICANT DIAGNOSTIC TESTS  ECG: normal sinus rhythm, left anterior fascicular block  I personally reviewed patient's ECG with my interpretation as above.  CXR: opacification in right lower lung field  I personally reviewed patient's CXR with my interpretation as above.  LABS   CBC Latest Ref Rng & Units 08/27/2021 08/23/2021 08/22/2021  WBC 4.0 - 10.5 K/uL 7.7 3.6(L) 3.9(L)  Hemoglobin 12.0 - 15.0 g/dL 9.3(L) 7.9(L) 9.3(L)  Hematocrit 36.0 - 46.0 % 30.5(L) 25.2(L) 29.7(L)  Platelets 150 - 400 K/uL 353 201 211   BMP Latest Ref Rng & Units 08/27/2021 08/23/2021 08/22/2021  Glucose 70 - 99 mg/dL 94 92 58(L)  BUN 6 - 20 mg/dL 11 11 10   Creatinine 0.44 - 1.00 mg/dL 0.36(L) 0.47 0.46  Sodium 135 - 145 mmol/L 132(L) 131(L) 128(L)  Potassium 3.5 - 5.1 mmol/L 3.0(L) 3.3(L) 3.5  Chloride 98 - 111 mmol/L 97(L) 100 98  CO2 22 - 32 mmol/L 28 25 22   Calcium 8.9 - 10.3 mg/dL 9.1 8.1(L) 8.2(L)   ASSESSMENT  Monic Engelmann is 55yo person with previous Roux-en-Y gastric bypass now complicated with  intestinal stenosis, recent left hip fracture, osteoporosis, hypothyroidism admitted 1/16 with community acquired pneumonia.   PLAN  Principal Problem:   Aspiration pneumonia ([place]) Active Problems:   History of Roux-en-Y gastric bypass   Chronic bronchitis with emphysema (HCC)   HTN (hypertension)   Protein-calorie malnutrition (HCC)   Recent fracture of hip ([place])  #Aspiration pneumonia Imaging revealed possible pneumonia during previous admission. Once she had increase in cough during her last 1-2d prior to discharge, we initiated oral antibiotics. Unfortunately, she was unable to tolerate any PO intake. Patient reports increase in productive cough over the last few days. No fever or dyspnea over the last few days. She is also significantly deconditioned with multiple episodes of nausea and vomiting. Given her chest x-ray findings, suspect possible  aspiration pneumonia playing a role. She was initially started on ceftriaxone and azithromycin, we will transition to IV Unasyn for anaerobic coverage. - Start IV Unasyn per pharmacy - Follow-up blood cultures - Aspiration precautions - Daily CBC  #Chest pain After initial evaluation for Ms. Cespedes, received message from RN patient was becoming cyanotic w/ SpO2 69% with severe chest pain. Patient was evaluated quickly at bedside. She reports sudden-onset, heavy sternal pressure. Denies having previous pain that was similar. In addition she also felt like she could not catch her breath. Chest pain not exertional and cannot be reproduced. Initial ECG reveals left anterior fascicular block, no new Q waves or ST changes appreciated. Will give ASA and sublingual nitroglycerin for symptomatic relief and obtain troponin for further evaluation. If negative, other possibilities include sequelae of pneumonia, possible pulmonary embolism given low functional status and multiple recent hospitalizations.  - Trend troponin - ASA 366m once daily - Sublingual nitroglycerin 0.466mas needed - Tele  #Nausea, vomiting #History of Roux-en-Y gastric bypass #Intestinal stenosis Patient presenting with similar presentation regarding abdominal pain, nausea, and vomiting. This issue is unfortunately chronic and [facility] We will hold off consulting given her primary care is with [facility]. Patient appears hypovolemic, will continue supportive care. - Lactated ringers 100cc/hr - Misoprostol 20059mfour times daily - Pantoprazole 66m11mice daily - Sucralfate 1g three times daily before meals - Will need follow-up with [facility]  #Protein-calorie malnutrition Patient is severely malnourished with recent prolonged hospital stay and overall deconditioning. She is currently requiring TPN given she  cannot take much by mouth. We will have our pharmacy and RD consulted for assistance with this. - TPN per RD, pharmacy  #Hypomagnesemia  #Hypokalemia Repeat labs this morning revealed Mg 1.1, K 2.7. We will replace these, continue to monitor. - IV K & Mg - Daily BMP, Mg  #Recent L hip fracture Patient reports continued L hip pain. We will continue with her home regimen, have IV breakthrough as needed. - Oxycodone IR 5mg 27mry 4 hours - Continue home duloxetine 30mg 94my - IV dilaudid 0.5mg ev22m 4 hours as needed for breakthrough - Hold & Call MD if SBP<90, HR<65, RR<10, O2<90, or altered mental status.  #Hypertension Normotensive right now. We will hold her home medications in setting of acute illness.  - Hold home spironolactone,   #Hypothyroidism - Continue home Synthroid 100mcg d64m  #Bipolar 1 disorder - Continue home Seroquel 300mg dai41mBEST PRACTICE  DIET: NPO - pending TPN IVF: LR DVT PPX: lovenox BOWEL: Senokot-S CODE: FULL FAM COM: n/a  DISPO: Admit patient to Observation  with expected length of stay less than 2 midnights.  Sanjuan Dame, MD Internal Medicine Resident PGY-2 PAGER: [phone] 08/28/2021 9:52 AM  If after hours (below), please contact on-call pager: [phone] 5PM-7AM Monday-Friday 1PM-7AM Saturday-Sunday

## 2021-08-28 NOTE — ED Notes (Signed)
Potassium 2.7. MD and resident made aware.

## 2021-08-29 DIAGNOSIS — I10 Essential (primary) hypertension: Secondary | ICD-10-CM | POA: Diagnosis present

## 2021-08-29 DIAGNOSIS — J189 Pneumonia, unspecified organism: Secondary | ICD-10-CM | POA: Diagnosis present

## 2021-08-29 DIAGNOSIS — E43 Unspecified severe protein-calorie malnutrition: Secondary | ICD-10-CM | POA: Diagnosis present

## 2021-08-29 DIAGNOSIS — Z9884 Bariatric surgery status: Secondary | ICD-10-CM | POA: Diagnosis not present

## 2021-08-29 DIAGNOSIS — Z79891 Long term (current) use of opiate analgesic: Secondary | ICD-10-CM | POA: Diagnosis not present

## 2021-08-29 DIAGNOSIS — Z681 Body mass index (BMI) 19 or less, adult: Secondary | ICD-10-CM | POA: Diagnosis not present

## 2021-08-29 DIAGNOSIS — Z20822 Contact with and (suspected) exposure to covid-19: Secondary | ICD-10-CM | POA: Diagnosis present

## 2021-08-29 DIAGNOSIS — F319 Bipolar disorder, unspecified: Secondary | ICD-10-CM | POA: Diagnosis present

## 2021-08-29 DIAGNOSIS — J439 Emphysema, unspecified: Secondary | ICD-10-CM | POA: Diagnosis present

## 2021-08-29 DIAGNOSIS — S72002D Fracture of unspecified part of neck of left femur, subsequent encounter for closed fracture with routine healing: Secondary | ICD-10-CM | POA: Diagnosis not present

## 2021-08-29 DIAGNOSIS — K56699 Other intestinal obstruction unspecified as to partial versus complete obstruction: Secondary | ICD-10-CM | POA: Diagnosis present

## 2021-08-29 DIAGNOSIS — J9601 Acute respiratory failure with hypoxia: Secondary | ICD-10-CM

## 2021-08-29 DIAGNOSIS — Z888 Allergy status to other drugs, medicaments and biological substances status: Secondary | ICD-10-CM | POA: Diagnosis not present

## 2021-08-29 DIAGNOSIS — J690 Pneumonitis due to inhalation of food and vomit: Secondary | ICD-10-CM | POA: Diagnosis present

## 2021-08-29 DIAGNOSIS — M810 Age-related osteoporosis without current pathological fracture: Secondary | ICD-10-CM | POA: Diagnosis present

## 2021-08-29 DIAGNOSIS — E46 Unspecified protein-calorie malnutrition: Secondary | ICD-10-CM

## 2021-08-29 DIAGNOSIS — S72009A Fracture of unspecified part of neck of unspecified femur, initial encounter for closed fracture: Secondary | ICD-10-CM | POA: Diagnosis not present

## 2021-08-29 DIAGNOSIS — Z79899 Other long term (current) drug therapy: Secondary | ICD-10-CM | POA: Diagnosis not present

## 2021-08-29 DIAGNOSIS — X58XXXD Exposure to other specified factors, subsequent encounter: Secondary | ICD-10-CM | POA: Diagnosis present

## 2021-08-29 DIAGNOSIS — Z886 Allergy status to analgesic agent status: Secondary | ICD-10-CM | POA: Diagnosis not present

## 2021-08-29 DIAGNOSIS — E039 Hypothyroidism, unspecified: Secondary | ICD-10-CM | POA: Diagnosis present

## 2021-08-29 DIAGNOSIS — E876 Hypokalemia: Secondary | ICD-10-CM | POA: Diagnosis present

## 2021-08-29 LAB — CBG MONITORING, ED
Glucose-Capillary: 80 mg/dL (ref 70–99)
Glucose-Capillary: 91 mg/dL (ref 70–99)
Glucose-Capillary: 93 mg/dL (ref 70–99)

## 2021-08-29 LAB — COMPREHENSIVE METABOLIC PANEL
ALT: 28 U/L (ref 0–44)
ALT: 29 U/L (ref 0–44)
AST: 46 U/L — ABNORMAL HIGH (ref 15–41)
AST: 38 U/L (ref 15–41)
Albumin: 1.8 g/dL — ABNORMAL LOW (ref 3.5–5.0)
Albumin: 1.9 g/dL — ABNORMAL LOW (ref 3.5–5.0)
Alkaline Phosphatase: 396 U/L — ABNORMAL HIGH (ref 38–126)
Alkaline Phosphatase: 384 U/L — ABNORMAL HIGH (ref 38–126)
Anion gap: 6 (ref 5–15)
Anion gap: 4 — ABNORMAL LOW (ref 5–15)
BUN: 6 mg/dL (ref 6–20)
BUN: 6 mg/dL (ref 6–20)
CO2: 31 mmol/L (ref 22–32)
CO2: 31 mmol/L (ref 22–32)
Calcium: 8.1 mg/dL — ABNORMAL LOW (ref 8.9–10.3)
Calcium: 7.9 mg/dL — ABNORMAL LOW (ref 8.9–10.3)
Chloride: 94 mmol/L — ABNORMAL LOW (ref 98–111)
Chloride: 95 mmol/L — ABNORMAL LOW (ref 98–111)
Creatinine, Ser: 0.47 mg/dL (ref 0.44–1.00)
Creatinine, Ser: 0.46 mg/dL (ref 0.44–1.00)
GFR, Estimated: >60 mL/min (ref >60)
GFR, Estimated: >60 mL/min (ref >60)
Glucose, Bld: 362 mg/dL — ABNORMAL HIGH (ref 70–99)
Glucose, Bld: 90 mg/dL (ref 70–99)
Potassium: 4.6 mmol/L (ref 3.5–5.1)
Potassium: 3.3 mmol/L — ABNORMAL LOW (ref 3.5–5.1)
Sodium: 131 mmol/L — ABNORMAL LOW (ref 135–145)
Sodium: 130 mmol/L — ABNORMAL LOW (ref 135–145)
Total Bilirubin: 0.3 mg/dL (ref 0.3–1.2)
Total Bilirubin: 0.3 mg/dL (ref 0.3–1.2)
Total Protein: 4.5 g/dL — ABNORMAL LOW (ref 6.5–8.1)
Total Protein: 4.8 g/dL — ABNORMAL LOW (ref 6.5–8.1)

## 2021-08-29 LAB — TRIGLYCERIDES
Triglycerides: 305 mg/dL — ABNORMAL HIGH (ref <150)
Triglycerides: 170 mg/dL — ABNORMAL HIGH (ref <150)

## 2021-08-29 LAB — MAGNESIUM
Magnesium: 1.7 mg/dL (ref 1.7–2.4)
Magnesium: 1.4 mg/dL — ABNORMAL LOW (ref 1.7–2.4)

## 2021-08-29 LAB — PHOSPHORUS
Phosphorus: 4.3 mg/dL (ref 2.5–4.6)
Phosphorus: 3 mg/dL (ref 2.5–4.6)

## 2021-08-29 LAB — GLUCOSE, CAPILLARY
Glucose-Capillary: 96 mg/dL (ref 70–99)
Glucose-Capillary: 98 mg/dL (ref 70–99)

## 2021-08-29 MED ORDER — GUAIFENESIN 100 MG/5ML PO LIQD
5.0000 mL | ORAL | Status: DC | PRN
  Filled 2021-08-29: qty 5

## 2021-08-29 MED ORDER — POTASSIUM PHOSPHATES 15 MMOLE/5ML IV SOLN
10.0000 mmol | Freq: Once | INTRAVENOUS | Status: AC
  Administered 2021-08-29: 10 mmol via INTRAVENOUS
  Filled 2021-08-29 (×2): qty 3.33

## 2021-08-29 MED ORDER — POTASSIUM CHLORIDE 10 MEQ/50ML IV SOLN
10.0000 meq | INTRAVENOUS | Status: AC
  Administered 2021-08-29 (×4): 10 meq via INTRAVENOUS
  Filled 2021-08-29 (×8): qty 50

## 2021-08-29 MED ORDER — ONDANSETRON HCL 4 MG/2ML IJ SOLN
4.0000 mg | Freq: Three times a day (TID) | INTRAMUSCULAR | Status: DC | PRN
  Administered 2021-08-29: 4 mg via INTRAVENOUS
  Filled 2021-08-29: qty 2

## 2021-08-29 MED ORDER — MAGNESIUM SULFATE 4 GM/100ML IV SOLN
4.0000 g | Freq: Once | INTRAVENOUS | Status: AC
  Administered 2021-08-29: 4 g via INTRAVENOUS
  Filled 2021-08-29 (×2): qty 100

## 2021-08-29 MED ORDER — TRAVASOL 10 % IV SOLN
INTRAVENOUS | Status: DC
  Filled 2021-08-29: qty 588

## 2021-08-29 NOTE — Progress Notes (Signed)
Initial Nutrition Assessment  DOCUMENTATION CODES:   Not applicable  INTERVENTION:  -Monitor magnesium, potassium, and phosphorus BID for at least 3 days, MD to replete as needed, as pt is at risk for refeeding syndrome given h/o malnutrition, low BMI, dependence on TPN, and potentially being without TPN for several days. -Continue TPN management per Pharmacy  Recommend repletion of Vitamin A, Zinc, and Selenium given pt with deficiency -- discussed with Pharmacist   NUTRITION DIAGNOSIS:   Altered GI function related to chronic illness as evidenced by other (comment) (dependence on TPN as sole source of nutrition).  GOAL:   Patient will meet greater than or equal to 90% of their needs  MONITOR:   Labs, Weight trends, I & O's  REASON FOR ASSESSMENT:   Consult New TPN/TNA  ASSESSMENT:   Pt with PMH significant for Roux-en-Y gastric bypass in 2015, cholecystectomy, Cushing disease, hypothyroidism s/p R thyroid lobectomy in 2018.  She was discharged from [facility] on 1/5 after a 2 month admit s/p L hip repair. Her hospitalization was complicated by a severe intestinal stenosis at the [place] junction along with multiple severe marginal ulcerations that caused recurring GI bleeding. She was instructed to be NPO and was receiving TPN upon discharge. With improved nutrition, plan is for surgical intervention to address the segment of small bowel and the stenosis and possible reversal of Roux-en-Y.   Pt was discharged from [facility] on 08/23/21 after deeming that symptoms were residual from chronic conditions.  Pt returned on 1/15 with worsening N/V.  Pharmacy consulted for TPN management. Pt reports being on a 20-hr cycle TPN and only missed 1/15's dose as she was in the ED.   Unable to visit pt at this time to obtain history, though pt familiar to this RD and to TPN Pharmacist working today from recent admission. TPN initiated on 1/16 at continuous rate of 45m/hr. Plan is to increase to 53mhr tonight  as RD and Pharmacist suspect pt is refeeding. Electrolyte repletion being managed by MD/Pharmacist. Plan per Pharmacy is to cycle TPN once stable with goal rate of 8066mr in order to match home regimen. Discussed adding thiamine given likelihood of refeeding in addition to suspicion of malnutrition.   Home TPN provided by [facility]: 1600 ml over 20 hr = 80 ml/hr (provides 82g A, 40g SMOFlipid 20%, 265g CHO, 1627 kCal Electrolytes/bag: Na 28 mEq, K 21m41mno Ca, Mg 30mE57mhos 2.5mmol56max Cl (330 meq) Additives: 10 ml MVI, 1 ml trace elements  During previous admission, RD checked numerous vitamin/mineral labs due to concern for deficiency 2/2 h/o bariatric surgery. Results below: Vitamin/MIneral Profile:  Thiamine B1: WDL Vitamin B12: WDL Folate B9: WDL Vitamin A: 18.2 (L) Vitamin D: WDL Vitamin C: WDL Copper: WDL Selenium: 49 (L) Zinc: 30 (L)   Discussed deficiencies with TPN Pharmacist -- plan to replete.   UOP: 1221ml x58mours I/O: +41.8ml sin25madmit  Medications: Scheduled Meds:  Chlorhexidine Gluconate Cloth  6 each Topical Daily   DULoxetine  30 mg Oral Daily   enoxaparin (LOVENOX) injection  40 mg Subcutaneous Daily   insulin aspart  0-9 Units Subcutaneous Q6H   levothyroxine  100 mcg Oral Q0600   misoprostol  200 mcg Oral Q6H   oxyCODONE  5 mg Oral Q4H   pantoprazole  40 mg Oral BID   QUEtiapine  300 mg Oral QHS   sucralfate  1 g Oral TID WC & HS  Continuous Infusions:  ampicillin-sulbactam (UNASYN) IV Stopped (08/29/21  4967)   potassium chloride 10 mEq (08/29/21 1352)   potassium PHOSPHATE IVPB (in mmol) 10 mmol (08/29/21 1357)   TPN ADULT (ION) 30 mL/hr at 08/29/21 1100   TPN ADULT (ION)     Labs: Recent Labs  Lab 08/28/21 1023 08/28/21 2154 08/29/21 0507 08/29/21 0907  NA 133* 128* 131* 130*  K 2.7* 4.7 4.6 3.3*  CL 95* 93* 94* 95*  CO2 26 29 31 31   BUN 8 8 6 6   CREATININE 0.44 0.40* 0.47 0.46  CALCIUM 8.6* 8.3* 8.1* 7.9*  MG 1.1*   --  1.7 1.4*  PHOS 3.4  --  4.3 3.0  GLUCOSE 113* 301* 362* 90  CBGs: 54-125 x24 hours  Triglycerides 170  NUTRITION - FOCUSED PHYSICAL EXAM: Unable to complete at this time. Will attempt at follow-up.   Diet Order:   Diet Order             Diet NPO time specified  Diet effective now                   EDUCATION NEEDS:   No education needs have been identified at this time  Skin:  Skin Assessment: Reviewed RN Assessment  Last BM:  PTA  Height:   Ht Readings from Last 1 Encounters:  08/28/21 5' 7"  (1.702 m)    Weight:   Wt Readings from Last 1 Encounters:  08/28/21 54.6 kg    BMI:  Body mass index is 18.85 kg/m.  Estimated Nutritional Needs:   Kcal:  1900-2100  Protein:  95-105 grams  Fluid:  >1.9L     Theone Stanley., MS, RD, LDN (she/her/hers) RD pager number and weekend/on-call pager number located in [place].

## 2021-08-29 NOTE — ED Notes (Signed)
Got patient straighten up in the bed patient is resting with call bell in reach

## 2021-08-29 NOTE — Evaluation (Signed)
Physical Therapy Evaluation Patient Details Name: Amanda Hendricks MRN: [id number] DOB: 06-Jun-1966 Today's Date: 08/29/2021  History of Present Illness  Pt is a 56 y/o female presenting with nausea/vomiting, abdominal pain.  Noted recent admission from 1/8-1/11  for a similar issue. Found with community acquired pneumonia, possible aspiration.  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  Clinical Impression  Pt admitted secondary to problem above with deficits below. Pt requiring min to min guard A for transfers and gait this session. Reporting increased nausea at end of session which limited further mobility. Recommending HHPT at d/c as pt reports she has not gotten any follow up. If pt does not qualify for HHPT, would benefit from outpatient follow up. Will continue to follow acutely.        Recommendations for follow up therapy are one component of a multi-disciplinary discharge planning process, led by the attending physician.  Recommendations may be updated based on patient status, additional functional criteria and insurance authorization.  Follow Up Recommendations Home health PT (if she cannot get HHPT, would benefit from outpatient PT)    Assistance Recommended at Discharge Intermittent Supervision/Assistance  Patient can return home with the following  Help with stairs or ramp for entrance;Assist for transportation    Equipment Recommendations None recommended by PT  Recommendations for Other Services       Functional Status Assessment Patient has had a recent decline in their functional status and demonstrates the ability to make significant improvements in function in a reasonable and predictable amount of time.     Precautions / Restrictions Precautions Precautions: Fall Restrictions Weight Bearing Restrictions: No      Mobility  Bed Mobility Overal bed mobility: Needs Assistance Bed  Mobility: Sit to Supine       Sit to supine: Min assist   General bed mobility comments: Min A for LLE assist.    Transfers Overall transfer level: Needs assistance Equipment used: Rolling walker (2 wheels) Transfers: Sit to/from Stand Sit to Stand: Min assist           General transfer comment: to power up and steady    Ambulation/Gait Ambulation/Gait assistance: Min guard Gait Distance (Feet): 120 Feet Assistive device: Rolling walker (2 wheels) Gait Pattern/deviations: Step-through pattern, Decreased stance time - left, Decreased weight shift to left, Trunk flexed Gait velocity: decreased     General Gait Details: Slow, cautious gait. Min guard for safety. Cues for sequencing and upright posture. Required standing rest X 1  Stairs            Wheelchair Mobility    Modified Rankin (Stroke Patients Only)       Balance Overall balance assessment: Needs assistance Sitting-balance support: No upper extremity supported, Feet supported Sitting balance-Leahy Scale: Good     Standing balance support: During functional activity, No upper extremity supported, Bilateral upper extremity supported Standing balance-Leahy Scale: Fair Standing balance comment: relies on BUE support dynamically. Can stand at sink without UE support                             Pertinent Vitals/Pain Pain Assessment Pain Assessment: Faces Faces Pain Scale: Hurts even more Pain Location: L hip, back Pain Descriptors / Indicators: Discomfort, Grimacing, Guarding, Aching Pain Intervention(s): Limited activity within patient's tolerance, Monitored during session, Repositioned    Home Living Family/patient expects to be discharged to:: Private residence Living Arrangements: Parent (  Mom) Available Help at Discharge: Family Type of Home: House Home Access: Ramped entrance       Home Layout: One level Home Equipment: Conservation officer, nature (2 wheels) Additional Comments: son  staying at home to help as long as needed (but works during the day)    Prior Function Prior Level of Function : Independent/Modified Independent             Mobility Comments: using RW ADLs Comments: reports independent     Hand Dominance   Dominant Hand: Right    Extremity/Trunk Assessment   Upper Extremity Assessment Upper Extremity Assessment: Defer to OT evaluation    Lower Extremity Assessment Lower Extremity Assessment: LLE deficits/detail LLE Deficits / Details: Recent L THA. Continues to be limited secondary to pain.    Cervical / Trunk Assessment Cervical / Trunk Assessment: Kyphotic  Communication   Communication: No difficulties  Cognition Arousal/Alertness: Awake/alert Behavior During Therapy: WFL for tasks assessed/performed Overall Cognitive Status: Within Functional Limits for tasks assessed                                          General Comments General comments (skin integrity, edema, etc.): pt on 2L, HR up to 124    Exercises     Assessment/Plan    PT Assessment Patient needs continued PT services  PT Problem List Decreased strength;Decreased activity tolerance;Decreased balance;Decreased mobility;Pain       PT Treatment Interventions DME instruction;Gait training;Functional mobility training;Therapeutic activities;Therapeutic exercise;Balance training;Patient/family education    PT Goals (Current goals can be found in the Care Plan section)  Acute Rehab PT Goals Patient Stated Goal: get stronger PT Goal Formulation: With patient Time For Goal Achievement: 09/12/21 Potential to Achieve Goals: Good    Frequency Min 3X/week     Co-evaluation               AM-PAC PT "6 Clicks" Mobility  Outcome Measure Help needed turning from your back to your side while in a flat bed without using bedrails?: None Help needed moving from lying on your back to sitting on the side of a flat bed without using bedrails?: A  Little Help needed moving to and from a bed to a chair (including a wheelchair)?: A Little Help needed standing up from a chair using your arms (e.g., wheelchair or bedside chair)?: A Little Help needed to walk in hospital room?: A Little Help needed climbing 3-5 steps with a railing? : A Little 6 Click Score: 19    End of Session Equipment Utilized During Treatment: Gait belt Activity Tolerance: Patient tolerated treatment well Patient left: in bed;with call bell/phone within reach Nurse Communication: Mobility status;Other (comment) (pt requesting nausea meds) PT Visit Diagnosis: Unsteadiness on feet (R26.81);Other abnormalities of gait and mobility (R26.89);Muscle weakness (generalized) (M62.81);Difficulty in walking, not elsewhere classified (R26.2);Pain Pain - Right/Left: Left Pain - part of body: Hip    Time: 6433-2951 PT Time Calculation (min) (ACUTE ONLY): 12 min   Charges:   PT Evaluation $PT Eval Low Complexity: 1 Low          Lou Miner, DPT  Acute Rehabilitation Services  Pager: [phone] Office: [phone]   Rudean Hitt 08/29/2021, 1:47 PM

## 2021-08-29 NOTE — Evaluation (Addendum)
Occupational Therapy Evaluation Patient Details Name: Amanda Hendricks MRN: [id number] DOB: 25-Jul-1966 Today's Date: 08/29/2021   History of Present Illness Pt is a 56 y/o female presenting with nausea/vomiting, abdominal pain.  Noted recent admission from 1/8-1/11  for a similar issue. Found with community acquired pneumonia, possible aspiration.  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   Clinical Impression   Patient admitted for above and limited by problem list below, including pain, generalized weakness, decreased activity tolerance, impaired balance.  Patient currently requires min assist for transfers using RW, min assist to min guard for mobility in room and up to min assist for ADLs.  Denies nausea during session, on 2L with HR up to 124 BPM and no signs of shortness of breath.  Patient reports her mom and son will be able to assist at dc as needed.  Believe she will benefit from continued OT services while admitted and after dc at [facility] level to optimize independence and return to PLOF. Will follow acutely.      Recommendations for follow up therapy are one component of a multi-disciplinary discharge planning process, led by the attending physician.  Recommendations may be updated based on patient status, additional functional criteria and insurance authorization.   Follow Up Recommendations  Home health OT    Assistance Recommended at Discharge Set up Supervision/Assistance  Patient can return home with the following A little help with bathing/dressing/bathroom;Assistance with cooking/housework;Assist for transportation;A little help with walking and/or transfers    Functional Status Assessment  Patient has had a recent decline in their functional status and demonstrates the ability to make significant improvements in function in a reasonable and predictable amount of time.  Equipment Recommendations   BSC/3in1    Recommendations for Other Services       Precautions / Restrictions Precautions Precautions: Fall Restrictions Weight Bearing Restrictions: No      Mobility Bed Mobility Overal bed mobility: Needs Assistance Bed Mobility: Supine to Sit     Supine to sit: Supervision     General bed mobility comments: increased time, supervision for line mgmt    Transfers Overall transfer level: Needs assistance Equipment used: Rolling walker (2 wheels) Transfers: Sit to/from Stand Sit to Stand: Min assist           General transfer comment: to power up and steady      Balance Overall balance assessment: Needs assistance Sitting-balance support: No upper extremity supported, Feet supported Sitting balance-Leahy Scale: Good     Standing balance support: During functional activity, No upper extremity supported, Bilateral upper extremity supported Standing balance-Leahy Scale: Fair Standing balance comment: relies on BUE support dynamically                           ADL either performed or assessed with clinical judgement   ADL Overall ADL's : Needs assistance/impaired     Grooming: Supervision/safety;Standing           Upper Body Dressing : Set up;Sitting   Lower Body Dressing: Minimal assistance;Sit to/from stand   Toilet Transfer: Minimal assistance;Ambulation;Rolling walker (2 wheels)           Functional mobility during ADLs: Minimal assistance;Rolling walker (2 wheels)       Vision Baseline Vision/History: 1 Wears glasses Ability to See in Adequate Light: 0 Adequate Patient Visual Report: No change from baseline       Perception  Praxis      Pertinent Vitals/Pain Pain Assessment Pain Assessment: Faces Pain Score: 7  Pain Location: L hip, back     Hand Dominance Right   Extremity/Trunk Assessment Upper Extremity Assessment Upper Extremity Assessment: Overall WFL for tasks assessed   Lower Extremity  Assessment Lower Extremity Assessment: Defer to PT evaluation   Cervical / Trunk Assessment Cervical / Trunk Assessment: Kyphotic   Communication Communication Communication: No difficulties   Cognition Arousal/Alertness: Awake/alert Behavior During Therapy: WFL for tasks assessed/performed Overall Cognitive Status: Within Functional Limits for tasks assessed                                       General Comments  pt on 2L, HR up to 124    Exercises     Shoulder Instructions      Home Living Family/patient expects to be discharged to:: Private residence Living Arrangements: Parent (Mom) Available Help at Discharge: Family Type of Home: House Home Access: Ramped entrance     Home Layout: One level     Bathroom Shower/Tub: Teacher, early years/pre: Standard     Home Equipment: Conservation officer, nature (2 wheels)   Additional Comments: son staying at home to help as long as needed (but works during the day)      Prior Functioning/Environment Prior Level of Function : Independent/Modified Independent             Mobility Comments: using RW ADLs Comments: reports independent        OT Problem List: Decreased strength;Decreased activity tolerance;Impaired balance (sitting and/or standing);Pain;Decreased knowledge of precautions;Decreased knowledge of use of DME or AE      OT Treatment/Interventions: Self-care/ADL training;Therapeutic exercise;DME and/or AE instruction;Therapeutic activities;Balance training;Patient/family education    OT Goals(Current goals can be found in the care plan section) Acute Rehab OT Goals Patient Stated Goal: home OT Goal Formulation: With patient Time For Goal Achievement: 09/12/21 Potential to Achieve Goals: Good  OT Frequency: Min 2X/week    Co-evaluation              AM-PAC OT "6 Clicks" Daily Activity     Outcome Measure Help from another person eating meals?: None Help from another person taking  care of personal grooming?: A Little Help from another person toileting, which includes using toliet, bedpan, or urinal?: A Little Help from another person bathing (including washing, rinsing, drying)?: A Little Help from another person to put on and taking off regular upper body clothing?: None Help from another person to put on and taking off regular lower body clothing?: A Little 6 Click Score: 20   End of Session Equipment Utilized During Treatment: Rolling walker (2 wheels);Oxygen (2L) Nurse Communication: Mobility status  Activity Tolerance: Patient tolerated treatment well Patient left: Other (comment) (with PT)  OT Visit Diagnosis: Other abnormalities of gait and mobility (R26.89);Muscle weakness (generalized) (M62.81) Pain - Right/Left: Left Pain - part of body: Hip (back)                Time: 0981-1914 OT Time Calculation (min): 18 min Charges:  OT General Charges $OT Visit: 1 Visit OT Evaluation $OT Eval Moderate Complexity: 1 Mod  Jolaine Artist, OT Acute Rehabilitation Services Pager [phone] Office [phone]   Delight Stare 08/29/2021, 1:36 PM

## 2021-08-29 NOTE — Progress Notes (Signed)
PHARMACY - TOTAL PARENTERAL NUTRITION CONSULT NOTE  Indication:  Intestinal stricture  Patient Measurements: Height: 5' 7"  (170.2 cm) Weight: 54.6 kg (120 lb 5.9 oz) IBW/kg (Calculated) : 61.6 TPN AdjBW (KG): 54.6 Body mass index is 18.85 kg/m.  Assessment:  79 YOF with hx Roux-en-Y gastric bypass in 2015, cholecystectomy, Cushing disease, hypothyroidism s/p R thyroid lobectomy in 2018.  She was discharged from [facility] on 1/5 after a 2 month admit s/p L hip repair. Her hospitalization was complicated by a severe intestinal stenosis at the [place] junction along with multiple severe marginal ulcerations that caused recurring GI bleeding. She was instructed to be NPO and was receiving TPN upon discharge. With improved nutrition, plan is for surgical intervention to address the segment of small bowel and the stenosis and possible reversal of Roux-en-Y.   She was discharged from [facility] on 08/23/21 after deeming that symptoms were residual from her chronic conditions.  Patient returned on 1/15 with worsening N/V.  Pharmacy consulted for TPN management.  She reports being on a 20-hr cycle TPN and only missed 1/15's dose as she was in the ED.  1/17: initial labs drawn were drawn from PICC with TPN infusing, repeat labs are more accurate  Glucose / Insulin: no hx DM - CBGs low normal (hypoglycemic x 1 on 1/16), no insulin used in last 24h Electrolytes: Na/CL low 130/95, K 3.3 s/p 4 runs, Mag 1.4 s/p 4g, Phos down 3 Renal: SCr < 1, BUN WNL Hepatic: LFTs / Tbili WNL, TG up 170, Alk phos elevated (thought d/t TPN per GI), prealbumin WNL on 1/1, albumin 1.9 Intake / Output; MIVF: UOP 1 ml/kg/hr GI Imaging: 1/8 RUQ US/CT: scan shows some biliary ductal dilation post cholecystectomy  1/8 MRI/MRCP: intra and extrahepatic biliary ductal dilation, no choledocholithiasis GI Surgeries / Procedures: N/A  Central access: double lumen PICC 06/30/21 TPN start date: chronic, 08/28/20 this admit  Home TPN provided by  [facility]: 1600 ml over 20 hr = 80 ml/hr (provides 82g A, 40g SMOFlipid 20%, 265g CHO, 1627 kCal Electrolytes/bag: Na 28 mEq, K 62mq, no Ca, Mg 322m, Phos 2.65m67m, max Cl (330 meq) Additives: 10 ml MVI, 1 ml trace elements  Nutritional Goals: RD Assessment pending   1800-2000 kCal, 90-100g protein per day  Current Nutrition:  NPO  Plan:  Restart TPN as a continuous infusion given possible refeeding.  Cycle once stable. Increase TPN to 50 ml/hr at 1800 (goal rate 80 ml/hr) Electrolytes in TPN: Na 100m74m, K 50mE40m Ca 65mEq/79mMg 65mEq/L38mhos 165mmol/22ml:Ac 1:1 - lytes will increase with increase in rate Add standard MVI and trace elements to TPN Start sensitive SSI Q6H Standard TPN labs and in am  KCl 10 meq IV q1h x4 Mg 4 g IV x1 KPhos 10 mmol IV x1  Thank you for involving pharmacy in this patient's care.  JenniferRenold Genta, BCPS Clinical Pharmacist Clinical phone for 08/29/2021 until 3p is [phone] 7:23 AM  **Pharmacist phone directory can be found on [email or web address]

## 2021-08-29 NOTE — ED Notes (Signed)
Messaged pharmacy x2 30 min apart concerning my 0200 dose of abx that is not stocked on the floor. Still have not received the medication.

## 2021-08-29 NOTE — Hospital Course (Addendum)
HPI per Dr. 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 She was recently admitted to our service 1/8-1/11 for a similar issue. After discharge patient reports she felt well for the first 24 hours at home. After this, she tried two tablespoons of broth. Amanda Hendricks subsequently had nausea and vomiting which have persisted through today. Mentions she has not been able to take any medicine during this time. Otherwise reports having increased productive cough with white mucous. She denies fevers, chills, chest pain, dyspnea. "  #Aspiration pneumonia Initial imaging revealed possible pneumonia during previous admission. Patient had increase in cough during her last hospitalization 1-2d prior to discharge, we initiated oral antibiotics. Unfortunately, she was unable to tolerate any PO intake. Patient reported increase in productive cough after leaving the hospital. No fever or dyspnea over the last few days. She endorsed significant deconditioned with multiple episodes of nausea and vomiting. Given her chest x-ray findings, possible aspiration pneumonia was suspected. She was initially started on ceftriaxone and azithromycin, but was transitioned to IV Unasyn for anaerobic coverage. Blood cultures were NGTD. Patient was transitioned to liquid augmentin.    #Chest pain (resolved) After initial evaluation for Amanda Hendricks, had chest pressure and shortness of breath with rw/ SpO2 69%. 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 ASA and sublingual nitroglycerin were given for symptomatic relief  and troponin were ordered which was negative. This was monitored during her hospitalization and did not recur.    #Nausea, vomiting #History of Roux-en-Y  gastric bypass #Intestinal stenosis Patient presenting with similar presentation regarding abdominal pain, nausea, and vomiting. This issue is chronic for the patient and is being managed by GI at [facility]. During her last hospitalization GI saw this patient, recommended follow-up at [facility] for revision of Roux-en-Y. We held off consulting given her primary care is with [facility]. Her previous medications were continued during this hospitalization. Including Misoprostol 256mg four times daily, Pantoprazole 459mtwice daily, Sucralfate 1g three times daily before meals. Patient abdominal, nausea, and vomiting that was exacerbated by eating or drinking water. She was kept NPO other than her meds and she received TPN.    #Protein-calorie malnutrition Patient was severely malnourished with recent prolonged hospital stay and overall deconditioning. She required TPN prior to admission and that was continued during her hospitalization.    #Hypomagnesemia  #Hypokalemia Both of these were monitored and repeated as needed.    #Recent L hip fracture Patient reports continued L hip pain. We continued with her home regimen, and had IV breakthrough as needed. Including Oxycodone IR 67m63mvery 4 hours, home duloxetine 69m767mily, IV dilaudid 1 mg every 4 hours as needed for breakthrough   #Hypertension She was normotensive during her hospital stay and her home meds were held during this admission.    #Hypothyroidism Home Synthroid 100mc31mily was continued.    #Bipolar 1 disorder Home Seroquel 300mg 106my was continued.

## 2021-08-29 NOTE — ED Notes (Signed)
Rounding team at bedside

## 2021-08-29 NOTE — Progress Notes (Signed)
Amanda Hendricks is 56yo person with previous Roux-en-Y gastric bypass now complicated with intestinal stenosis, recent left hip fracture, osteoporosis, hypothyroidism admitted 1/16 with community acquired pneumonia.   Overnight:NAEON  Subjective: Patient stated she had an episode earlier when she had difficulty breathing. Stated she was having abdominal pain earlier as well. Denied any other acute concerns.   Objective:  Vital signs in last 24 hours: Afebrile, HR 70-110s, BP normotensive since midnight with hypertensive yesterday at 140s/100.  Vitals:   08/29/21 0130 08/29/21 0345 08/29/21 0430 08/29/21 0730  BP: 119/71 130/72 118/73 123/75  Pulse: (!) 106 (!) 111 (!) 101 (!) 110  Resp: 17 17 (!) 8 20  Temp:      TempSrc:      SpO2: 95% 97% 97% 95%  Weight:      Height:         Filed Weights   08/28/21 1000  Weight: 54.6 kg    Intake/Output Summary (Last 24 hours) at 08/29/2021 0756 Last data filed at 08/29/2021 0404 Gross per 24 hour  Intake 701.59 ml  Output 1250 ml  Net -548.41 ml   Net IO Since Admission: -548.41 mL [08/29/21 0756]  Supplemental O2: Nasal Cannula SpO2: 95 % O2 Flow Rate (L/min): 2L/min Physical Exam General: NAD  Mouth and Throat: Lips normal color, without lesions. Neck: Neck supple with full range of motion (ROM).  Lungs: decreased air movement bilaterally, rhonchi present but no rales or wheezes.  Cardiovascular: tachycardia, sinus rhythm, no r/m/g, 2+ radial pulses  Abdomen: TTP in epigastric region, hypoactive bowel sounds Neuro: Alert and oriented. CN grossly intact Psych: Normal mood and normal affect     Labs: Glucose elevated but patient given D50 yesterday for hypoglycemia. A1c from 08/23/21 was 4.5. CBC Latest Ref Rng & Units 08/27/2021 08/23/2021 08/22/2021  WBC 4.0 - 10.5 K/uL 7.7 3.6(L) 3.9(L)  Hemoglobin 12.0 - 15.0 g/dL 9.3(L) 7.9(L) 9.3(L)  Hematocrit 36.0 - 46.0 % 30.5(L) 25.2(L) 29.7(L)  Platelets 150 - 400 K/uL 353 201 211     CMP Latest Ref Rng & Units 08/29/2021 08/28/2021 08/28/2021  Glucose 70 - 99 mg/dL 362(H) 301(H) 113(H)  BUN 6 - 20 mg/dL 6 8 8   Creatinine 0.44 - 1.00 mg/dL 0.47 0.40(L) 0.44  Sodium 135 - 145 mmol/L 131(L) 128(L) 133(L)  Potassium 3.5 - 5.1 mmol/L 4.6 4.7 2.7(LL)  Chloride 98 - 111 mmol/L 94(L) 93(L) 95(L)  CO2 22 - 32 mmol/L 31 29 26   Calcium 8.9 - 10.3 mg/dL 8.1(L) 8.3(L) 8.6(L)  Total Protein 6.5 - 8.1 g/dL 4.5(L) - -  Total Bilirubin 0.3 - 1.2 mg/dL 0.3 - -  Alkaline Phos 38 - 126 U/L 396(H) - -  AST 15 - 41 U/L 46(H) - -  ALT 0 - 44 U/L 28 - -       Assessment/Plan: Amanda Hendricks is 56yo person with previous Roux-en-Y gastric bypass now complicated with intestinal stenosis, recent left hip fracture, osteoporosis, hypothyroidism admitted 1/16 with imaging CXR showing right lower lobe infiltrate consistient with community acquired pneumonia.   Principal Problem:   Aspiration pneumonia ([place]) Active Problems:   History of Roux-en-Y gastric bypass   Chronic bronchitis with emphysema (HCC)   HTN (hypertension)   Protein-calorie malnutrition ([place])   Recent fracture of hip ([place])  #Aspiration pneumonia Patient still having cough and night sweats. Afebrile since admission and no leukocytosis. The cough is dry but feels like mucus is present in the lungs. Currently patient on IV Unasyn. Her oxygen  requirement improved from 8 L earlier to 2 L in the afternoon.  - Start IV Unasyn 3 g q 6hrs - Follow-up blood cultures; NGTD - Aspiration precautions - Daily CBC   #Nausea, vomiting #History of Roux-en-Y gastric bypass #Intestinal stenosis During her last hospitalization GI saw this patient, recommended follow-up at [facility] for revision of Roux-en-Y. We will hold off consulting given her primary care is with [facility]. Patient continues to sip water and juice, which may be exacerbating her symptoms of n/v and abdominal pain. Since she is is receiving TPN, we will do sips with oral meds  otherwise limit her oral intake until improvement and she is able to follow up with her [facility].  - TPN per pharmacy, currently at 50 cc/hr. Ice chips and sips with meds, otherwise NPO.  - Misoprostol 244mg four times daily - Pantoprazole 492mtwice daily - Sucralfate 1g three times daily before meals - Will need follow-up with [facility]   #Protein-calorie malnutrition Patient is severely malnourished with recent prolonged hospital stay and overall deconditioning. She is currently requiring TPN given she cannot take much by mouth. We will have our pharmacy and RD consulted for assistance with this. - TPN per RD, pharmacy   #Hypomagnesemia  #Hypokalemia Repeat labs this morning revealed Mg 1.7, K 4.6. We will replace these as needed. Lab work later in the morning showed Mg 1.4 and K of 3.3. Patient receiving 4 g IV mag and IV potassium supplementation.  - Daily BMP, Mg - CTM and replete as needed  #Recent L hip fracture Patient reports continued L hip pain. We will continue with her home regimen, have IV breakthrough as needed. - Oxycodone IR 18m51mvery 4 hours - Continue home duloxetine 23m418mily - IV dilaudid 1 mg every 4 hours as needed for breakthrough - Hold & Call MD if SBP<90, HR<65, RR<10, O2<90, or altered mental status.   #Hypertension Chronic. Currently normotensive.  - Hold home bp meds    #Hypothyroidism - Continue home Synthroid 100mc17mily   #Bipolar 1 disorder - Continue home Seroquel 300mg 27my  DIET: TPN IVF: LR DVT PPX: lovenox BOWEL: Senokot-S CODE: FULL FAM COM: n/a   DISPO: Admit patient to Observation with expected length of stay less than 2 midnights.  GhalibIdamae Schulleroses Tillie Rung MOrthopaedic Specialty Surgery Centernal Medicine Residency, PGY-1  Pager: [phone] 5 pm and on weekends: Please call the on-call pager

## 2021-08-30 ENCOUNTER — Other Ambulatory Visit (HOSPITAL_COMMUNITY): Payer: Self-pay

## 2021-08-30 DIAGNOSIS — Z9884 Bariatric surgery status: Secondary | ICD-10-CM

## 2021-08-30 LAB — GLUCOSE, CAPILLARY
Glucose-Capillary: 108 mg/dL — ABNORMAL HIGH (ref 70–99)
Glucose-Capillary: 119 mg/dL — ABNORMAL HIGH (ref 70–99)

## 2021-08-30 LAB — COMPREHENSIVE METABOLIC PANEL
ALT: 24 U/L (ref 0–44)
AST: 29 U/L (ref 15–41)
Albumin: 1.9 g/dL — ABNORMAL LOW (ref 3.5–5.0)
Alkaline Phosphatase: 363 U/L — ABNORMAL HIGH (ref 38–126)
Anion gap: 5 (ref 5–15)
BUN: <5 mg/dL — ABNORMAL LOW (ref 6–20)
CO2: 34 mmol/L — ABNORMAL HIGH (ref 22–32)
Calcium: 8.4 mg/dL — ABNORMAL LOW (ref 8.9–10.3)
Chloride: 93 mmol/L — ABNORMAL LOW (ref 98–111)
Creatinine, Ser: 0.44 mg/dL (ref 0.44–1.00)
GFR, Estimated: >60 mL/min (ref >60)
Glucose, Bld: 100 mg/dL — ABNORMAL HIGH (ref 70–99)
Potassium: 4.1 mmol/L (ref 3.5–5.1)
Sodium: 132 mmol/L — ABNORMAL LOW (ref 135–145)
Total Bilirubin: 0.2 mg/dL — ABNORMAL LOW (ref 0.3–1.2)
Total Protein: 5 g/dL — ABNORMAL LOW (ref 6.5–8.1)

## 2021-08-30 LAB — MAGNESIUM: Magnesium: 1.6 mg/dL — ABNORMAL LOW (ref 1.7–2.4)

## 2021-08-30 LAB — PHOSPHORUS: Phosphorus: 3.8 mg/dL (ref 2.5–4.6)

## 2021-08-30 LAB — TRIGLYCERIDES: Triglycerides: 139 mg/dL (ref <150)

## 2021-08-30 MED ORDER — AMOXICILLIN-POT CLAVULANATE 400-57 MG/5ML PO SUSR
800.0000 mg | Freq: Two times a day (BID) | ORAL | 0 refills | Status: AC
  Filled 2021-08-30: qty 100, 5d supply, fill #0

## 2021-08-30 MED ORDER — MAGNESIUM SULFATE 2 GM/50ML IV SOLN
2.0000 g | Freq: Once | INTRAVENOUS | Status: AC
  Administered 2021-08-30: 2 g via INTRAVENOUS
  Filled 2021-08-30: qty 50

## 2021-08-30 MED ORDER — AMOXICILLIN-POT CLAVULANATE 400-57 MG/5ML PO SUSR
800.0000 mg | Freq: Two times a day (BID) | ORAL | Status: DC
  Administered 2021-08-30: 800 mg via ORAL
  Filled 2021-08-30 (×2): qty 10

## 2021-08-30 MED ORDER — ZINC CHLORIDE 1 MG/ML IV SOLN
INTRAVENOUS | Status: DC
  Filled 2021-08-30: qty 960

## 2021-08-30 MED ORDER — GUAIFENESIN 100 MG/5ML PO LIQD
5.0000 mL | ORAL | 0 refills | Status: AC | PRN
  Filled 2021-08-30: qty 118, 4d supply, fill #0

## 2021-08-30 MED ORDER — VITAMIN A 3 MG (10000 UNIT) PO CAPS
10000.0000 [IU] | ORAL_CAPSULE | Freq: Every day | ORAL | Status: DC
  Administered 2021-08-30: 10000 [IU] via ORAL
  Filled 2021-08-30: qty 1

## 2021-08-30 MED ORDER — VITAMIN A 3 MG (10000 UNIT) PO CAPS
10000.0000 [IU] | ORAL_CAPSULE | Freq: Every day | ORAL | 0 refills | Status: AC
  Filled 2021-08-30: qty 7, 7d supply, fill #0

## 2021-08-30 NOTE — Progress Notes (Signed)
PHARMACY - TOTAL PARENTERAL NUTRITION CONSULT NOTE  Indication:  Intestinal stricture  Patient Measurements: Height: 5' 7"  (170.2 cm) Weight: 54.6 kg (120 lb 5.9 oz) IBW/kg (Calculated) : 61.6 TPN AdjBW (KG): 54.6 Body mass index is 18.85 kg/m.  Assessment:  53 YOF with hx Roux-en-Y gastric bypass in 2015, cholecystectomy, Cushing disease, hypothyroidism s/p R thyroid lobectomy in 2018.  She was discharged from [facility] on 1/5 after a 2 month admit s/p L hip repair. Her hospitalization was complicated by a severe intestinal stenosis at the [place] junction along with multiple severe marginal ulcerations that caused recurring GI bleeding. She was instructed to be NPO and was receiving TPN upon discharge. With improved nutrition, plan is for surgical intervention to address the segment of small bowel and the stenosis and possible reversal of Roux-en-Y.   She was discharged from [facility] on 08/23/21 after deeming that symptoms were residual from her chronic conditions.  Patient returned on 1/15 with worsening N/V.  Pharmacy consulted for TPN management.  She reports being on a 20-hr cycle TPN and only missed 1/15's dose as she was in the ED.  1/17: initial labs drawn were drawn from PICC with TPN infusing, repeat labs are more accurate  Glucose / Insulin: no hx DM - CBGs low normal (hypoglycemic x 1 on 1/16), no insulin used in last 24h Electrolytes: Na/CL low 132/93, K 4.1 s/p 4 runs + KPhos, Mag 1.6 s/p 4g, Phos up 3.8, CoCa 10.1 Renal: SCr < 1, BUN WNL Hepatic: LFTs / Tbili WNL, TG down 139, Alk phos elevated (thought d/t TPN per GI), prealbumin WNL on 1/1, albumin 1.9 Intake / Output; MIVF: UOP not charted GI Imaging: 1/8 RUQ US/CT: scan shows some biliary ductal dilation post cholecystectomy  1/8 MRI/MRCP: intra and extrahepatic biliary ductal dilation, no choledocholithiasis GI Surgeries / Procedures: N/A  Central access: double lumen PICC 06/30/21 TPN start date: chronic, 08/28/20 this  admit  Home TPN provided by [facility]: 1600 ml over 20 hr = 80 ml/hr (provides 82g A, 40g SMOFlipid 20%, 265g CHO, 1627 kCal Electrolytes/bag: Na 28 mEq, K 425mq, no Ca, Mg 383m, Phos 2.25m69m, max Cl (330 meq) Additives: 10 ml MVI, 1 ml trace elements  Nutritional Goals: RD Assessment Estimated Needs Total Energy Estimated Needs: 1900-2100 Total Protein Estimated Needs: 95-105 grams Total Fluid Estimated Needs: >1.9L  Current Nutrition:  NPO  Plan:  Adjusted TPN to meet new RD estimated needs on 1/18 Restarted TPN as a continuous infusion given refeeding.  Cycle once stable. Increase TPN to goal rate 80 ml/hr at 1800 (provides 96g protein and 1966 kcal meeting 100% needs) Electrolytes in TPN: Na 100m59m, decrease K 40mE86m decrease Ca 2mEq/18mincrease Mg to 8mEq/L33mhos 125mmol/62mhange to Cl:Ac 2:1  Add standard MVI, trace elements, 100 mg thiamine to TPN Add additional 2 mg zinc (5 mg total), 40 mcg selenium (100 mcg total) to TPN Begin vitamin A 10,000 mcg PO daily - ok per MD Continue sensitive SSI Q6H  Standard TPN labs Mon/Thurs and in am Consider rechecking vitamin A, zinc, and selenium levels in 2 weeks  Mg 2 g IV x1  Thank you for involving pharmacy in this patient's care.  JenniferRenold Genta, BCPS Clinical Pharmacist Clinical phone for 08/30/2021 until 3p is [phone] 7:21 AM  **Pharmacist phone directory can be found on [email or web address]

## 2021-08-30 NOTE — TOC Transition Note (Signed)
Transition of Care [facility]) - CM/SW Discharge Note   Patient Details  Name: Amanda Hendricks MRN: [id number] Date of Birth: 01-30-1966  Transition of Care [facility]) CM/SW Contact:  Sharin Mons, RN Phone Number: 08/30/2021, 12:19 PM   Clinical Narrative:    Patient will DC to: home  Anticipated DC date: 08/30/2021 Family notified: yes Transport by: car  Per MD patient ready for DC today . RN, patient, patient's mom, and Vida Roller / [facility] notified of DC.  Resumption orders noted for [facility] and TPN. Order faxed to [phone]. Pt with home oxygen, provider [employer]. NCM called Ashley/[employer] for portable oxygen tank for transport to home. Caryl Pina to bring portable tank to bedside prior to d/c.  Post hospital f/u noted on AVS.  Pt without Rx MED concerns.  Mother to provide transportation to home.     RNCM will sign off for now as intervention is no longer needed. Please consult [place] again if new needs arise.    Final next level of care: [place] Barriers to Discharge: No Barriers Identified   Patient Goals and CMS Choice     Choice offered to / list presented to : Patient  Discharge Placement                       Discharge Plan and Services                DME Arranged: Oxygen (portable tank for transport home) DME Agency: [facility] Date DME Agency Contacted: 08/30/21 Time DME Agency Contacted: 1216 Representative spoke with at DME Agency: Caryl Pina HH Arranged: RN, TPN [place] Agency: Other - See comment ([facility]) Date [place]: 08/30/21 Time [place]: 1218 Representative spoke with at [place]: [place] Determinants of Health ([place]) Interventions     Readmission Risk Interventions No flowsheet data found.

## 2021-08-30 NOTE — Discharge Instructions (Addendum)
It was a pleasure taking care of you!  You presented to the hospital with abdominal pain, nausea, vomiting and shortness of breath. It appears your symptoms were coming from eating and drinking which has exacerbated your pain, nausea and vomiting. Since your getting nutrition through the TPN, we advise you to limit your oral intake to water when taking medications and taking medications that are liquid. The shortness of breath and coughing were exacerbating your abdominal pain which can be coming from your pneumonia. Unfortunately, you were unable to take the medications during the last discharge, so we are making it a liquid antibiotics so you can tolerate it better. You stated you had oxygen at home, so continue using that until your follow up with your primary care physician.   We would like you to resume TPN at home, and limit oral intake as mentioned above until you are able to follow up with [facility] later this month. Hopefully they can fix the underlying issue and you can resume oral intake after that. Please finish the antibiotics.   I hope you get better soon!

## 2021-08-30 NOTE — Progress Notes (Signed)
Physical Therapy Treatment Patient Details Name: Amanda Hendricks MRN: [id number] DOB: Dec 13, 1965 Today's Date: 08/30/2021   History of Present Illness Pt is a 56 y/o female presenting with nausea/vomiting, abdominal pain.  Noted recent admission from 1/8-1/11  for a similar issue. Found with community acquired pneumonia, possible aspiration.  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    PT Comments    Received pt slouched in bed. Pt agreeable to PT treatment but requesting pain meds - RN notified. Session with emphasis on functional mobility, gait training, and toileting. Pt performed bed mobility mod I using bed features and performed all transfers with RW and min guard. Pt ambulated into hallway with RW and min guard but limited by increased L hip pain and requested to return to bed. O2 sat >90% on 2L but when taken off O2, sats dropped to 85% at rest - pt will need supplemental O2 at home. Pt requested to use bedpan to void due to high pain levels and bridged hips while therapist placed bedpan. Provided min A to don clean diaper and pt left with all needs within reach awaiting ride to D/C home. Acute PT to cont to follow.     Recommendations for follow up therapy are one component of a multi-disciplinary discharge planning process, led by the attending physician.  Recommendations may be updated based on patient status, additional functional criteria and insurance authorization.  Follow Up Recommendations  Home health PT     Assistance Recommended at Discharge Intermittent Supervision/Assistance  Patient can return home with the following Help with stairs or ramp for entrance;Assist for transportation;A little help with walking and/or transfers;A little help with bathing/dressing/bathroom   Equipment Recommendations  Rolling walker (2 wheels)    Recommendations for Other Services       Precautions /  Restrictions Precautions Precautions: Fall Restrictions Weight Bearing Restrictions: No     Mobility  Bed Mobility Overal bed mobility: Modified Independent Bed Mobility: Supine to Sit, Sit to Supine, Rolling Rolling: Modified independent (Device/Increase time)   Supine to sit: Modified independent (Device/Increase time) Sit to supine: Modified independent (Device/Increase time)   General bed mobility comments: HOB elevated and use of bedrails. Relies on UE support to move LLE into bed Patient Response: Cooperative  Transfers Overall transfer level: Needs assistance Equipment used: Rolling walker (2 wheels) Transfers: Sit to/from Stand Sit to Stand: Min guard           General transfer comment: stood from EOB with RW and min guard.    Ambulation/Gait Ambulation/Gait assistance: Min guard Gait Distance (Feet): 40 Feet Assistive device: Rolling walker (2 wheels) Gait Pattern/deviations: Step-through pattern, Decreased stance time - left, Decreased weight shift to left, Trunk flexed, Decreased step length - right, Decreased step length - left, Decreased stride length, Antalgic, Narrow base of support Gait velocity: decreased Gait velocity interpretation: <1.8 ft/sec, indicate of risk for recurrent falls   General Gait Details: decreased cadence with significant kyphosis - limited by pain   Stairs             Wheelchair Mobility    Modified Rankin (Stroke Patients Only)       Balance Overall balance assessment: Needs assistance Sitting-balance support: No upper extremity supported, Feet supported Sitting balance-Leahy Scale: Good Sitting balance - Comments: extremly kyphotic with posterior pelvic tilt sitting EOB   Standing balance support: Bilateral upper extremity supported (RW) Standing balance-Leahy Scale: Fair Standing balance comment: can maintain  static balance with BUE support and supervision but requires min guard for dynamic balance                             Cognition Arousal/Alertness: Awake/alert Behavior During Therapy: WFL for tasks assessed/performed Overall Cognitive Status: Within Functional Limits for tasks assessed                                          Exercises      General Comments General comments (skin integrity, edema, etc.): pt on 2L with O2 sat >90% during session. Taken off O2 and O2 sat dropped to 85% on RA at rest.      Pertinent Vitals/Pain Pain Assessment Pain Assessment: 0-10 Pain Score: 9  Pain Location: L hip, back Pain Descriptors / Indicators: Discomfort, Guarding, Aching, Dull Pain Intervention(s): Limited activity within patient's tolerance, Monitored during session, Repositioned, RN gave pain meds during session    Home Living                          Prior Function            PT Goals (current goals can now be found in the care plan section) Acute Rehab PT Goals Patient Stated Goal: get stronger PT Goal Formulation: With patient Time For Goal Achievement: 09/12/21 Potential to Achieve Goals: Good Progress towards PT goals: Progressing toward goals    Frequency    Min 3X/week      PT Plan Current plan remains appropriate    Co-evaluation              AM-PAC PT "6 Clicks" Mobility   Outcome Measure  Help needed turning from your back to your side while in a flat bed without using bedrails?: None Help needed moving from lying on your back to sitting on the side of a flat bed without using bedrails?: None Help needed moving to and from a bed to a chair (including a wheelchair)?: A Little Help needed standing up from a chair using your arms (e.g., wheelchair or bedside chair)?: A Little Help needed to walk in hospital room?: A Little Help needed climbing 3-5 steps with a railing? : A Lot 6 Click Score: 19    End of Session Equipment Utilized During Treatment: Gait belt Activity Tolerance: Patient limited by pain Patient  left: in bed;with call bell/phone within reach Nurse Communication: Mobility status PT Visit Diagnosis: Unsteadiness on feet (R26.81);Other abnormalities of gait and mobility (R26.89);Muscle weakness (generalized) (M62.81);Difficulty in walking, not elsewhere classified (R26.2);Pain Pain - Right/Left: Right (and back) Pain - part of body: Hip     Time: 1123-1201 PT Time Calculation (min) (ACUTE ONLY): 38 min  Charges:  $Gait Training: 8-22 mins $Therapeutic Activity: 23-37 mins                     Becky Sax PT, DPT  Blenda Nicely 08/30/2021, 12:11 PM

## 2021-08-30 NOTE — Discharge Summary (Signed)
Name: Amanda Hendricks MRN: [id number] DOB: November 27, 1965 56 y.o. 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[place])   HTN (hypertension)   Protein-calorie malnutrition ([place])   Recent fracture of hip ([place])   Acute respiratory failure with hypoxia [facility])   Discharge Medications: Allergies as of 08/30/2021       Reactions   Lisinopril Cough   Nsaids Other (See Comments)   Bleeding ulcers        Medication List     STOP taking these medications    doxycycline 100 MG tablet Commonly known as: VIBRA-TABS       TAKE these medications    acetaminophen 325 MG tablet Commonly known as: TYLENOL Take 650 mg by mouth every 6 (six) hours as needed for mild pain or headache.   amoxicillin-clavulanate 400-57 MG/5ML suspension Commonly known as: AUGMENTIN Take 10 mLs (800 mg total) by mouth every 12 (twelve) hours for 4 days.   atorvastatin 10 MG tablet Commonly known as: LIPITOR Take 10 mg by mouth at bedtime.   Bariatric Multivitamins/Iron Caps Take 1 tablet by mouth at bedtime.   calcium carbonate 1250 (500 Ca) MG chewable tablet Commonly known as: OS-CAL Chew 500 mg by mouth 3 (three) times daily as needed for heartburn.   divalproex 250 MG DR tablet Commonly known as: DEPAKOTE Take 250 mg by mouth 2 (two) times daily.   DULoxetine 30 MG capsule Commonly known as: CYMBALTA Take 30 mg by mouth daily.   ferrous sulfate 325 (65 FE) MG EC tablet Take 1 tablet by mouth daily with breakfast.   guaiFENesin 100 MG/5ML liquid Commonly known as: ROBITUSSIN Take 5 mLs by mouth every 4 (four) hours as needed for up to 5 days for cough or to loosen phlegm.   lamoTRIgine 200 MG tablet Commonly known as:  LAMICTAL Take 200 mg by mouth daily.   levothyroxine 100 MCG tablet Commonly known as: SYNTHROID Take 100 mcg by mouth daily.   Magnesium Chloride 64 MG Tbec Take 535 mg by mouth in the morning and at bedtime.   methocarbamol 500 MG tablet Commonly known as: ROBAXIN Take 1,000 mg by mouth every 8 (eight) hours as needed for muscle spasms.   metoprolol tartrate 25 MG tablet Commonly known as: LOPRESSOR Take 25 mg by mouth in the morning and at bedtime.   misoprostol 200 MCG tablet Commonly known as: CYTOTEC Take 200 mcg by mouth 4 (four) times daily.   ondansetron 4 MG disintegrating tablet Commonly known as: ZOFRAN-ODT Take 4 mg by mouth every 8 (eight) hours as needed for nausea or vomiting.   oxyCODONE 5 MG immediate release tablet Commonly known as: Oxy IR/ROXICODONE Take 5 mg by mouth every 4 (four) hours as needed for moderate pain.   pantoprazole 40 MG tablet Commonly known as: PROTONIX Take 40 mg by mouth 2 (two) times daily.   polyethylene glycol powder 17 GM/SCOOP powder Commonly known as: GLYCOLAX/MIRALAX Take 17 g by mouth daily as needed for mild constipation.   potassium chloride 10 MEQ tablet Commonly known as: KLOR-CON Take 10 mEq by mouth daily.   pregabalin 75 MG capsule Commonly known as: LYRICA Take 75 mg by mouth daily.   prochlorperazine 5 MG tablet Commonly known as: COMPAZINE  Take 5 mg by mouth every 8 (eight) hours as needed for nausea or vomiting.   promethazine 12.5 MG tablet Commonly known as: PHENERGAN Take 12.5 mg by mouth every 8 (eight) hours as needed for nausea or vomiting.   QUEtiapine 300 MG tablet Commonly known as: SEROQUEL Take 300 mg by mouth at bedtime.   senna-docusate 8.6-50 MG tablet Commonly known as: Senokot-S Take 2 tablets by mouth 2 (two) times daily as needed for mild constipation.   spironolactone 25 MG tablet Commonly known as: ALDACTONE Take 25 mg by mouth daily.   sucralfate 1 GM/10ML  suspension Commonly known as: CARAFATE Take 10 mLs by mouth 4 (four) times daily -  before meals and at bedtime.   triamcinolone cream 0.1 % Commonly known as: KENALOG Apply 1 application topically 2 (two) times daily.   vitamin A 3 MG (10000 UNITS) capsule Take 1 capsule (10,000 Units total) by mouth daily for 7 days.               Durable Medical Equipment  (From admission, onward)           Start     Ordered   08/30/21 1115  DME 3-in-1  Once       Comments: Bedside comode   08/30/21 1115            Disposition and follow-up:   Amanda Hendricks was discharged from [facility] in Stable condition.  At the hospital follow up visit please address:  Aspiration pneumonia: Ensure patient finishes his antibiotics and has resolution of symptoms. 800 mg liquid Augmentin BID for 4 days at discharge.   Abdominal pain s/p Roux-en-Y (2015): Patient to continue home medications and follow-up with [facility] in the upcoming weeks.   Chronic left hip pain: Will discharge on home regimen. Can consider adding additional non-opioid medications to reduce opioid burden.   Protein-calorie malnutrition: Patient to continue TPN with strict NPO except few sips.   2.  Labs / imaging needed at time of follow-up: CBC, BMP  3.  Pending labs/ test needing follow-up: None  Follow-up Appointments:  Follow-up Information     Jerrol Banana., MD. Call today.   Specialty: Family Medicine Why: Call your PCP today and make an appointment for next week with your PCP. Contact information: [address] [place] [address] [phone]                 Hospital Course by problem list: HPI per Dr. 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  She was recently admitted to our service 1/8-1/11 for a similar issue. After discharge patient reports she felt well for the first 24 hours at home. After this, she tried two tablespoons of broth. Ms. Isip subsequently had nausea and vomiting which have persisted through today. Mentions she has not been able to take any medicine during this time. Otherwise reports having increased productive cough with white mucous. She denies fevers, chills, chest pain, dyspnea. "  #Aspiration pneumonia Initial imaging revealed possible pneumonia during previous admission. Patient had increase in cough during her last hospitalization 1-2d prior to discharge, we initiated oral antibiotics. Unfortunately, she was unable to tolerate any PO intake. Patient reported increase in productive cough after leaving the hospital. No fever or dyspnea over the last  few days. She endorsed significant deconditioned with multiple episodes of nausea and vomiting. Given her chest x-ray findings, possible aspiration pneumonia was suspected. She was initially started on ceftriaxone and azithromycin, but was transitioned to IV Unasyn for anaerobic coverage. Blood cultures were NGTD. Patient was transitioned to liquid Augmentin and was able to tolerate that in the hospital.    #Chest pain (resolved) After initial evaluation for Ms. Capozzoli, had chest pressure and shortness of breath with rw/ SpO2 69%. 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 ASA and sublingual nitroglycerin were given for symptomatic relief  and troponin were ordered which was negative. This was monitored during her hospitalization and did not recur.    #Nausea, vomiting #History of Roux-en-Y gastric bypass #Intestinal stenosis Patient presenting with similar presentation regarding abdominal pain, nausea, and vomiting. This issue is chronic for the patient and is being managed by GI at [facility]. During her  last hospitalization GI saw this patient, recommended follow-up at [facility] for revision of Roux-en-Y. We held off consulting given her primary care is with [facility]. Her previous medications were continued during this hospitalization. Including Misoprostol 283mg four times daily, Pantoprazole 428mtwice daily, Sucralfate 1g three times daily before meals. Patient abdominal, nausea, and vomiting that was exacerbated by eating or drinking water. She was kept NPO other than her meds and she received TPN. Her abdominal pain, nausea, and vomiting improved with this and she was able to tolerate po meds. Her abx was ordered to be liquid to make it easier to tolerate. Has follow up with GI later this month.    #Protein-calorie malnutrition Patient was severely malnourished with recent prolonged hospital stay and overall deconditioning. She required TPN prior to admission and that was continued during her hospitalization.    #Hypomagnesemia  #Hypokalemia Both of these were monitored and repeated as needed.    #Recent L hip fracture Patient reports continued L hip pain. We continued with her home regimen, and had IV breakthrough as needed. Including Oxycodone IR 68m31mvery 4 hours, home duloxetine 31m41mily, IV dilaudid 1 mg every 4 hours as needed for breakthrough. She was discharged on home pain regimen.    #Hypertension Chronic. She was normotensive during her hospital stay and her home meds were held during this admission. She was restarted on home medications at discharge.    #Hypothyroidism Home Synthroid 100mc53mily was continued.    #Bipolar 1 disorder Home Seroquel 300mg 59my was continued.    Discharge Subjective: Stomach has been feeling well, however, she has been having a lot of hip and back pain. States cough is better. Denied any acute concern.   Discharge Exam:   BP 136/86 (BP Location: Left Arm)    Pulse 84    Temp 98.8 F (37.1 C) (Oral)    Resp 17    Ht 5' 7"  (1.702 m)    Wt 54.6 kg     SpO2 94%    BMI 18.85 kg/m  Physical Exam:  General: NAD Lungs: Decreased movement at bases, no wheeze, rhonchi, or rales which is improvement from yesterday.  CV: Normal heart sounds, 2+ pulses Neuro: Alert and oriented x3 Psych: Normal mood and normal affect  Pertinent Labs, Studies, and Procedures:  CBC Latest Ref Rng & Units 08/27/2021 08/23/2021 08/22/2021  WBC 4.0 - 10.5 K/uL 7.7 3.6(L) 3.9(L)  Hemoglobin 12.0 - 15.0 g/dL 9.3(L) 7.9(L) 9.3(L)  Hematocrit 36.0 - 46.0 % 30.5(L) 25.2(L)  29.7(L)  Platelets 150 - 400 K/uL 353 201 211    CMP Latest Ref Rng & Units 08/30/2021 08/29/2021 08/29/2021  Glucose 70 - 99 mg/dL 100(H) 90 362(H)  BUN 6 - 20 mg/dL <5(L) 6 6  Creatinine 0.44 - 1.00 mg/dL 0.44 0.46 0.47  Sodium 135 - 145 mmol/L 132(L) 130(L) 131(L)  Potassium 3.5 - 5.1 mmol/L 4.1 3.3(L) 4.6  Chloride 98 - 111 mmol/L 93(L) 95(L) 94(L)  CO2 22 - 32 mmol/L 34(H) 31 31  Calcium 8.9 - 10.3 mg/dL 8.4(L) 7.9(L) 8.1(L)  Total Protein 6.5 - 8.1 g/dL 5.0(L) 4.8(L) 4.5(L)  Total Bilirubin 0.3 - 1.2 mg/dL 0.2(L) 0.3 0.3  Alkaline Phos 38 - 126 U/L 363(H) 384(H) 396(H)  AST 15 - 41 U/L 29 38 46(H)  ALT 0 - 44 U/L 24 29 28    Lipase 19   DG Chest 2 View  Result Date: 08/27/2021 CLINICAL DATA:  Hypoxia. EXAM: CHEST - 2 VIEW COMPARISON:  August 21, 2021 FINDINGS: Interval development of focal airspace consolidation in the right lower thorax. Cardiomediastinal silhouette is normal. Mediastinal contours appear intact. Stable kyphotic deformity of the spine. IMPRESSION: Interval development of focal airspace consolidation in the right lower thorax, concerning for lobar pneumonia. Aspiration pneumonitis is also a possibility. Electronically Signed   By: Fidela Salisbury M.D.   On: 08/27/2021 17:07     Discharge Instructions: Discharge Instructions     Call MD for:  difficulty breathing, headache or visual disturbances   Complete by: As directed    Call MD for:  extreme fatigue   Complete by: As  directed    Call MD for:  hives   Complete by: As directed    Call MD for:  persistant dizziness or light-headedness   Complete by: As directed    Call MD for:  persistant nausea and vomiting   Complete by: As directed    Call MD for:  redness, tenderness, or signs of infection (pain, swelling, redness, odor or green/yellow discharge around incision site)   Complete by: As directed    Call MD for:  severe uncontrolled pain   Complete by: As directed    Call MD for:  temperature >100.4   Complete by: As directed    Increase activity slowly   Complete by: As directed       It was a pleasure taking care of you!  You presented to the hospital with abdominal pain, nausea, vomiting and shortness of breath. It appears your symptoms were coming from eating and drinking which has exacerbated your pain, nausea and vomiting. Since your getting nutrition through the TPN, we advise you to limit your oral intake to water when taking medications and taking medications that are liquid. The shortness of breath and coughing were exacerbating your abdominal pain which can be coming from your pneumonia. Unfortunately, you were unable to take the medications during the last discharge, so we are making it a liquid antibiotics so you can tolerate it better. You stated you had oxygen at home, so continue using that until your follow up with your primary care physician.   We would like you to resume TPN at home, and limit oral intake as mentioned above until you are able to follow up with [facility] later this month. Hopefully they can fix the underlying issue and you can resume oral intake after that. Please finish the antibiotics.   I hope you get better soon! Signed: Idamae Schuller, MD Tillie Rung. [facility]  Internal Medicine Residency, PGY-1  08/30/2021, 2:59 PM   Pager: [phone]

## 2021-09-02 LAB — CULTURE, BLOOD (ROUTINE X 2)
Culture: NO GROWTH
Culture: NO GROWTH

## 2021-12-06 ENCOUNTER — Encounter: Payer: Self-pay | Admitting: Family Medicine

## 2021-12-06 ENCOUNTER — Ambulatory Visit (INDEPENDENT_AMBULATORY_CARE_PROVIDER_SITE_OTHER): Payer: BC Managed Care – PPO | Admitting: Family Medicine

## 2021-12-06 VITALS — BP 122/90 | HR 98 | Temp 99.0°F | Wt 122.0 lb

## 2021-12-06 DIAGNOSIS — S72009A Fracture of unspecified part of neck of unspecified femur, initial encounter for closed fracture: Secondary | ICD-10-CM

## 2021-12-06 DIAGNOSIS — Z9884 Bariatric surgery status: Secondary | ICD-10-CM | POA: Diagnosis not present

## 2021-12-06 DIAGNOSIS — E7800 Pure hypercholesterolemia, unspecified: Secondary | ICD-10-CM

## 2021-12-06 DIAGNOSIS — I10 Essential (primary) hypertension: Secondary | ICD-10-CM | POA: Diagnosis not present

## 2021-12-06 DIAGNOSIS — J690 Pneumonitis due to inhalation of food and vomit: Secondary | ICD-10-CM | POA: Diagnosis not present

## 2021-12-06 DIAGNOSIS — K759 Inflammatory liver disease, unspecified: Secondary | ICD-10-CM

## 2021-12-06 DIAGNOSIS — K279 Peptic ulcer, site unspecified, unspecified as acute or chronic, without hemorrhage or perforation: Secondary | ICD-10-CM

## 2021-12-06 DIAGNOSIS — F17200 Nicotine dependence, unspecified, uncomplicated: Secondary | ICD-10-CM

## 2021-12-06 DIAGNOSIS — E46 Unspecified protein-calorie malnutrition: Secondary | ICD-10-CM

## 2021-12-06 NOTE — Progress Notes (Addendum)
? ?New Patient Office Visit ? ?Subjective   ? ?Patient ID: Amanda Hendricks, female    DOB: 08/22/1965  Age: 56 y.o. MRN: [id number] ? ?CC: No chief complaint on file. ? ? ?HPI ?Amanda Hendricks presents to establish care.  Patient presents with recent (1 year) complicated medical issues.  About 1 year ago she fell and fracture 2 vertebrae.  While recovering from that she was unable to keep food down.  Due to the emesis she had episode of aspiration and developed pneumonia.  While recovering from her pneumonia she fell again and fractured her hip and femur.  She had to have hip replacement the day after the fall.  However there was no repair of the femur.  Patient had complications from the hip surgery due to the untreated femur fracture and had to undergo another hip replacement and surgery for the femur.  Following the second hip replacement patient developed another pneumonia while also having the flu.  She developed ulcers that began to bleed requiring multiple infusions before the decision was made to do a bariatric reversal surgery.  Patient had feeding tube placed at that time and has just started back eating food.  She still has tube in place and is scheduled to have it removed tomorrow. ?She has been in [facility] for rehab for the past couple of months and is now at home with her mother. ?Outpatient Encounter Medications as of 12/06/2021  ?Medication Sig  ? acetaminophen (TYLENOL) 325 MG tablet Take 650 mg by mouth every 6 (six) hours as needed for mild pain or headache.  ? atorvastatin (LIPITOR) 10 MG tablet Take 10 mg by mouth at bedtime.  ? calcium carbonate (OS-CAL) 1250 (500 Ca) MG chewable tablet Chew 500 mg by mouth 3 (three) times daily as needed for heartburn.  ? divalproex (DEPAKOTE) 250 MG DR tablet Take 250 mg by mouth 2 (two) times daily.  ? DULoxetine (CYMBALTA) 30 MG capsule Take 30 mg by mouth daily.  ? ferrous sulfate 325 (65 FE) MG EC tablet Take 1 tablet by mouth daily with breakfast.  ?  levothyroxine (SYNTHROID) 100 MCG tablet Take 100 mcg by mouth daily.  ? Magnesium Chloride 64 MG TBEC Take 535 mg by mouth in the morning and at bedtime.  ? metoprolol tartrate (LOPRESSOR) 25 MG tablet Take 25 mg by mouth in the morning and at bedtime.  ? Multiple Vitamins-Minerals (BARIATRIC MULTIVITAMINS/IRON) CAPS Take 1 tablet by mouth at bedtime.  ? ondansetron (ZOFRAN-ODT) 4 MG disintegrating tablet Take 4 mg by mouth every 8 (eight) hours as needed for nausea or vomiting.  ? oxyCODONE HCl 15 MG TABA Take by mouth.  ? pantoprazole (PROTONIX) 40 MG tablet Take 40 mg by mouth 2 (two) times daily.  ? polyethylene glycol powder (GLYCOLAX/MIRALAX) 17 GM/SCOOP powder Take 17 g by mouth daily as needed for mild constipation.  ? potassium chloride (KLOR-CON) 10 MEQ tablet Take 10 mEq by mouth daily.  ? promethazine (PHENERGAN) 12.5 MG tablet Take 12.5 mg by mouth every 8 (eight) hours as needed for nausea or vomiting.  ? QUEtiapine (SEROQUEL) 300 MG tablet Take 300 mg by mouth at bedtime.  ? senna-docusate (SENOKOT-S) 8.6-50 MG tablet Take 2 tablets by mouth 2 (two) times daily as needed for mild constipation.  ? triamcinolone cream (KENALOG) 0.1 % Apply 1 application topically 2 (two) times daily.  ? [DISCONTINUED] lamoTRIgine (LAMICTAL) 200 MG tablet Take 200 mg by mouth daily.  ? [DISCONTINUED] methocarbamol (ROBAXIN)  500 MG tablet Take 1,000 mg by mouth every 8 (eight) hours as needed for muscle spasms.  ? [DISCONTINUED] misoprostol (CYTOTEC) 200 MCG tablet Take 200 mcg by mouth 4 (four) times daily.  ? [DISCONTINUED] oxyCODONE (OXY IR/ROXICODONE) 5 MG immediate release tablet Take 5 mg by mouth every 4 (four) hours as needed for moderate pain.  ? [DISCONTINUED] pregabalin (LYRICA) 75 MG capsule Take 75 mg by mouth daily.  ? [DISCONTINUED] prochlorperazine (COMPAZINE) 5 MG tablet Take 5 mg by mouth every 8 (eight) hours as needed for nausea or vomiting.  ? [DISCONTINUED] spironolactone (ALDACTONE) 25 MG tablet  Take 25 mg by mouth daily.  ? [DISCONTINUED] sucralfate (CARAFATE) 1 GM/10ML suspension Take 10 mLs by mouth 4 (four) times daily -  before meals and at bedtime.  ? ?No facility-administered encounter medications on file as of 12/06/2021.  ? ? ?Past Medical History:  ?Diagnosis Date  ? Allergy   ? Arthritis   ? Blood transfusion without reported diagnosis   ? Cancer [facility])   ? Compressed spine fracture ([place])   ? Depression   ? Femur fracture ([place])   ? Heart murmur   ? Hip fracture ([place])   ? History of stomach ulcers   ? Hx of gastric bypass   ? Hypertension   ? Osteoporosis   ? Substance abuse ([place])   ? Thyroid disease   ? ? ?Past Surgical History:  ?Procedure Laterality Date  ? GASTRIC BYPASS  2015  ? ? ?Family History  ?Problem Relation Age of Onset  ? Arthritis Mother   ? Hypertension Mother   ? Heart disease Mother   ? COPD Mother   ? ? ?Social History  ? ?Socioeconomic History  ? Marital status: Legally Separated  ?  Spouse name: Not on file  ? Number of children: Not on file  ? Years of education: Not on file  ? Highest education level: Not on file  ?Occupational History  ? Not on file  ?Tobacco Use  ? Smoking status: Never  ? Smokeless tobacco: Never  ?Substance and Sexual Activity  ? Alcohol use: Not Currently  ? Drug use: Not Currently  ? Sexual activity: Not on file  ?Other Topics Concern  ? Not on file  ?Social History Narrative  ? Not on file  ? ?Social Determinants of Health  ? ?Financial Resource Strain: Not on file  ?Food Insecurity: Not on file  ?Transportation Needs: Not on file  ?Physical Activity: Not on file  ?Stress: Not on file  ?Social Connections: Not on file  ?Intimate Partner Violence: Not on file  ? ? ?ROS ? ?  ? ? ?Objective   ? ?BP 122/90 (BP Location: Left Arm, Patient Position: Sitting, Cuff Size: Normal)   Pulse 98   Temp 99 ?F (37.2 ?C) (Oral)   Wt 122 lb (55.3 kg)   SpO2 100%   BMI 19.11 kg/m?  ? ?Physical Exam ?Vitals reviewed.  ?Constitutional:   ?   General: She is not in  acute distress. ?   Appearance: She is well-developed.  ?HENT:  ?   Head: Normocephalic and atraumatic.  ?   Right Ear: Hearing normal.  ?   Left Ear: Hearing normal.  ?   Nose: Nose normal.  ?Eyes:  ?   General: Lids are normal. No scleral icterus.    ?   Right eye: No discharge.     ?   Left eye: No discharge.  ?   Conjunctiva/sclera: Conjunctivae normal.  ?  Cardiovascular:  ?   Rate and Rhythm: Normal rate and regular rhythm.  ?   Heart sounds: Normal heart sounds.  ?Pulmonary:  ?   Effort: Pulmonary effort is normal. No respiratory distress.  ?   Breath sounds: Normal breath sounds.  ?Abdominal:  ?   Palpations: Abdomen is soft.  ?Skin: ?   General: Skin is warm and dry.  ?   Findings: No lesion or rash.  ?Neurological:  ?   General: No focal deficit present.  ?   Mental Status: She is alert and oriented to person, place, and time.  ?Psychiatric:     ?   Mood and Affect: Mood normal.     ?   Speech: Speech normal.     ?   Behavior: Behavior normal.     ?   Thought Content: Thought content normal.     ?   Judgment: Judgment normal.  ? ? ? ?  ? ?Assessment & Plan:  ? ?Problem List Items Addressed This Visit   ?None ?1. Aspiration pneumonia of right lung due to vomit, unspecified part of lung ([place]) ?The patient has had at least 2 aspiration pneumonias and has a j-tube in place without reason.  She is back to eating some now and hopes to have the J-tube removed tomorrow. ?See her back in a couple of months. ?2. Primary hypertension ?Good blood pressure control ? ?3. Protein-calorie malnutrition, unspecified severity ([place]) ?Patient will work on eating slowly but getting in plenty of calories and protein. ?More than 50% 45 minute visit spent in counseling or coordination of care. ? ?4. History of Roux-en-Y gastric bypass ?Patient had gastric bypass procedure with a complete Roux-en-Y but had to have it undone when she had bleeding ulcers that led to prolonged hospitalization. ? ?5. Recent fracture of hip ([place]) ?Walking  and seeing physical therapy. ? ?6. 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

## 2021-12-11 ENCOUNTER — Emergency Department
Admission: EM | Admit: 2021-12-11 | Discharge: 2021-12-12 | Disposition: A | Discharge status: Home / Self Care | Payer: Medicaid Other | Attending: Emergency Medicine | Admitting: Emergency Medicine

## 2021-12-11 ENCOUNTER — Telehealth: Payer: Self-pay

## 2021-12-11 ENCOUNTER — Other Ambulatory Visit: Payer: Self-pay

## 2021-12-11 ENCOUNTER — Encounter: Payer: Self-pay | Admitting: Emergency Medicine

## 2021-12-11 DIAGNOSIS — R112 Nausea with vomiting, unspecified: Secondary | ICD-10-CM | POA: Diagnosis not present

## 2021-12-11 DIAGNOSIS — K9413 Enterostomy malfunction: Secondary | ICD-10-CM | POA: Insufficient documentation

## 2021-12-11 DIAGNOSIS — R748 Abnormal levels of other serum enzymes: Secondary | ICD-10-CM | POA: Insufficient documentation

## 2021-12-11 DIAGNOSIS — T85528A Displacement of other gastrointestinal prosthetic devices, implants and grafts, initial encounter: Secondary | ICD-10-CM

## 2021-12-11 DIAGNOSIS — E876 Hypokalemia: Secondary | ICD-10-CM | POA: Diagnosis not present

## 2021-12-11 DIAGNOSIS — L02211 Cutaneous abscess of abdominal wall: Secondary | ICD-10-CM

## 2021-12-11 LAB — CBC
HCT: 36.6 % (ref 36.0–46.0)
Hemoglobin: 11.1 g/dL — ABNORMAL LOW (ref 12.0–15.0)
MCH: 26.4 pg (ref 26.0–34.0)
MCHC: 30.3 g/dL (ref 30.0–36.0)
MCV: 87.1 fL (ref 80.0–100.0)
Platelets: 305 10*3/uL (ref 150–400)
RBC: 4.2 MIL/uL (ref 3.87–5.11)
RDW: 16 % — ABNORMAL HIGH (ref 11.5–15.5)
WBC: 8.8 10*3/uL (ref 4.0–10.5)
nRBC: 0 % (ref 0.0–0.2)

## 2021-12-11 LAB — URINALYSIS, ROUTINE W REFLEX MICROSCOPIC
Bacteria, UA: NONE SEEN
Bilirubin Urine: NEGATIVE
Glucose, UA: NEGATIVE mg/dL
Ketones, ur: NEGATIVE mg/dL
Leukocytes,Ua: NEGATIVE
Nitrite: NEGATIVE
Protein, ur: NEGATIVE mg/dL
Specific Gravity, Urine: 1.011 (ref 1.005–1.030)
pH: 6 (ref 5.0–8.0)

## 2021-12-11 LAB — COMPREHENSIVE METABOLIC PANEL
ALT: 37 U/L (ref 0–44)
AST: 34 U/L (ref 15–41)
Albumin: 2.7 g/dL — ABNORMAL LOW (ref 3.5–5.0)
Alkaline Phosphatase: 181 U/L — ABNORMAL HIGH (ref 38–126)
Anion gap: 15 (ref 5–15)
BUN: 12 mg/dL (ref 6–20)
CO2: 24 mmol/L (ref 22–32)
Calcium: 8 mg/dL — ABNORMAL LOW (ref 8.9–10.3)
Chloride: 97 mmol/L — ABNORMAL LOW (ref 98–111)
Creatinine, Ser: 0.59 mg/dL (ref 0.44–1.00)
GFR, Estimated: >60 mL/min (ref >60)
Glucose, Bld: 96 mg/dL (ref 70–99)
Potassium: 2.6 mmol/L — CL (ref 3.5–5.1)
Sodium: 136 mmol/L (ref 135–145)
Total Bilirubin: 0.3 mg/dL (ref 0.3–1.2)
Total Protein: 6.7 g/dL (ref 6.5–8.1)

## 2021-12-11 LAB — LIPASE, BLOOD: Lipase: 33 U/L (ref 11–51)

## 2021-12-11 LAB — MAGNESIUM: Magnesium: 1.7 mg/dL (ref 1.7–2.4)

## 2021-12-11 MED ORDER — ONDANSETRON HCL 4 MG/2ML IJ SOLN
4.0000 mg | INTRAMUSCULAR | Status: AC
  Administered 2021-12-12: 4 mg via INTRAVENOUS
  Filled 2021-12-11: qty 2

## 2021-12-11 MED ORDER — MORPHINE SULFATE (PF) 4 MG/ML IV SOLN
4.0000 mg | Freq: Once | INTRAVENOUS | Status: AC
  Administered 2021-12-12: 4 mg via INTRAVENOUS
  Filled 2021-12-11: qty 1

## 2021-12-11 MED ORDER — POTASSIUM CHLORIDE 10 MEQ/100ML IV SOLN
10.0000 meq | Freq: Once | INTRAVENOUS | Status: AC
  Administered 2021-12-12: 10 meq via INTRAVENOUS
  Filled 2021-12-11: qty 100

## 2021-12-11 MED ORDER — MAGNESIUM SULFATE 2 GM/50ML IV SOLN
2.0000 g | Freq: Once | INTRAVENOUS | Status: AC
Start: 2021-12-12 — End: 2021-12-12
  Administered 2021-12-12: 2 g via INTRAVENOUS
  Filled 2021-12-11: qty 50

## 2021-12-11 NOTE — ED Notes (Signed)
Pts dressing changed by this RN - thick yellowish colored drainage from previous incision sites below the patients feeding tube.  ?

## 2021-12-11 NOTE — ED Triage Notes (Signed)
Pt presents via POV for a wound check with associated lower abdominal pain near her tube site. She has a feeding tube (placed at [facility]) and is developing redness around the site with drainage and was advised by her surgeon to have to a CT to evaluate for the development of an abscess. Denies SOB or CP.  ?

## 2021-12-11 NOTE — Telephone Encounter (Signed)
Patient's mother states patient cannot get an appt with Psychiatrist until July for a new patient.    She is almost out of Duloxetine 30 mg. 3 tab by mouth once daily,  Quetiapine 300 mg. 1 q hs. And Trazodone 100 mg. 1 qhs prn for sleep.  Please send to [facility] on [address] of [address]

## 2021-12-11 NOTE — Addendum Note (Signed)
Addended by: Eulas Post on: 12/11/2021 09:38 AM ? ? Modules accepted: Orders ? ?

## 2021-12-11 NOTE — ED Provider Notes (Signed)
? ?[facility] ?Provider Note ? ? ? Event Date/Time  ? First MD Initiated Contact with Patient 12/11/21 2320   ?  (approximate) ? ? ?History  ? ?Abdominal Pain and Wound Check ? ? ?HPI ? ?Amanda Hendricks is a 56 y.o. female who presents for evaluation of pain and worsening drainage in her lower abdomen and at the site of surgical wounds from a Roux-en-Y bypass reversal that occurred at [facility] about 2 months ago.  She said that she has been struggling with these wounds for an extended period of time and her surgeon believes that there is likely an abdominal wall abscess.  She saw the surgeon in clinic about 5 days ago and the plan was for an outpatient CT scan which is actually scheduled at [facility] in 4 days.  However over the last 1 to 2 days she said that she has gotten worse and as of today the pain has been so severe that it is making her sick to the stomach and the surrounding redness has gotten worse, as well as the amount of drainage from the 2 "holes" in her lower abdomen.  She still has a J-tube in place and the tube site has become quite painful, though she is not using it for nutrition and is able to eat and drink without difficulty.  She has been having nausea and vomiting.  She denies fever, chest pain, and shortness of breath.  She came in tonight because of the worsening pain, worsening drainage, and nausea and vomiting and did not feel like she could make it until getting the outpatient scan in several days. ?  ? ? ?Physical Exam  ? ?Triage Vital Signs: ?ED Triage Vitals  ?Enc Vitals Group  ?   BP 12/11/21 1924 (!) 159/94  ?   Pulse Rate 12/11/21 1924 96  ?   Resp 12/11/21 1924 18  ?   Temp 12/11/21 1924 98.6 ?F (37 ?C)  ?   Temp Source 12/11/21 1924 Oral  ?   SpO2 12/11/21 1924 98 %  ?   Weight 12/11/21 1922 54 kg (119 lb)  ?   Height 12/11/21 1922 1.702 m (5' 7" )  ?   Head Circumference --   ?   Peak Flow --   ?   Pain Score 12/11/21 1922 8  ?   Pain Loc --   ?   Pain Edu? --   ?    Excl. in [place]? --   ? ? ?Most recent vital signs: ?Vitals:  ? 12/12/21 0230 12/12/21 0300  ?BP: (!) 154/94 (!) 156/92  ?Pulse: 82 81  ?Resp: 15 16  ?Temp:    ?SpO2: 100% 100%  ? ? ? ?General: Awake, no distress.  Appears chronically ill but nontoxic at this time. ?CV:  Good peripheral perfusion.  Regular rate and rhythm ?Resp:  Normal effort.  Lungs are clear to auscultation. ?Abd:  Extensive chronic scarring and sequela of prior surgeries.  She has a J-tube in place.  There is erythema and induration around what appears to be 2 surgical wounds, and both wounds are draining a mixed contents liquid which could be purulence, serosanguineous fluid, and/or other contents.  She said that this is increased.  The patient has generalized abdominal tenderness to palpation and is more tender around the area that appears cellulitic, but she is not peritoneal. ? ? ? ? ? ?ED Results / Procedures / Treatments  ? ?Labs ?(all labs ordered are listed, but  only abnormal results are displayed) ?Labs Reviewed  ?COMPREHENSIVE METABOLIC PANEL - Abnormal; Notable for the following components:  ?    Result Value  ? Potassium 2.6 (*)   ? Chloride 97 (*)   ? Calcium 8.0 (*)   ? Albumin 2.7 (*)   ? Alkaline Phosphatase 181 (*)   ? All other components within normal limits  ?CBC - Abnormal; Notable for the following components:  ? Hemoglobin 11.1 (*)   ? RDW 16.0 (*)   ? All other components within normal limits  ?URINALYSIS, ROUTINE W REFLEX MICROSCOPIC - Abnormal; Notable for the following components:  ? Color, Urine YELLOW (*)   ? APPearance CLEAR (*)   ? Hgb urine dipstick SMALL (*)   ? All other components within normal limits  ?LIPASE, BLOOD  ?MAGNESIUM  ?LACTIC ACID, PLASMA  ? ? ? ?RADIOLOGY ?Displaced J-tube, balloon within subcutaneous tissue.  Inflammatory and/or infectious changes but without large drainable fluid collection or abscess.  No evidence of SBO or ileus. ? ? ? ?PROCEDURES: ? ?Critical Care performed: No ? ?.1-3 Lead EKG  Interpretation ?Performed by: Hinda Kehr, MD ?Authorized by: Hinda Kehr, MD  ? ?  ECG rate:  94 ?  ECG rate assessment: normal   ?  Rhythm: sinus rhythm   ?  Ectopy: none   ?  Conduction: normal   ? ? ?MEDICATIONS ORDERED IN ED: ?Medications  ?potassium chloride 10 mEq in 100 mL IVPB (0 mEq Intravenous Stopped 12/12/21 0149)  ?magnesium sulfate IVPB 2 g 50 mL (0 g Intravenous Stopped 12/12/21 0237)  ?morphine (PF) 4 MG/ML injection 4 mg (4 mg Intravenous Given 12/12/21 0015)  ?ondansetron [facility]) injection 4 mg (4 mg Intravenous Given 12/12/21 0014)  ?iohexol (OMNIPAQUE) 300 MG/ML solution 100 mL (75 mLs Intravenous Contrast Given 12/12/21 0025)  ?lactated ringers bolus 1,000 mL (0 mLs Intravenous Stopped 12/12/21 0237)  ?morphine (PF) 4 MG/ML injection 4 mg (4 mg Intravenous Given 12/12/21 0056)  ?iohexol (OMNIPAQUE) 350 MG/ML injection 75 mL (53 mLs Intravenous Contrast Given 12/12/21 0111)  ?potassium chloride SA (KLOR-CON M) CR tablet 40 mEq (40 mEq Oral Given 12/12/21 0243)  ? ? ? ?IMPRESSION / MDM / ASSESSMENT AND PLAN / ED COURSE  ?I reviewed the triage vital signs and the nursing notes. ?             ?               ? ?Differential diagnosis includes, but is not limited to, surgical wound infection, abdominal wall abscess, enterocutaneous fistula or other type of fistula, bacteremia. ? ?Patient is relatively well-appearing in spite of her chronic issues and the current acute on chronic exacerbation of her surgical wounds.  I reviewed her [facility] surgery note from 12/07/2021 and verified that her surgeon, Dr. Shawna Orleans, planned for an outpatient CT scan of the abdomen and pelvis and is worried about abdominal wall abscess. ? ?The patient's vital signs are generally stable and her heart rate is below 100 even though it is greater than 90.  Labs ordered initially include CMP, urinalysis, lipase, magnesium, CBC. ? ?I reviewed the results and her CMP is notable for a potassium of 2.6 which is severely depleted and requires repletion.   Her alkaline phosphatase is slightly elevated at 181 but this may not be clinically significant.  CBC is surprisingly within normal limits including no leukocytosis.  Her urinalysis is reassuring with no evidence of infection.  Lipase is within normal limits.  Magnesium  is low normal at 1.7. ? ?I ordered 2 g of IV magnesium to help with potassium repletion and I ordered potassium 10 mill equivalents IV to get started on repletion.  I am holding off on any oral medications or any other oral intake until we know the results of the CT scan and whether or not she requires urgent/emergent surgery.  I added on a lactic acid level. ? ?Patient reports significant pain and some intermittent but persistent nausea, so I ordered morphine 4 mg IV and Zofran 4 mg IV. ? ?The patient is on the cardiac monitor to evaluate for evidence of arrhythmia and/or significant heart rate changes. ? ?Clinical Course as of 12/12/21 0717  ?Tue Dec 12, 2021  ?0033 Lactic Acid, Venous: 1.0 ?Lactic acid within normal limits, canceling repeat [CF]  ?4010 CT ABDOMEN PELVIS W CONTRAST ?Dr. Golden Circle with radiology called me about the CT scan of the abdomen pelvis.  He reports that the balloon from the J-tube is in the subcutaneous tissue and there is some subcutaneous air and inflammatory changes and some fluid that is communicating with the wounds inferior to the J-tube site, but there is no large drainable abscess or fluid collection.  He also reports that he sees what he thinks is a pulmonary embolism in the left lower lobe.  He recommends proceeding now with a CTA chest to see the extent of the PE; even though the patient received a dose of IV contrast already, it was a small dose, and he feels that her kidney function is adequate and sufficient.  I am certainly on board for this plan.  I will contact the CT technologist and let them know the plan and proceed with CTA chest. ? ?I personally reviewed the CT abdomen/pelvis and see the abnormalities  mentioned by the radiologist including the misplaced J-tube.  Most likely I will need to discuss this with the surgeons at [facility] given that the patient is having worsening pain, drainage, hypokalemia that q

## 2021-12-11 NOTE — Telephone Encounter (Signed)
Copied from [place]. Topic: General - Other ?>> Dec 11, 2021  3:45 PM Parke Poisson wrote: ?Reason for CRM: Insurance company would like to speak peer to peer to get Ct approved.Phone # [phone] ?Member # [id number] ?Caae # [phone] ?

## 2021-12-12 ENCOUNTER — Emergency Department: Payer: Medicaid Other

## 2021-12-12 LAB — LACTIC ACID, PLASMA: Lactic Acid, Venous: 1 mmol/L (ref 0.5–1.9)

## 2021-12-12 MED ORDER — MORPHINE SULFATE (PF) 4 MG/ML IV SOLN
4.0000 mg | Freq: Once | INTRAVENOUS | Status: AC
  Administered 2021-12-12: 4 mg via INTRAVENOUS
  Filled 2021-12-12: qty 1

## 2021-12-12 MED ORDER — POTASSIUM CHLORIDE CRYS ER 20 MEQ PO TBCR
40.0000 meq | EXTENDED_RELEASE_TABLET | Freq: Once | ORAL | Status: AC
Start: 2021-12-12 — End: 2021-12-12
  Administered 2021-12-12: 40 meq via ORAL
  Filled 2021-12-12: qty 2

## 2021-12-12 MED ORDER — IOHEXOL 300 MG/ML  SOLN
100.0000 mL | Freq: Once | INTRAMUSCULAR | Status: AC | PRN
  Administered 2021-12-12: 75 mL via INTRAVENOUS

## 2021-12-12 MED ORDER — IOHEXOL 350 MG/ML SOLN
75.0000 mL | Freq: Once | INTRAVENOUS | Status: AC | PRN
  Administered 2021-12-12: 53 mL via INTRAVENOUS

## 2021-12-12 MED ORDER — LACTATED RINGERS IV BOLUS
1000.0000 mL | Freq: Once | INTRAVENOUS | Status: AC
  Administered 2021-12-12: 1000 mL via INTRAVENOUS

## 2021-12-12 MED ORDER — ONDANSETRON 4 MG PO TBDP: ORAL_TABLET | ORAL | 0 refills | Status: DC

## 2021-12-12 MED ORDER — POTASSIUM CHLORIDE CRYS ER 20 MEQ PO TBCR: 20.0000 meq | EXTENDED_RELEASE_TABLET | Freq: Two times a day (BID) | ORAL | 0 refills | Status: DC

## 2021-12-12 NOTE — ED Notes (Signed)
Pt to Radiology at this time via stretcher.  ?

## 2021-12-12 NOTE — ED Notes (Signed)
Pt returned to radiology via stretcher ?

## 2021-12-12 NOTE — Discharge Instructions (Signed)
As we discussed, your J-tube is somewhat displaced, with the bulb stuck with in the tissue of your abdominal wall.  There is not a need for emergent surgery, but you definitely need to follow-up at the next available opportunity with your surgeon to discuss the next step.  We had our CT technologists send the CT scans to [facility]) so that your surgeon could see them. ? ?We discussed transferring you to [facility] given the displaced J-tube and the fact that your potassium was quite low, apparently from the vomiting you had been experiencing.  However because you are feeling better, it is your strong preference to go home, so we will honor that.  However, please return immediately to the nearest emergency department, or go directly to [facility] if you feel safe to do so, if you develop any new or worsening symptoms that concern you.  Please continue taking your regular medications as well as the potassium supplement and as needed nausea medicine prescribed tonight. ?

## 2021-12-12 NOTE — Telephone Encounter (Signed)
Does CT still need to be done? Looks like it was done through ER but with contrast only ?

## 2021-12-14 ENCOUNTER — Other Ambulatory Visit: Payer: Self-pay

## 2021-12-14 MED ORDER — QUETIAPINE FUMARATE 300 MG PO TABS: 300.0000 mg | ORAL_TABLET | Freq: Every day | ORAL | 3 refills | Status: DC

## 2021-12-14 MED ORDER — TRAZODONE HCL 100 MG PO TABS: 100.0000 mg | ORAL_TABLET | Freq: Every day | ORAL | 3 refills | Status: AC

## 2021-12-14 MED ORDER — DULOXETINE HCL 30 MG PO CPEP: 30.0000 mg | ORAL_CAPSULE | Freq: Every day | ORAL | 11 refills | Status: DC

## 2021-12-15 ENCOUNTER — Ambulatory Visit: Admission: RE | Admit: 2021-12-15 | Payer: BC Managed Care – PPO | Source: Ambulatory Visit

## 2021-12-15 ENCOUNTER — Other Ambulatory Visit (HOSPITAL_COMMUNITY): Payer: Self-pay

## 2022-01-29 ENCOUNTER — Telehealth: Payer: Self-pay | Admitting: Family Medicine

## 2022-01-29 NOTE — Telephone Encounter (Signed)
Pt is calling Amanda Hendricks Back about her medications please advise CB- [phone]

## 2022-01-29 NOTE — Telephone Encounter (Signed)
Please refer to mpsychiatry as above.

## 2022-01-30 ENCOUNTER — Other Ambulatory Visit: Payer: Self-pay | Admitting: *Deleted

## 2022-01-30 DIAGNOSIS — F411 Generalized anxiety disorder: Secondary | ICD-10-CM

## 2022-01-30 NOTE — Telephone Encounter (Signed)
Referral was ordered.

## 2022-03-07 ENCOUNTER — Ambulatory Visit (INDEPENDENT_AMBULATORY_CARE_PROVIDER_SITE_OTHER): Payer: Medicaid Other | Admitting: Family Medicine

## 2022-03-07 DIAGNOSIS — E46 Unspecified protein-calorie malnutrition: Secondary | ICD-10-CM

## 2022-03-07 DIAGNOSIS — Z9884 Bariatric surgery status: Secondary | ICD-10-CM | POA: Diagnosis not present

## 2022-03-07 DIAGNOSIS — F411 Generalized anxiety disorder: Secondary | ICD-10-CM

## 2022-03-07 DIAGNOSIS — E249 Cushing's syndrome, unspecified: Secondary | ICD-10-CM

## 2022-03-07 DIAGNOSIS — M8000XD Age-related osteoporosis with current pathological fracture, unspecified site, subsequent encounter for fracture with routine healing: Secondary | ICD-10-CM

## 2022-03-07 DIAGNOSIS — K759 Inflammatory liver disease, unspecified: Secondary | ICD-10-CM

## 2022-03-07 DIAGNOSIS — F17200 Nicotine dependence, unspecified, uncomplicated: Secondary | ICD-10-CM

## 2022-03-07 DIAGNOSIS — M25552 Pain in left hip: Secondary | ICD-10-CM

## 2022-03-07 DIAGNOSIS — J690 Pneumonitis due to inhalation of food and vomit: Secondary | ICD-10-CM

## 2022-03-07 MED ORDER — CHANTIX STARTING MONTH PAK 0.5 MG X 11 & 1 MG X 42 PO TBPK: ORAL_TABLET | ORAL | 0 refills | Status: DC

## 2022-03-07 MED ORDER — QUETIAPINE FUMARATE 300 MG PO TABS: 300.0000 mg | ORAL_TABLET | Freq: Every day | ORAL | 3 refills | Status: DC

## 2022-03-07 NOTE — Progress Notes (Signed)
Argentina Ponder DeSanto,acting as a scribe for Wilhemena Durie, MD.,have documented all relevant documentation on the behalf of Wilhemena Durie, MD,as directed by  Wilhemena Durie, MD while in the presence of Wilhemena Durie, MD.     Established patient visit   Patient: Amanda Hendricks   DOB: 15-May-1966   57 y.o. Female  MRN: [id number] Visit Date: 03/07/2022  Today's healthcare provider: Wilhemena Durie, MD   No chief complaint on file.  Subjective    HPI  Patient is a 56 year old female who presents today for 3 month follow up.  At her last visit in April she seen primarily for her hospital follow up of aspiration pneumonia.  Patient had multiple complications but was improving.   Patient had also been seen for her hypertension which was well controlled at that time.   Patient states that the feeding tube came out on it's own and now she has a hole in her small intestine.  She has specialists treating this.  She complains of her pain still being present and problematic.  Her leg/hip pain is coming from the plate they had to put in place and her specialist basically said this is something that will be chronic.  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  Unless something worse were to happen they do not plan any surgery.  Patient continues to take oxycodone daily and we had a discussion about her addiction issues.  She is very aware of this. Patient feels she is completely over her pneumonia.   Patient does wish to get Rx for Chantix.  She is also running out of Seroquel and has not yet been set up with psychiatry  Medications: Outpatient Medications Prior to Visit  Medication Sig   acetaminophen (TYLENOL) 325 MG tablet Take 650 mg by mouth every 6 (six) hours as needed for mild pain or headache.   atorvastatin (LIPITOR) 10 MG tablet Take 10 mg by mouth at  bedtime.   calcium carbonate (OS-CAL) 1250 (500 Ca) MG chewable tablet Chew 500 mg by mouth 3 (three) times daily as needed for heartburn.   divalproex (DEPAKOTE) 250 MG DR tablet Take 250 mg by mouth 2 (two) times daily.   DULoxetine (CYMBALTA) 30 MG capsule Take 1 capsule (30 mg total) by mouth daily.   ferrous sulfate 325 (65 FE) MG EC tablet Take 1 tablet by mouth daily with breakfast.   levothyroxine (SYNTHROID) 100 MCG tablet Take 100 mcg by mouth daily.   Magnesium Chloride 64 MG TBEC Take 535 mg by mouth in the morning and at bedtime.   metoprolol tartrate (LOPRESSOR) 25 MG tablet Take 25 mg by mouth in the morning and at bedtime.   Multiple Vitamins-Minerals (BARIATRIC MULTIVITAMINS/IRON) CAPS Take 1 tablet by mouth at bedtime.   ondansetron (ZOFRAN-ODT) 4 MG disintegrating tablet Allow 1-2 tablets to dissolve in your mouth every 8 hours as needed for nausea/vomiting   oxyCODONE HCl 15 MG TABA Take by mouth.   pantoprazole (PROTONIX) 40 MG tablet Take 40 mg by mouth 2 (two) times daily.   polyethylene glycol powder (GLYCOLAX/MIRALAX) 17 GM/SCOOP powder Take 17 g by mouth daily as needed for mild constipation.   potassium chloride (KLOR-CON) 10 MEQ tablet Take 10 mEq by mouth daily.   potassium chloride SA (KLOR-CON M20) 20 MEQ tablet Take 1 tablet (  20 mEq total) by mouth 2 (two) times daily for 7 days.   promethazine (PHENERGAN) 12.5 MG tablet Take 12.5 mg by mouth every 8 (eight) hours as needed for nausea or vomiting.   QUEtiapine (SEROQUEL) 300 MG tablet Take 1 tablet (300 mg total) by mouth at bedtime.   senna-docusate (SENOKOT-S) 8.6-50 MG tablet Take 2 tablets by mouth 2 (two) times daily as needed for mild constipation.   traZODone (DESYREL) 100 MG tablet Take 1 tablet (100 mg total) by mouth at bedtime.   triamcinolone cream (KENALOG) 0.1 % Apply 1 application topically 2 (two) times daily.   No facility-administered medications prior to visit.    Review of Systems  Last  CBC Lab Results  Component Value Date   WBC 8.8 12/11/2021   HGB 11.1 (L) 12/11/2021   HCT 36.6 12/11/2021   MCV 87.1 12/11/2021   MCH 26.4 12/11/2021   RDW 16.0 (H) 12/11/2021   PLT 305 12/11/2021       Objective    There were no vitals taken for this visit. BP Readings from Last 3 Encounters:  12/12/21 (!) 156/92  12/06/21 122/90  08/30/21 136/86   Wt Readings from Last 3 Encounters:  12/11/21 119 lb (54 kg)  12/06/21 122 lb (55.3 kg)  08/28/21 120 lb 5.9 oz (54.6 kg)      Physical Exam Vitals reviewed.  Constitutional:      General: She is not in acute distress.    Appearance: She is well-developed.  HENT:     Head: Normocephalic and atraumatic.     Right Ear: Hearing normal.     Left Ear: Hearing normal.     Nose: Nose normal.  Eyes:     General: Lids are normal. No scleral icterus.       Right eye: No discharge.        Left eye: No discharge.     Conjunctiva/sclera: Conjunctivae normal.  Cardiovascular:     Rate and Rhythm: Normal rate and regular rhythm.     Heart sounds: Normal heart sounds.  Pulmonary:     Effort: Pulmonary effort is normal. No respiratory distress.     Breath sounds: Normal breath sounds.  Abdominal:     Palpations: Abdomen is soft.     Comments: One of the holes from the feeding tube centrally is still draining some.  There is a bandage over this. The feeding tube actually fell out of her abdomen the day she was so supposed to have it removed by surgery.  Skin:    Findings: No lesion or rash.  Neurological:     General: No focal deficit present.     Mental Status: She is alert and oriented to person, place, and time.  Psychiatric:        Mood and Affect: Mood normal.        Speech: Speech normal.        Behavior: Behavior normal.        Thought Content: Thought content normal.        Judgment: Judgment normal.       No results found for any visits on 03/07/22.  Assessment & Plan     1. 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  3. Cushing's syndrome ([place])   4. History of Roux-en-Y gastric bypass   5. Left hip pain/vertebral compression fracture  Followed by pain clinic.  6. Protein-calorie malnutrition, unspecified severity ([place]) Encourage patient to continue to rehab.  7. Tobacco use disorder Patient has this plus substance abuse disorder.  Chantix written for her smoking. Continue NA support group 8.  Anxiety/depression Refill Seroquel while patient gets plugged in with psychiatry  No follow-ups on file.      I, Wilhemena Durie, MD, have reviewed all documentation for this visit. The documentation on 03/07/22 for the exam, diagnosis, procedures, and orders are all accurate and complete.    Sloan Takagi Cranford Mon, MD  [facility] [phone] (phone) [phone] (fax)  [place]

## 2022-04-17 LAB — FECAL OCCULT BLOOD, GUAIAC: Fecal Occult Blood: NEGATIVE

## 2022-06-08 ENCOUNTER — Telehealth: Payer: Self-pay | Admitting: Physician Assistant

## 2022-06-08 NOTE — Addendum Note (Signed)
Addended by: Barnie Mort on: 06/08/2022 04:33 PM   Modules accepted: Orders

## 2022-06-08 NOTE — Telephone Encounter (Signed)
Rx providers not in this office

## 2022-06-08 NOTE — Telephone Encounter (Signed)
Patient needs refills on Metoprolol 25 mg. And Atorvastatin 10 mg. Send to [facility] on [address]. [employer].

## 2022-06-11 ENCOUNTER — Ambulatory Visit: Payer: Medicaid Other | Admitting: Family Medicine

## 2022-06-21 ENCOUNTER — Other Ambulatory Visit: Payer: Self-pay | Admitting: Physician Assistant

## 2022-06-21 MED ORDER — ATORVASTATIN CALCIUM 10 MG PO TABS: 10.0000 mg | ORAL_TABLET | Freq: Every day | ORAL | 0 refills | Status: DC

## 2022-06-21 MED ORDER — METOPROLOL TARTRATE 25 MG PO TABS: 25.0000 mg | ORAL_TABLET | Freq: Two times a day (BID) | ORAL | 0 refills | Status: DC

## 2022-07-03 ENCOUNTER — Ambulatory Visit: Payer: Medicaid Other | Admitting: Physician Assistant

## 2022-07-03 ENCOUNTER — Encounter: Payer: Self-pay | Admitting: Physician Assistant

## 2022-07-03 VITALS — BP 145/97 | HR 86 | Wt 158.3 lb

## 2022-07-03 DIAGNOSIS — Z1231 Encounter for screening mammogram for malignant neoplasm of breast: Secondary | ICD-10-CM

## 2022-07-03 DIAGNOSIS — F17200 Nicotine dependence, unspecified, uncomplicated: Secondary | ICD-10-CM

## 2022-07-03 DIAGNOSIS — F319 Bipolar disorder, unspecified: Secondary | ICD-10-CM | POA: Insufficient documentation

## 2022-07-03 MED ORDER — VARENICLINE TARTRATE (STARTER) 0.5 MG X 11 & 1 MG X 42 PO TBPK: ORAL_TABLET | ORAL | 0 refills | Status: DC

## 2022-07-03 NOTE — Assessment & Plan Note (Signed)
Pt reports she previously used chantix successfully. Would like to try again.  Advised against smoking for better wound healing, lower risk of DVT/Stroke. Rx chantix.

## 2022-07-03 NOTE — Assessment & Plan Note (Signed)
Referred to psychiatry.

## 2022-07-03 NOTE — Progress Notes (Signed)
I,Sha'taria Tyson,acting as a Education administrator for [employer], PA-C.,have documented all relevant documentation on the behalf of Amanda Kirschner, PA-C,as directed by  Amanda Kirschner, PA-C while in the presence of Amanda Kirschner, PA-C.   Established patient visit   Patient: Amanda Hendricks   DOB: [birth year]   56 y.o. Female  MRN: [id number] Visit Date: 07/03/2022  Today's healthcare provider: [name], PA-C   Cc. Psychiatrist referral.  Subjective    HPI  Pt has a history of Bipolar Disorder and was seeing a psychiatrist. She has had multiple long hospital stays, several surgeries, and consequently has been displaced from her home in [place] and has moved in with her mother here in [place]. She is in need of a new psychiatrist.   Pt reports she started smoking again, that it helps with her anxiety. She reports using chantix before successfully. Denies side effects.   Medications: Outpatient Medications Prior to Visit  Medication Sig   acetaminophen (TYLENOL) 325 MG tablet Take 650 mg by mouth every 6 (six) hours as needed for mild pain or headache.   atorvastatin (LIPITOR) 10 MG tablet Take 1 tablet (10 mg total) by mouth at bedtime.   calcium carbonate (OS-CAL) 1250 (500 Ca) MG chewable tablet Chew 500 mg by mouth 3 (three) times daily as needed for heartburn.   divalproex (DEPAKOTE) 250 MG DR tablet Take 250 mg by mouth 2 (two) times daily.   DULoxetine (CYMBALTA) 30 MG capsule Take 1 capsule (30 mg total) by mouth daily.   metoprolol tartrate (LOPRESSOR) 25 MG tablet Take 1 tablet (25 mg total) by mouth in the morning and at bedtime.   Multiple Vitamins-Minerals (BARIATRIC MULTIVITAMINS/IRON) CAPS Take 1 tablet by mouth at bedtime.   ondansetron (ZOFRAN-ODT) 4 MG disintegrating tablet Allow 1-2 tablets to dissolve in your mouth every 8 hours as needed for nausea/vomiting   oxyCODONE HCl 15 MG TABA Take by mouth.   pantoprazole (PROTONIX) 40 MG tablet Take 40 mg by mouth 2  (two) times daily.   promethazine (PHENERGAN) 12.5 MG tablet Take 12.5 mg by mouth every 8 (eight) hours as needed for nausea or vomiting.   QUEtiapine (SEROQUEL) 300 MG tablet Take 1 tablet (300 mg total) by mouth at bedtime.   traZODone (DESYREL) 100 MG tablet Take 1 tablet (100 mg total) by mouth at bedtime.   triamcinolone cream (KENALOG) 0.1 % Apply 1 application topically 2 (two) times daily.   [DISCONTINUED] Varenicline Tartrate, Starter, (CHANTIX STARTING MONTH PAK) 0.5 MG X 11 & 1 MG X 42 TBPK 0.5 mg twice daily for 11 days then 1 mg twice daily   levothyroxine (SYNTHROID) 100 MCG tablet Take 100 mcg by mouth daily.   potassium chloride SA (KLOR-CON M20) 20 MEQ tablet Take 1 tablet (20 mEq total) by mouth 2 (two) times daily for 7 days.   [DISCONTINUED] ferrous sulfate 325 (65 FE) MG EC tablet Take 1 tablet by mouth daily with breakfast. (Patient not taking: Reported on 07/03/2022)   [DISCONTINUED] Magnesium Chloride 64 MG TBEC Take 535 mg by mouth in the morning and at bedtime. (Patient not taking: Reported on 07/03/2022)   [DISCONTINUED] polyethylene glycol powder (GLYCOLAX/MIRALAX) 17 GM/SCOOP powder Take 17 g by mouth daily as needed for mild constipation. (Patient not taking: Reported on 07/03/2022)   [DISCONTINUED] potassium chloride (KLOR-CON) 10 MEQ tablet Take 10 mEq by mouth daily. (Patient not taking: Reported on 07/03/2022)   [DISCONTINUED] senna-docusate (SENOKOT-S) 8.6-50 MG tablet Take 2 tablets by mouth  2 (two) times daily as needed for mild constipation. (Patient not taking: Reported on 07/03/2022)   No facility-administered medications prior to visit.     Objective    Blood pressure (!) 145/97, pulse 86, weight 158 lb 4.8 oz (71.8 kg), SpO2 99 %.   Physical Exam Vitals reviewed.  Constitutional:      Appearance: She is not ill-appearing.  HENT:     Head: Normocephalic.  Eyes:     Conjunctiva/sclera: Conjunctivae normal.  Cardiovascular:     Rate and Rhythm:  Normal rate.  Pulmonary:     Effort: Pulmonary effort is normal. No respiratory distress.  Neurological:     General: No focal deficit present.     Mental Status: She is alert and oriented to person, place, and time.  Psychiatric:        Mood and Affect: Mood normal.        Behavior: Behavior normal.      No results found for any visits on 07/03/22.  Assessment & Plan     Problem List Items Addressed This Visit       Other   Tobacco use disorder    Pt reports she previously used chantix successfully. Would like to try again.  Advised against smoking for better wound healing, lower risk of DVT/Stroke. Rx chantix.      Relevant Medications   Varenicline Tartrate, Starter, (CHANTIX STARTING MONTH PAK) 0.5 MG X 11 & 1 MG X 42 TBPK   Other Relevant Orders   AMB Referral to [facility]   Bipolar 1 disorder ([place]) - Primary    Referred to psychiatry.       Relevant Orders   Ambulatory referral to Psychiatry   AMB Referral to [place]   Other Visit Diagnoses     Breast cancer screening by mammogram       Relevant Orders   MM 3D SCREEN BREAST BILATERAL       Return in about 4 months (around 11/01/2022), or if symptoms worsen or fail to improve, for CPE.      I, Amanda Kirschner, PA-C have reviewed all documentation for this visit. The documentation on  07/03/2022  for the exam, diagnosis, procedures, and orders are all accurate and complete.  Amanda Kirschner, PA-C [facility] [address] [place], [place], [address] Office: [phone] Fax: [place]

## 2022-07-04 ENCOUNTER — Telehealth: Payer: Self-pay | Admitting: *Deleted

## 2022-07-04 NOTE — Progress Notes (Signed)
  Care Coordination  Outreach Note  07/04/2022 Name: Amanda Hendricks MRN: [id number] DOB: [birth year]   Care Coordination Outreach Attempts: An unsuccessful telephone outreach was attempted today to offer the patient information about available care coordination services as a benefit of their health plan.   Received referral   Follow Up Plan:  Additional outreach attempts will be made to offer the patient care coordination information and services.   Encounter Outcome:  No Answer  Julian Hy, [place] Direct Dial: [phone]

## 2022-07-10 NOTE — Progress Notes (Signed)
  Care Coordination   Note   07/10/2022 Name: Amanda Hendricks MRN: [id number] DOB: April 06, 1966  Amanda Hendricks is a 56 y.o. year old female who sees Thedore Mins, Ria Comment, [place] for primary care. I reached out to [name] by phone today to offer care coordination services.  [name] was given information about Care Coordination services today including:   The Care Coordination services include support from the care team which includes your Nurse Coordinator, Clinical Social Worker, or Pharmacist.  The Care Coordination team is here to help remove barriers to the health concerns and goals most important to you. Care Coordination services are voluntary, and the patient may decline or stop services at any time by request to their care team member.   Care Coordination Consent Status: Patient agreed to services and verbal consent obtained.   Follow up plan:  Telephone appointment with care coordination team member scheduled for:  07/16/2022  Encounter Outcome:  Pt. Scheduled from referral   Julian Hy, [place] Direct Dial: [phone]

## 2022-07-16 ENCOUNTER — Telehealth: Payer: Self-pay

## 2022-07-16 NOTE — Patient Outreach (Signed)
  Care Coordination   07/16/2022 Name: Amanda Hendricks MRN: [id number] DOB: 1965-12-21   Care Coordination Outreach Attempts:  An unsuccessful telephone outreach was attempted for a scheduled appointment today.  Follow Up Plan:  Additional outreach attempts will be made to offer the patient care coordination information and services.   Encounter Outcome:  No Answer   Care Coordination Interventions:  No, not indicated    Daneen Schick, BSW, CDP Social Worker, Certified Dementia Practitioner [place] [facility]  Care Coordination [phone]

## 2022-07-23 ENCOUNTER — Telehealth: Payer: Self-pay | Admitting: Physician Assistant

## 2022-07-23 ENCOUNTER — Telehealth: Payer: Self-pay | Admitting: *Deleted

## 2022-07-23 NOTE — Telephone Encounter (Signed)
[facility] faxed refill request for the following medications:   QUEtiapine (SEROQUEL) 300 MG tablet  .Patient stated that Amanda Hendricks is suppose to change her 300 MG dosage to 600 MG. Per patient     Please advise

## 2022-07-23 NOTE — Progress Notes (Unsigned)
  Care Coordination Note  07/23/2022 Name: Amanda Hendricks MRN: [id number] DOB: 1966-06-05  Amanda Hendricks is a 56 y.o. year old female who is a primary care patient of Thedore Mins, Delman Cheadle and is actively engaged with the care management team. I reached out to [name] by phone today to assist with re-scheduling an initial visit with the BSW  Follow up plan: Unsuccessful telephone outreach attempt made. A HIPAA compliant phone message was left for the patient providing contact information and requesting a return call.   Julian Hy, [place] Direct Dial: [phone]

## 2022-07-25 NOTE — Progress Notes (Signed)
  Care Coordination Note  07/25/2022 Name: TONYE TANCREDI MRN: [id number] DOB: 1966/04/15  SHAVAUGHN SEIDL is a 56 y.o. year old female who is a primary care patient of Thedore Mins, Delman Cheadle and is actively engaged with the care management team. I reached out to [name] by phone today to assist with re-scheduling an initial visit with the BSW  Follow up plan: Telephone appointment with care management team member scheduled for: 07/31/2022  Julian Hy, [place] Direct Dial: [phone]

## 2022-07-31 ENCOUNTER — Ambulatory Visit: Payer: Self-pay

## 2022-07-31 DIAGNOSIS — J189 Pneumonia, unspecified organism: Secondary | ICD-10-CM

## 2022-07-31 DIAGNOSIS — J9601 Acute respiratory failure with hypoxia: Secondary | ICD-10-CM

## 2022-07-31 NOTE — Patient Instructions (Signed)
Visit Information  Thank you for taking time to visit with me today. Please don't hesitate to contact me if I can be of assistance to you.   Following are the goals we discussed today:   Goals Addressed             This Visit's Progress    COMPLETED: Care Coordination Activities       Care Coordination Interventions: SW received referral to assist patient with disability application Determined the patient has previously applied for disability and was denied, patient is actively working with a lawyer to assist her with an appeal Encouraged patient to remain engaged with her lawyer regarding disability Discussed the patient has experienced a lot of hardship in the last year including loss of job, ended marriage, and loss of home. Patient is currently living with her mother and does not have any income Determined the patient has applied for Food and Nutrition services and has been approved for benefits but it is not enough and patient needs more assistance Discussed the patient recently applied for Medicaid and was approved for a Managed Medicaid plan. Patient currently has [facility] MM but plans to switch to [facility] January 1 as her specialists do not accept [place] the patient there are several benefits under Managed Medicaid plans. Patient especially interested in food resource and dental coverage Discussed plan for SW to refer the patient to the Managed Medicaid plan; patient agreeable Referral placed requested MM SW contact the patient to assist with ongoing care coordination needs         If you are experiencing a Mental Health or [place] or need someone to talk to, please call 1-800-273-TALK (toll free, 24 hour hotline) call 911  Patient verbalizes understanding of instructions and care plan provided today and agrees to view in [place]. Active MyChart status and patient understanding of how to access instructions and care plan via MyChart confirmed with  patient.     You will be contacted by the Managed Medicaid team to assist you with ongoing resource needs.  Daneen Schick, BSW, CDP Social Worker, Certified Dementia Practitioner [place] [facility]  Care Coordination [phone]

## 2022-07-31 NOTE — Patient Outreach (Signed)
  Care Coordination   Initial Visit Note   07/31/2022 Name: Amanda Hendricks MRN: [id number] DOB: Feb 12, 1966  Amanda Hendricks is a 56 y.o. year old female who sees Thedore Mins, Ria Comment, [place] for primary care. I spoke with  [name] by phone today.  What matters to the patients health and wellness today?  Resource education    Goals Addressed             This Visit's Progress    COMPLETED: Care Coordination Activities       Care Coordination Interventions: SW received referral to assist patient with disability application Determined the patient has previously applied for disability and was denied, patient is actively working with a lawyer to assist her with an appeal Encouraged patient to remain engaged with her lawyer regarding disability Discussed the patient has experienced a lot of hardship in the last year including loss of job, ended marriage, and loss of home. Patient is currently living with her mother and does not have any income Determined the patient has applied for Food and Nutrition services and has been approved for benefits but it is not enough and patient needs more assistance Discussed the patient recently applied for Medicaid and was approved for a Managed Medicaid plan. Patient currently has [facility] MM but plans to switch to [facility] January 1 as her specialists do not accept [place] the patient there are several benefits under Managed Medicaid plans. Patient especially interested in food resource and dental coverage Discussed plan for SW to refer the patient to the Managed Medicaid plan; patient agreeable Referral placed requested MM SW contact the patient to assist with ongoing care coordination needs         SDOH assessments and interventions completed:  Yes  SDOH Interventions Today    Flowsheet Row Most Recent Value  SDOH Interventions   Food Insecurity Interventions Other (Comment)  [recently approved for FNS]  Housing Interventions Intervention Not  Indicated  [recently moved in with mother]  Transportation Interventions Other (Comment)  [Referral to Managed Medicaid team]        Care Coordination Interventions:  Yes, provided   Follow up plan: Referral made to Managed Medicaid SW    Encounter Outcome:  Pt. Visit Completed   Daneen Schick, Arita Miss, CDP Social Worker, Certified Dementia Practitioner [facility] Management  Care Coordination [phone]

## 2022-08-07 ENCOUNTER — Ambulatory Visit: Payer: Medicaid Other

## 2022-08-17 ENCOUNTER — Other Ambulatory Visit: Payer: Medicaid Other

## 2022-08-17 NOTE — Patient Outreach (Signed)
Medicaid Managed Care Social Work Note  08/17/2022 Name:  Amanda Hendricks MRN:  [id number] DOB:  1966-04-30  Amanda Hendricks is an 57 y.o. year old female who is a primary patient of Amanda Hendricks, Amanda Hendricks, [place].  The [facility] Managed Care Coordination team was consulted for assistance with:  Food Insecurity  [name] was given information about Medicaid Managed Care Coordination team services today. Amanda Hendricks Patient agreed to services and verbal consent obtained.  Engaged with patient  for by telephone forinitial visit in response to referral for case management and/or care coordination services.   Assessments/Interventions:  Review of past medical history, allergies, medications, health status, including review of consultants reports, laboratory and other test data, was performed as part of comprehensive evaluation and provision of chronic care management services.  SDOH: (Social Determinant of Health) assessments and interventions performed: SDOH Interventions    Emigrant Coordination from 07/31/2022 in [place] Office Visit from 12/06/2021 in [place] Interventions    Food Insecurity Interventions Other (Hendricks)  [recently approved for FNS] --  Housing Interventions Intervention Not Indicated  [recently moved in with mother] --  Transportation Interventions Other (Hendricks)  [Referral to Managed Medicaid team] --  Depression Interventions/Treatment  -- Currently on Treatment     BSW completed a telephone outreach with patient. She stated she is currently living with her mother and receiving foodstamps, but they do not last the whole month. Patient requested some food pantries. BSW will email resources to Amanda Hendricks. No other resources are needed at this time.  Advanced Directives Status:  Not addressed in this encounter.  Care Plan                 Allergies  Allergen Reactions   Lisinopril Cough    Nsaids Other (See Comments)    Bleeding ulcers    Medications Reviewed Today     Reviewed by Amanda Kirschner, PA-C (Physician Assistant Certified) on 14/97/02 at [place] List Status: <None>   Medication Order Taking? Sig Documenting Provider Last Dose Status Informant  acetaminophen (TYLENOL) 325 MG tablet [id number] Yes Take 650 mg by mouth every 6 (six) hours as needed for mild pain or headache. [provider] Taking Active Self  atorvastatin (LIPITOR) 10 MG tablet [id number] Yes Take 1 tablet (10 mg total) by mouth at bedtime. Amanda Kirschner, PA-C Taking Active   calcium carbonate (OS-CAL) 1250 (500 Ca) MG chewable tablet [id number] Yes Chew 500 mg by mouth 3 (three) times daily as needed for heartburn. [provider] Taking Active Self  divalproex (DEPAKOTE) 250 MG DR tablet [id number] Yes Take 250 mg by mouth 2 (two) times daily. [provider] Taking Active Self  DULoxetine (CYMBALTA) 30 MG capsule [id number] Yes Take 1 capsule (30 mg total) by mouth daily. Amanda Hendricks., MD Taking Active   levothyroxine (SYNTHROID) 100 MCG tablet [id number]  Take 100 mcg by mouth daily. [provider]  Expired 02/21/22 2359 Self  metoprolol tartrate (LOPRESSOR) 25 MG tablet [id number] Yes Take 1 tablet (25 mg total) by mouth in the morning and at bedtime. Amanda Kirschner, PA-C Taking Active   Multiple Vitamins-Minerals (BARIATRIC MULTIVITAMINS/IRON) CAPS [id number] Yes Take 1 tablet by mouth at bedtime. [provider] Taking Active Self  ondansetron (ZOFRAN-ODT) 4 MG disintegrating tablet [id number] Yes Allow 1-2 tablets to dissolve in your mouth every 8 hours as needed for nausea/vomiting Amanda Kehr, MD Taking Active   oxyCODONE HCl  15 MG TABA [id number] Yes Take by mouth. [provider] Taking Active   pantoprazole (PROTONIX) 40 MG tablet [id number] Yes Take 40 mg by mouth 2 (two) times daily. [provider] Taking Active Self   potassium chloride SA (KLOR-CON M20) 20 MEQ tablet [id number]  Take 1 tablet (20 mEq total) by mouth 2 (two) times daily for 7 days. Amanda Kehr, MD  Expired 12/19/21 2359   promethazine (PHENERGAN) 12.5 MG tablet [id number] Yes Take 12.5 mg by mouth every 8 (eight) hours as needed for nausea or vomiting. [provider] Taking Active Self  QUEtiapine (SEROQUEL) 300 MG tablet [id number] Yes Take 1 tablet (300 mg total) by mouth at bedtime. Amanda Hendricks., MD Taking Active   traZODone (DESYREL) 100 MG tablet [id number] Yes Take 1 tablet (100 mg total) by mouth at bedtime. Amanda Hendricks., MD Taking Active   triamcinolone cream (KENALOG) 0.1 % [id number] Yes Apply 1 application topically 2 (two) times daily. [provider] Taking Active Self  Varenicline Tartrate, Starter, (CHANTIX STARTING MONTH PAK) 0.5 MG X 11 & 1 MG X 42 TBPK [id number]  0.5 mg twice daily for 11 days then 1 mg twice daily Amanda Kirschner, PA-C  Active             Patient Active Problem List   Diagnosis Date Noted   Bipolar 1 disorder ([place]) 07/03/2022   Acute respiratory failure with hypoxia ([place])    Community acquired pneumonia 08/28/2021   Aspiration pneumonia ([place]) 08/28/2021   Recent fracture of hip ([place]) 08/28/2021   Left hip pain    TPN-induced hepatitis    Acquired dilation of bile duct    Abdominal pain 08/20/2021   Abnormal finding on GI tract imaging    Elevated liver enzymes    History of Roux-en-Y gastric bypass    Epigastric abdominal pain    Ischemia, bowel ([place]) 07/17/2021   Marginal ulcers 06/20/2021   NASH (nonalcoholic steatohepatitis) 06/08/2021   OSA (obstructive sleep apnea) 01/20/2021   Protein-calorie malnutrition ([place]) 01/20/2021   SIADH (syndrome of inappropriate ADH production) ([place]) 12/30/2020   GAD (generalized anxiety disorder) 03/28/2017   Chronic bronchitis with emphysema 08/23/2016   Tobacco use disorder 08/23/2016   Cushing's syndrome ([place])  07/25/2016   Dyslipidemia 05/19/2013   HTN (hypertension) 05/19/2013    Conditions to be addressed/monitored per PCP order:   food resources  There are no care plans that you recently modified to display for this patient.   Follow up:  Patient agrees to Care Plan and Follow-up.  Plan: The Managed Medicaid care management team will reach out to the patient again over the next 30 days.  Date/time of next scheduled Social Work care management/care coordination outreach:  09/18/22  Mickel Fuchs, Arita Miss, [place] Medicaid Team  [phone]

## 2022-08-17 NOTE — Patient Instructions (Signed)
Visit Information  [name] was given information about Medicaid Managed Care team care coordination services as a part of their Healthy [facility] Medicaid benefit. [name] verbally consented to engagement with the [facility] Managed Care team.   If you are experiencing a medical emergency, please call 911 or report to your local emergency department or urgent care.   If you have a non-emergency medical problem during routine business hours, please contact your provider's office and ask to speak with a nurse.   For questions related to your Healthy [facility] health plan, please call: [phone] or visit the homepage here: [email or web address]  If you would like to schedule transportation through your Healthy [facility] plan, please call the following number at least 2 days in advance of your appointment: [phone]  For information about your ride after you set it up, call Ride Assist at [phone]. Use this number to activate a Will Call pickup, or if your transportation is late for a scheduled pickup. Use this number, too, if you need to make a change or cancel a previously scheduled reservation.  If you need transportation services right away, call [phone]. The after-hours call center is staffed 24 hours to handle ride assistance and urgent reservation requests (including discharges) 365 days a year. Urgent trips include sick visits, hospital discharge requests and life-sustaining treatment.  Call the [place] at [phone], at any time, 24 hours a day, 7 days a week. If you are in danger or need immediate medical attention call 911.  If you would like help to quit smoking, call 1-800-QUIT-NOW 571-769-0703) OR Espaol: 1-855-Djelo-Ya (4-097-353-2992) o para ms informacin haga clic aqu or Text READY to 200-400 to register via text  Amanda Hendricks - following are the goals we discussed in your visit today:   Goals  Addressed   None       Social Worker will follow up on 09/18/22.   Mickel Fuchs, BSW, [place] Managed Medicaid Team  [phone]   Following is a copy of your plan of care:  There are no care plans that you recently modified to display for this patient.

## 2022-08-20 DIAGNOSIS — K4030 Unilateral inguinal hernia, with obstruction, without gangrene, not specified as recurrent: Secondary | ICD-10-CM | POA: Diagnosis not present

## 2022-08-20 DIAGNOSIS — K4090 Unilateral inguinal hernia, without obstruction or gangrene, not specified as recurrent: Secondary | ICD-10-CM | POA: Diagnosis not present

## 2022-08-27 ENCOUNTER — Telehealth: Payer: Self-pay | Admitting: Physician Assistant

## 2022-08-27 ENCOUNTER — Other Ambulatory Visit: Payer: Self-pay | Admitting: Physician Assistant

## 2022-08-27 DIAGNOSIS — F319 Bipolar disorder, unspecified: Secondary | ICD-10-CM

## 2022-08-27 MED ORDER — QUETIAPINE FUMARATE 300 MG PO TABS: 300.0000 mg | ORAL_TABLET | Freq: Every day | ORAL | 0 refills | Status: AC

## 2022-08-27 NOTE — Telephone Encounter (Signed)
The patient isn't able to get the refills b/c they are under Dr. Marlan Palau name.  She is requesting we send in the remaining refills under a provider that is in the office.    Pt states she [facility] but told her she couldn't be seen due to her taking pain medication.  They referred her to [facility] - she went today and they stated the limit of patients they could see today was been reached.  She is going back on Wednesday.

## 2022-08-27 NOTE — Telephone Encounter (Signed)
Patient called stating she needed new rx for Trazodone 100 mg. and Quetiapine 300 mg. They cannot use the refills on her prescriptions because Dr. Rosanna Randy isnt covered by [facility] any longer.  Patient states that at her visit on 07/03/22 she was suppose to get new RX for these.   If she needs an appt, please let patient know.   [facility] [address] [employer].

## 2022-08-27 NOTE — Telephone Encounter (Signed)
[facility]--verified Trazadone is ready for pick up and QUEtiapine (SEROQUEL) 300 MG tablet -needed new rx

## 2022-08-27 NOTE — Telephone Encounter (Signed)
Please advise 

## 2022-08-28 NOTE — Telephone Encounter (Signed)
Notified pt medication sent to the pharmacy and referral.

## 2022-08-31 ENCOUNTER — Other Ambulatory Visit: Payer: Medicaid Other | Admitting: *Deleted

## 2022-08-31 DIAGNOSIS — F3132 Bipolar disorder, current episode depressed, moderate: Secondary | ICD-10-CM | POA: Diagnosis not present

## 2022-08-31 NOTE — Patient Instructions (Signed)
Visit Information  [name]  - as a part of your Medicaid benefit, you are eligible for care management and care coordination services at no cost or copay. I was unable to reach you by phone today but would be happy to help you with your health related needs. Please feel free to call me @ [phone].   A member of the Managed Medicaid care management team will reach out to you again over the next 14 days.   Lurena Joiner RN, BSN [place]  [employer] manager

## 2022-08-31 NOTE — Patient Outreach (Signed)
  Medicaid Managed Care   Unsuccessful Attempt Note   08/31/2022 Name: Amanda Hendricks MRN: [id number] DOB: 16-Mar-1966  Referred by: [name], PA-C Reason for referral : High Risk Managed Medicaid (Unsuccessful RNCM initial telephone outreach)   An unsuccessful telephone outreach was attempted today. The patient was referred to the case management team for assistance with care management and care coordination.    Follow Up Plan: A HIPAA compliant phone message was left for the patient providing contact information and requesting a return call. and The Managed Medicaid care management team will reach out to the patient again over the next 14 days.    Lurena Joiner RN, BSN [place]  [employer] manager

## 2022-09-05 DIAGNOSIS — Z48815 Encounter for surgical aftercare following surgery on the digestive system: Secondary | ICD-10-CM | POA: Diagnosis not present

## 2022-09-06 ENCOUNTER — Telehealth: Payer: Self-pay

## 2022-09-06 NOTE — Telephone Encounter (Signed)
..  Medicaid Managed Care   Unsuccessful Outreach Note  09/06/2022 Name: CAMILLE DRAGAN MRN: [id number] DOB: [birth year]  Referred by: [name], PA-C Reason for referral : Appointment   A second unsuccessful telephone outreach was attempted today. The patient was referred to the case management team for assistance with care management and care coordination.   Follow Up Plan: The care management team will reach out to the patient again over the next 7 days.   [place]

## 2022-09-17 ENCOUNTER — Other Ambulatory Visit: Payer: Medicaid Other

## 2022-09-17 NOTE — Patient Outreach (Signed)
  Medicaid Managed Care   Unsuccessful Outreach Note  09/17/2022 Name: Amanda Hendricks MRN: [id number] DOB: August 30, 1965  Referred by: [name], PA-C Reason for referral : High Risk Managed Medicaid (MM social work telephone outreach )   An unsuccessful telephone outreach was attempted today. The patient was referred to the case management team for assistance with care management and care coordination.   Follow Up Plan: A HIPAA compliant phone message was left for the patient providing contact information and requesting a return call.   Mickel Fuchs, BSW, [place] Managed Medicaid Team  [phone]

## 2022-09-17 NOTE — Patient Instructions (Signed)
  Medicaid Managed Care   Unsuccessful Outreach Note  09/17/2022 Name: Amanda Hendricks MRN: [id number] DOB: [birth year]  Referred by: [name], PA-C Reason for referral : High Risk Managed Medicaid (MM social work telephone outreach )   An unsuccessful telephone outreach was attempted today. The patient was referred to the case management team for assistance with care management and care coordination.   Follow Up Plan: A HIPAA compliant phone message was left for the patient providing contact information and requesting a return call.   Mickel Fuchs, BSW, [place] Managed Medicaid Team  [phone]

## 2022-09-20 DIAGNOSIS — F3132 Bipolar disorder, current episode depressed, moderate: Secondary | ICD-10-CM | POA: Diagnosis not present

## 2022-09-21 DIAGNOSIS — M791 Myalgia, unspecified site: Secondary | ICD-10-CM | POA: Diagnosis not present

## 2022-09-21 DIAGNOSIS — F319 Bipolar disorder, unspecified: Secondary | ICD-10-CM | POA: Diagnosis not present

## 2022-09-21 DIAGNOSIS — M79673 Pain in unspecified foot: Secondary | ICD-10-CM | POA: Diagnosis not present

## 2022-09-21 DIAGNOSIS — Z79899 Other long term (current) drug therapy: Secondary | ICD-10-CM | POA: Diagnosis not present

## 2022-09-21 DIAGNOSIS — M546 Pain in thoracic spine: Secondary | ICD-10-CM | POA: Diagnosis not present

## 2022-09-21 DIAGNOSIS — G894 Chronic pain syndrome: Secondary | ICD-10-CM | POA: Diagnosis not present

## 2022-09-21 DIAGNOSIS — G2401 Drug induced subacute dyskinesia: Secondary | ICD-10-CM | POA: Diagnosis not present

## 2022-09-21 DIAGNOSIS — R202 Paresthesia of skin: Secondary | ICD-10-CM | POA: Diagnosis not present

## 2022-09-21 DIAGNOSIS — M47816 Spondylosis without myelopathy or radiculopathy, lumbar region: Secondary | ICD-10-CM | POA: Diagnosis not present

## 2022-09-21 DIAGNOSIS — M25552 Pain in left hip: Secondary | ICD-10-CM | POA: Diagnosis not present

## 2022-09-21 DIAGNOSIS — I998 Other disorder of circulatory system: Secondary | ICD-10-CM | POA: Diagnosis not present

## 2022-09-21 DIAGNOSIS — M545 Low back pain, unspecified: Secondary | ICD-10-CM | POA: Diagnosis not present

## 2022-10-02 ENCOUNTER — Encounter: Payer: Self-pay | Admitting: *Deleted

## 2022-10-02 ENCOUNTER — Other Ambulatory Visit: Payer: Medicaid Other | Admitting: *Deleted

## 2022-10-02 NOTE — Patient Instructions (Signed)
Visit Information  [name] was given information about Medicaid Managed Care team care coordination services as a part of their Healthy [facility] Medicaid benefit. Amanda Hendricks verbally consented to engagement with the [facility] Managed Care team.   If you are experiencing a medical emergency, please call 911 or report to your local emergency department or urgent care.   If you have a non-emergency medical problem during routine business hours, please contact your provider's office and ask to speak with a nurse.   For questions related to your Healthy [facility] health plan, please call: [phone] or visit the homepage here: [email or web address]  If you would like to schedule transportation through your Healthy [facility] plan, please call the following number at least 2 days in advance of your appointment: [phone]  For information about your ride after you set it up, call Ride Assist at [phone]. Use this number to activate a Will Call pickup, or if your transportation is late for a scheduled pickup. Use this number, too, if you need to make a change or cancel a previously scheduled reservation.  If you need transportation services right away, call [phone]. The after-hours call center is staffed 24 hours to handle ride assistance and urgent reservation requests (including discharges) 365 days a year. Urgent trips include sick visits, hospital discharge requests and life-sustaining treatment.  Call the [name] at [phone], at any time, 24 hours a day, 7 days a week. If you are in danger or need immediate medical attention call 911.  If you would like help to quit smoking, call 1-800-QUIT-NOW 830-178-6695) OR Espaol: 1-855-Djelo-Ya HD:1601594) o para ms informacin haga clic aqu or Text READY to 200-400 to register via text  Amanda Hendricks,   Please see education materials related to pain management provided  by MyChart link.  [name]     Telephone follow up appointment with Managed Medicaid care management team member scheduled for:11/06/22 @ 2:30pm  Lurena Joiner RN, BSN [place] RN Care Coordinator   Following is a copy of your plan of care:  Care Plan : RN Care Manager Plan of Care  Updates made by [name], RN since 10/02/2022 12:00 AM     Problem: Health Management needs related to Chronic Pain      Long-Range Goal: Development of Plan of Care to address Health Management needs related to Chronic Pain   Start Date: 10/02/2022  Expected End Date: 01/30/2023  Note:   Current Barriers:  Chronic Disease Management support and education needs related to Chronic Pain  RNCM Clinical Goal(s):  Patient will verbalize understanding of plan for management of Chronic Pain as evidenced by Patient reports take all medications exactly as prescribed and will call provider for medication related questions as evidenced by patient reports    attend all scheduled medical appointments: PCP on 11/01/22 as evidenced by provider documentation in EMR        work with social worker to address Financial constraints related to Chronic Pain related to the management of Chronic Pain as evidenced by review of EMR and patient or Education officer, museum report     through collaboration with Consulting civil engineer, provider, and care team.   Interventions: Inter-disciplinary care team collaboration (see longitudinal plan of care) Evaluation of current  treatment plan related to  self management and patient's adherence to plan as established by provider   Pain:  (Status: New goal.) Long Term Goal  Pain assessment performed Medications reviewed Reviewed provider established  plan for pain management; Discussed importance of adherence to all scheduled medical appointments; Counseled on the importance of reporting any/all new or changed pain symptoms or management strategies to pain management provider; Advised patient to report to care team affect of pain on daily activities; Discussed use of relaxation techniques and/or diversional activities to assist with pain reduction (distraction, imagery, relaxation, massage, acupressure, TENS, heat, and cold application; Reviewed with patient prescribed pharmacological and nonpharmacological pain relief strategies; Advised patient to discuss Aquatic Therapy with provider; Rescheduled patient with BSW for assistance with financial resources  Patient Goals/Self-Care Activities: Attend all scheduled provider appointments Call provider office for new concerns or questions  Work with the social worker to address care coordination needs and will continue to work with the clinical team to address health care and disease management related needs Discuss aquatic therapy with Pain Management provider

## 2022-10-02 NOTE — Patient Outreach (Signed)
Medicaid Managed Care   Nurse Care Manager Note  10/02/2022 Name:  Amanda Hendricks MRN:  [id number] DOB:  [birth year]  Amanda Hendricks is an 57 y.o. year old female who is a primary patient of Amanda Hendricks, Amanda Hendricks, [place].  The [facility] Managed Care Hendricks team was consulted for assistance with:    Chronic Pain  [name] was given information about Medicaid Managed Care Hendricks team services today. [name] Patient agreed to services and verbal consent obtained.  Engaged with patient by telephone for initial visit in response to provider referral for case management and/or care Hendricks services.   Assessments/Interventions:  Review of past medical history, allergies, medications, health status, including review of consultants reports, laboratory and other test data, was performed as part of comprehensive evaluation and provision of chronic care management services.  SDOH (Social Determinants of Health) assessments and interventions performed: SDOH Interventions    Amanda Hendricks from 07/31/2022 in Amanda Hendricks Visit from 12/06/2021 in Amanda Hendricks  SDOH Interventions    Food Insecurity Interventions Other (Hendricks)  [recently approved for FNS] --  Housing Interventions Intervention Not Indicated  [recently moved in with mother] --  Transportation Interventions Other (Hendricks)  [Referral to Managed Medicaid team] --  Depression Interventions/Treatment  -- Currently on Treatment       Care Plan  Allergies  Allergen Reactions   Lisinopril Cough   Nsaids Other (See Comments)    Bleeding ulcers    Medications Reviewed Today     Reviewed by [name], RN (Registered Nurse) on 10/02/22 at 1521  Med List Status: <None>   Medication Order Taking? Sig Documenting Provider Last Dose Status Informant  acetaminophen (TYLENOL) 325 MG tablet [id number] Yes Take 650 mg by mouth every 6  (six) hours as needed for mild pain or headache. [provider] Taking Active Self  atorvastatin (LIPITOR) 10 MG tablet [id number] Yes Take 1 tablet (10 mg total) by mouth at bedtime. [name], PA-C Taking Active   calcium carbonate (OS-CAL) 1250 (500 Ca) MG chewable tablet [id number] No Chew 500 mg by mouth 3 (three) times daily as needed for heartburn.  Patient not taking: Reported on 10/02/2022   [provider] Not Taking Active Self  divalproex (DEPAKOTE) 250 MG DR tablet [id number] No Take 250 mg by mouth 2 (two) times daily.  Patient not taking: Reported on 10/02/2022   [provider] Not Taking Active Self  DULoxetine (CYMBALTA) 30 MG capsule [id number] No Take 1 capsule (30 mg total) by mouth daily.  Patient not taking: Reported on 10/02/2022   Amanda Post, MD Not Taking Active   levothyroxine (SYNTHROID) 100 MCG tablet [id number] Yes Take 100 mcg by mouth daily. [provider] Taking Active Self           Med Note (Crystallee Werden A   Tue Oct 02, 2022  3:18 PM) Taking 112 MCG daily  metoprolol tartrate (LOPRESSOR) 25 MG tablet [id number] Yes Take 1 tablet (25 mg total) by mouth in the morning and at bedtime. [name], PA-C Taking Active   Multiple Vitamins-Minerals (BARIATRIC MULTIVITAMINS/IRON) CAPS [id number] Yes Take 1 tablet by mouth at bedtime. [provider] Taking Active Self  ondansetron (ZOFRAN-ODT) 4 MG disintegrating tablet [id number] Yes Allow 1-2 tablets to dissolve in your mouth every 8 hours as needed for nausea/vomiting Hinda Kehr, MD Taking Active   oxyCODONE HCl 15 MG TABA [id number] Yes Take by mouth.  [provider] Taking Active   pantoprazole (PROTONIX) 40 MG tablet [id number] Yes Take 40 mg by mouth 2 (two) times daily. [provider] Taking Active Self  potassium chloride SA (KLOR-CON M20) 20 MEQ tablet [id number]  Take 1 tablet (20 mEq total) by mouth 2 (two) times daily for 7 days.  Hinda Kehr, MD  Expired 12/19/21 2359   promethazine (PHENERGAN) 12.5 MG tablet [id number] Yes Take 12.5 mg by mouth every 8 (eight) hours as needed for nausea or vomiting. [provider] Taking Active Self  QUEtiapine (SEROQUEL) 300 MG tablet [id number] Yes Take 1 tablet (300 mg total) by mouth at bedtime. [name], PA-C Taking Active   traZODone (DESYREL) 100 MG tablet [id number] No Take 1 tablet (100 mg total) by mouth at bedtime.  Patient not taking: Reported on 10/02/2022   Amanda Post, MD Not Taking Active   triamcinolone cream (KENALOG) 0.1 % [id number] No Apply 1 application topically 2 (two) times daily.  Patient not taking: Reported on 10/02/2022   [provider] Not Taking Active Self  Varenicline Tartrate, Starter, (CHANTIX STARTING MONTH PAK) 0.5 MG X 11 & 1 MG X 42 TBPK [id number] No 0.5 mg twice daily for 11 days then 1 mg twice daily  Patient not taking: Reported on 10/02/2022   [name], PA-C Not Taking Active             Patient Active Problem List   Diagnosis Date Noted   Bipolar 1 disorder ([place]) 07/03/2022   Acute respiratory failure with hypoxia ([place])    Community acquired pneumonia 08/28/2021   Aspiration pneumonia ([place]) 08/28/2021   Recent fracture of hip (Amanda Hendricks) 08/28/2021   Left hip pain    TPN-induced hepatitis    Acquired dilation of bile duct    Abdominal pain 08/20/2021   Abnormal finding on GI tract imaging    Elevated liver enzymes    History of Roux-en-Y gastric bypass    Epigastric abdominal pain    Ischemia, bowel ([place]) 07/17/2021   Marginal ulcers 06/20/2021   NASH (nonalcoholic steatohepatitis) 06/08/2021   OSA (obstructive sleep apnea) 01/20/2021   Protein-calorie malnutrition (Amanda Hendricks) 01/20/2021   SIADH (syndrome of inappropriate ADH production) (Amanda Hendricks) 12/30/2020   GAD (generalized anxiety disorder) 03/28/2017   Chronic bronchitis with emphysema 08/23/2016   Tobacco use disorder 08/23/2016   Cushing's  syndrome (Amanda Hendricks) 07/25/2016   Dyslipidemia 05/19/2013   HTN (hypertension) 05/19/2013    Conditions to be addressed/monitored per PCP order:   Chronic Pain  Care Plan : [place] of Care  Updates made by [name], RN since 10/02/2022 12:00 AM     Problem: Health Management needs related to Chronic Pain      Long-Range Goal: Development of Plan of Care to address Health Management needs related to Chronic Pain   Start Date: 10/02/2022  Expected End Date: 01/30/2023  Note:   Current Barriers:  Chronic Disease Management support and education needs related to Chronic Pain  RNCM Clinical Goal(s):  Patient will verbalize understanding of plan for management of Chronic Pain as evidenced by Patient reports take all medications exactly as prescribed and will call provider for medication related questions as evidenced by patient reports    attend all scheduled medical appointments: PCP on 11/01/22 as evidenced by provider documentation in EMR        work with social worker to address Financial constraints related to Chronic Pain related to the management of Chronic Pain as evidenced  by review of EMR and patient or social worker report     through collaboration with Consulting civil engineer, provider, and care team.   Interventions: Inter-disciplinary care team collaboration (see longitudinal plan of care) Evaluation of current treatment plan related to  self management and patient's adherence to plan as established by provider   Pain:  (Status: Amanda goal.) Long Term Goal  Pain assessment performed Medications reviewed Reviewed provider established plan for pain management; Discussed importance of adherence to all scheduled medical appointments; Counseled on the importance of reporting any/all Amanda or changed pain symptoms or management strategies to pain management provider; Advised patient to report to care team affect of pain on daily activities; Discussed use of relaxation  techniques and/or diversional activities to assist with pain reduction (distraction, imagery, relaxation, massage, acupressure, TENS, heat, and cold application; Reviewed with patient prescribed pharmacological and nonpharmacological pain relief strategies; Advised patient to discuss Aquatic Therapy with provider; Rescheduled patient with BSW for assistance with financial resources  Patient Goals/Self-Care Activities: Attend all scheduled provider appointments Call provider Hendricks for Amanda concerns or questions  Work with the social worker to address care Hendricks needs and will continue to work with the clinical team to address health care and disease management related needs Discuss aquatic therapy with Pain Management provider       Follow Up:  Patient agrees to Care Plan and Follow-up.  Plan: The Managed Medicaid care management team will reach out to the patient again over the next 30 days.  Date/time of next scheduled RN care management/care Hendricks outreach:  11/06/22 at 2:30pm  Lurena Joiner RN, [place] RN Care Coordinator

## 2022-10-05 ENCOUNTER — Other Ambulatory Visit: Payer: Medicaid Other

## 2022-10-05 NOTE — Patient Outreach (Signed)
Medicaid Managed Care Social Work Note  10/05/2022 Name:  Amanda Hendricks MRN:  [id number] DOB:  July 26, 1966  Amanda Hendricks is an 57 y.o. year old female who is a primary patient of Thedore Hendricks, Ria Comment, [place].  The Medicaid Managed Care Coordination team was consulted for assistance with:  Community Resources   Ms. Lout was given information about Medicaid Managed Care Coordination team services today. [name] Patient agreed to services and verbal consent obtained.  Engaged with patient  for by telephone forfollow up visit in response to referral for case management and/or care coordination services.   Assessments/Interventions:  Review of past medical history, allergies, medications, health status, including review of consultants reports, laboratory and other test data, was performed as part of comprehensive evaluation and provision of chronic care management services.  SDOH: (Social Determinant of Health) assessments and interventions performed: SDOH Interventions    [place] Coordination from 07/31/2022 in [place] Office Visit from 12/06/2021 in [place]  SDOH Interventions    Food Insecurity Interventions Other (Comment)  [recently approved for FNS] --  Housing Interventions Intervention Not Indicated  [recently moved in with mother] --  Transportation Interventions Other (Comment)  [Referral to Managed Medicaid team] --  Depression Interventions/Treatment  -- Currently on Treatment     BSW completed a telephone outreach with patient. 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 Patient states no resources are needed at this time.  Advanced Directives Status:  Not addressed in this encounter.  Care Plan                 Allergies   Allergen Reactions   Lisinopril Cough   Nsaids Other (See Comments)    Bleeding ulcers    Medications Reviewed Today     Reviewed by [name], RN (Registered Nurse) on 10/02/22 at 1521  Med List Status: <None>   Medication Order Taking? Sig Documenting Provider Last Dose Status Informant  acetaminophen (TYLENOL) 325 MG tablet [id number] Yes Take 650 mg by mouth every 6 (six) hours as needed for mild pain or headache. [provider] Taking Active Self  atorvastatin (LIPITOR) 10 MG tablet [id number] Yes Take 1 tablet (10 mg total) by mouth at bedtime. [name], PA-C Taking Active   calcium carbonate (OS-CAL) 1250 (500 Ca) MG chewable tablet [id number] No Chew 500 mg by mouth 3 (three) times daily as needed for heartburn.  Patient not taking: Reported on 10/02/2022   [provider] Not Taking Active Self  divalproex (DEPAKOTE) 250 MG DR tablet [id number] No Take 250 mg by mouth 2 (two) times daily.  Patient not taking: Reported on 10/02/2022   [provider] Not Taking Active Self  DULoxetine (CYMBALTA) 30 MG capsule [id number] No Take 1 capsule (30 mg total) by mouth daily.  Patient not taking: Reported on 10/02/2022   Eulas Post, MD Not Taking Active   levothyroxine (SYNTHROID) 100 MCG tablet [id number] Yes Take 100 mcg by mouth daily. [provider] Taking Active Self           Med Note (ROBB, MELANIE A   Tue Oct 02, 2022  3:18 PM) Taking 112 MCG daily  metoprolol tartrate (LOPRESSOR) 25 MG tablet [id number] Yes Take 1 tablet (25  mg total) by mouth in the morning and at bedtime. [name], PA-C Taking Active   Multiple Vitamins-Minerals (BARIATRIC MULTIVITAMINS/IRON) CAPS [id number] Yes Take 1 tablet by mouth at bedtime. [provider] Taking Active Self  ondansetron (ZOFRAN-ODT) 4 MG disintegrating tablet [id number] Yes Allow 1-2 tablets to dissolve in your mouth every 8 hours as needed for nausea/vomiting Hinda Kehr,  MD Taking Active   oxyCODONE HCl 15 MG TABA [id number] Yes Take by mouth. [provider] Taking Active   pantoprazole (PROTONIX) 40 MG tablet [id number] Yes Take 40 mg by mouth 2 (two) times daily. [provider] Taking Active Self  potassium chloride SA (KLOR-CON M20) 20 MEQ tablet [id number]  Take 1 tablet (20 mEq total) by mouth 2 (two) times daily for 7 days. Hinda Kehr, MD  Expired 12/19/21 2359   promethazine (PHENERGAN) 12.5 MG tablet [id number] Yes Take 12.5 mg by mouth every 8 (eight) hours as needed for nausea or vomiting. [provider] Taking Active Self  QUEtiapine (SEROQUEL) 300 MG tablet [id number] Yes Take 1 tablet (300 mg total) by mouth at bedtime. [name], PA-C Taking Active   traZODone (DESYREL) 100 MG tablet [id number] No Take 1 tablet (100 mg total) by mouth at bedtime.  Patient not taking: Reported on 10/02/2022   Eulas Post, MD Not Taking Active   triamcinolone cream (KENALOG) 0.1 % [id number] No Apply 1 application topically 2 (two) times daily.  Patient not taking: Reported on 10/02/2022   [provider] Not Taking Active Self  Varenicline Tartrate, Starter, (CHANTIX STARTING MONTH PAK) 0.5 MG X 11 & 1 MG X 42 TBPK [id number] No 0.5 mg twice daily for 11 days then 1 mg twice daily  Patient not taking: Reported on 10/02/2022   [name], PA-C Not Taking Active             Patient Active Problem List   Diagnosis Date Noted   Bipolar 1 disorder ([place]) 07/03/2022   Acute respiratory failure with hypoxia ([place])    Community acquired pneumonia 08/28/2021   Aspiration pneumonia ([place]) 08/28/2021   Recent fracture of hip ([place]) 08/28/2021   Left hip pain    TPN-induced hepatitis    Acquired dilation of bile duct    Abdominal pain 08/20/2021   Abnormal finding on GI tract imaging    Elevated liver enzymes    History of Roux-en-Y gastric bypass    Epigastric abdominal pain    Ischemia, bowel ([place]) 07/17/2021    Marginal ulcers 06/20/2021   NASH (nonalcoholic steatohepatitis) 06/08/2021   OSA (obstructive sleep apnea) 01/20/2021   Protein-calorie malnutrition ([place]) 01/20/2021   SIADH (syndrome of inappropriate ADH production) ([place]) 12/30/2020   GAD (generalized anxiety disorder) 03/28/2017   Chronic bronchitis with emphysema 08/23/2016   Tobacco use disorder 08/23/2016   Cushing's syndrome ([place]) 07/25/2016   Dyslipidemia 05/19/2013   HTN (hypertension) 05/19/2013    Conditions to be addressed/monitored per PCP order:   community resources  Care Plan : [place] of Care  Updates made by Ethelda Chick since 10/05/2022 12:00 AM     Problem: Health Management needs related to Chronic Pain      Long-Range Goal: Development of Plan of Care to address Health Management needs related to Chronic Pain   Start Date: 10/02/2022  Expected End Date: 01/30/2023  Note:   Current Barriers:  Chronic Disease Management support and education needs related to Chronic Pain  RNCM Clinical Goal(s):  Patient will verbalize understanding  of plan for management of Chronic Pain as evidenced by Patient reports take all medications exactly as prescribed and will call provider for medication related questions as evidenced by patient reports    attend all scheduled medical appointments: PCP on 11/01/22 as evidenced by provider documentation in EMR        work with social worker to address Financial constraints related to Chronic Pain related to the management of Chronic Pain as evidenced by review of EMR and patient or Education officer, museum report     through collaboration with Consulting civil engineer, provider, and care team.   Interventions: Inter-disciplinary care team collaboration (see longitudinal plan of care) Evaluation of current treatment plan related to  self management and patient's adherence to plan as established by provider BSW completed a telephone outreach with patient. 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 Patient states no resources are needed at this time.  Pain:  (Status: New goal.) Long Term Goal  Pain assessment performed Medications reviewed Reviewed provider established plan for pain management; Discussed importance of adherence to all scheduled medical appointments; Counseled on the importance of reporting any/all new or changed pain symptoms or management strategies to pain management provider; Advised patient to report to care team affect of pain on daily activities; Discussed use of relaxation techniques and/or diversional activities to assist with pain reduction (distraction, imagery, relaxation, massage, acupressure, TENS, heat, and cold application; Reviewed with patient prescribed pharmacological and nonpharmacological pain relief strategies; Advised patient to discuss Aquatic Therapy with provider; Rescheduled patient with BSW for assistance with financial resources  Patient Goals/Self-Care Activities: Attend all scheduled provider appointments Call provider office for new concerns or questions  Work with the social worker to address care coordination needs and will continue to work with the clinical team to address health care and disease management related needs Discuss aquatic therapy with Pain Management provider       Follow up:  Patient agrees to Care Plan and Follow-up.  Plan: The  Patient has been provided with contact information for the Managed Medicaid care management team and has been advised to call with any health related questions or concerns.   Mickel Fuchs, BSW, [place] Managed Medicaid Team  [phone]

## 2022-10-05 NOTE — Patient Instructions (Signed)
Visit Information  Amanda Hendricks was given information about Medicaid Managed Care team care coordination services as a part of their Healthy [facility] Medicaid benefit. Amanda Hendricks verbally consented to engagement with the [facility] Managed Care team.   If you are experiencing a medical emergency, please call 911 or report to your local emergency department or urgent care.   If you have a non-emergency medical problem during routine business hours, please contact your provider's office and ask to speak with a nurse.   For questions related to your Healthy [facility] health plan, please call: [phone] or visit the homepage here: [email or web address]  If you would like to schedule transportation through your Healthy [facility] plan, please call the following number at least 2 days in advance of your appointment: [phone]  For information about your ride after you set it up, call Ride Assist at [phone]. Use this number to activate a Will Call pickup, or if your transportation is late for a scheduled pickup. Use this number, too, if you need to make a change or cancel a previously scheduled reservation.  If you need transportation services right away, call [phone]. The after-hours call center is staffed 24 hours to handle ride assistance and urgent reservation requests (including discharges) 365 days a year. Urgent trips include sick visits, hospital discharge requests and life-sustaining treatment.  Call [place] at [phone], at any time, 24 hours a day, 7 days a week. If you are in danger or need immediate medical attention call 911.  If you would like help to quit smoking, call 1-800-QUIT-NOW 443-499-3222) OR Espaol: 1-855-Djelo-Ya QO:409462) o para ms informacin haga clic aqu or Text READY to 200-400 to register via text  Amanda Hendricks - following are the goals we discussed in your visit today:   Goals  Addressed   None      The  Patient                                              has been provided with contact information for the Managed Medicaid care management team and has been advised to call with any health related questions or concerns.   Amanda Hendricks, BSW, [place]  High Risk Managed Medicaid Team  [phone]   Following is a copy of your plan of care:  Care Plan : [place] of Care  Updates made by Ethelda Chick since 10/05/2022 12:00 AM     Problem: Health Management needs related to Chronic Pain      Long-Range Goal: Development of Plan of Care to address Health Management needs related to Chronic Pain   Start Date: 10/02/2022  Expected End Date: 01/30/2023  Note:   Current Barriers:  Chronic Disease Management support and education needs related to Chronic Pain  RNCM Clinical Goal(s):  Patient will verbalize understanding of plan for management of Chronic Pain as evidenced by Patient reports take all medications exactly as prescribed and will call provider for medication related questions as evidenced by patient reports    attend all scheduled medical appointments: PCP on 11/01/22 as evidenced by provider documentation in EMR        work with social worker to address Financial constraints related to Chronic Pain related to the management of Chronic Pain as evidenced by review of EMR and  patient or Education officer, museum report     through collaboration with Consulting civil engineer, provider, and care team.   Interventions: Inter-disciplinary care team collaboration (see longitudinal plan of care) Evaluation of current treatment plan related to  self management and patient's adherence to plan as established by provider BSW completed a telephone outreach with patient. 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 Patient states no resources are needed at this time.  Pain:  (Status: New goal.) Long Term Goal  Pain assessment performed Medications reviewed Reviewed provider established plan for pain management; Discussed importance of adherence to all scheduled medical appointments; Counseled on the importance of reporting any/all new or changed pain symptoms or management strategies to pain management provider; Advised patient to report to care team affect of pain on daily activities; Discussed use of relaxation techniques and/or diversional activities to assist with pain reduction (distraction, imagery, relaxation, massage, acupressure, TENS, heat, and cold application; Reviewed with patient prescribed pharmacological and nonpharmacological pain relief strategies; Advised patient to discuss Aquatic Therapy with provider; Rescheduled patient with BSW for assistance with financial resources  Patient Goals/Self-Care Activities: Attend all scheduled provider appointments Call provider office for new concerns or questions  Work with the social worker to address care coordination needs and will continue to work with the clinical team to address health care and disease management related needs Discuss aquatic therapy with Pain Management provider

## 2022-10-16 IMAGING — CT CT ABD-PELV W/ CM
2 of 5 series · 15 of 46 positions shown, 17 images · IV contrast (agent unspecified)
Comparison: 08/20/2021

CLINICAL DATA: History of recent bariatric surgery reversal two
months ago with abdominal pain, initial encounter

EXAM:
CT ABDOMEN AND PELVIS WITH CONTRAST
TECHNIQUE: Multidetector CT imaging of the abdomen and pelvis was performed
using the standard protocol following bolus administration of
intravenous contrast.

[Series 2: abdomen 5.0 (person_name) · axial · 0.76mm/px · z∈[-1024,-609]mm · 12 of 97 slices shown, 14 images]
[im 7/97  soft-tissue]
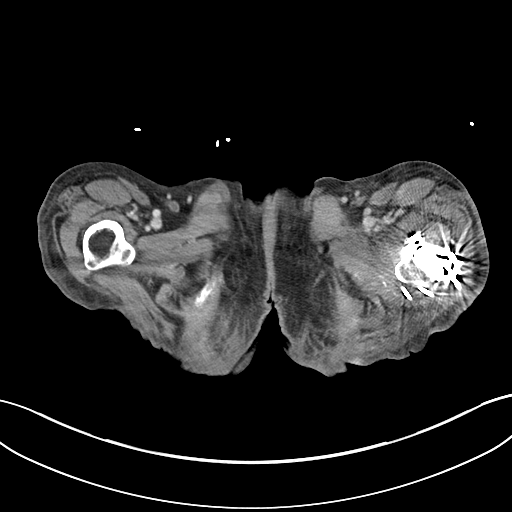
[im 7/97  bone]
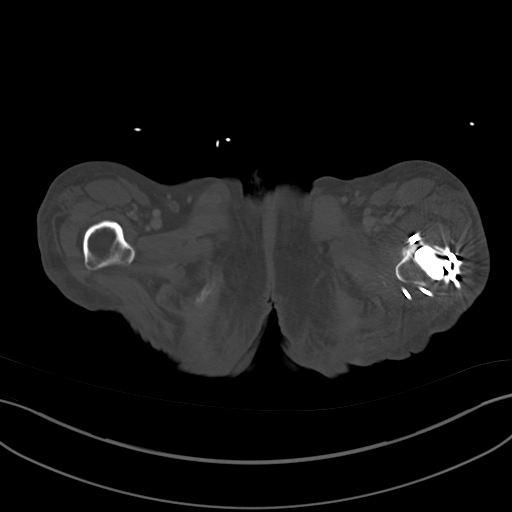
[im 13/97  soft-tissue]
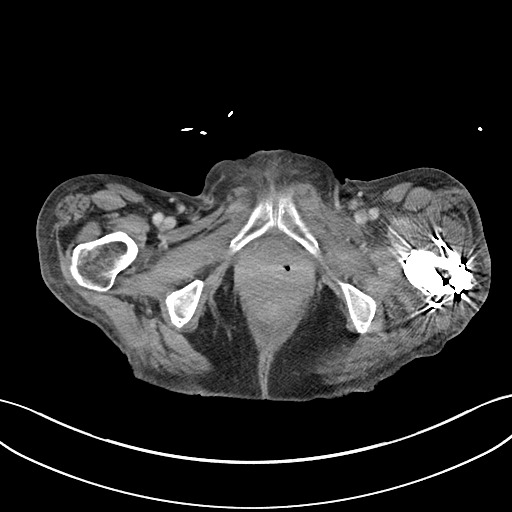
[im 20/97  soft-tissue]
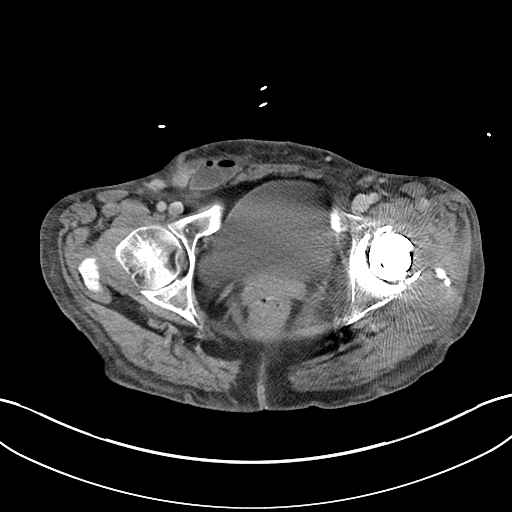
[im 33/97  soft-tissue]
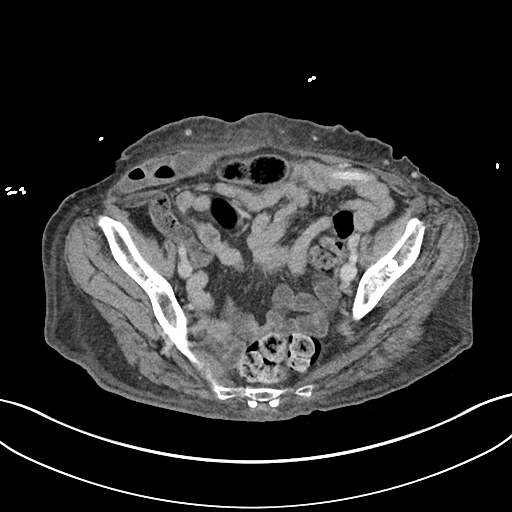
[im 39/97  soft-tissue]
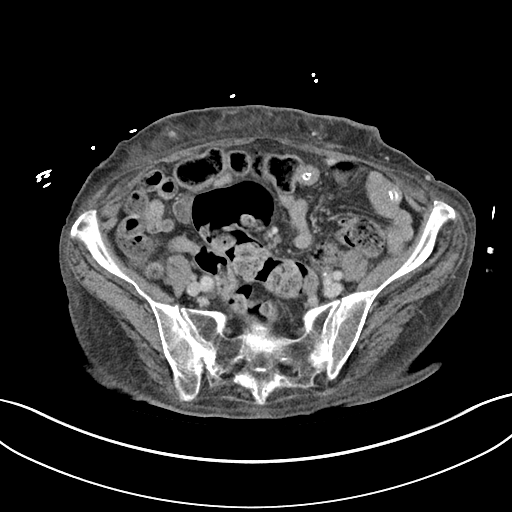
[im 45/97  soft-tissue]
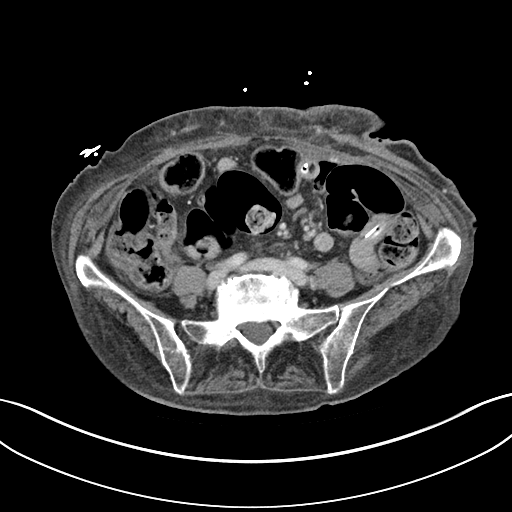
[im 52/97  soft-tissue]
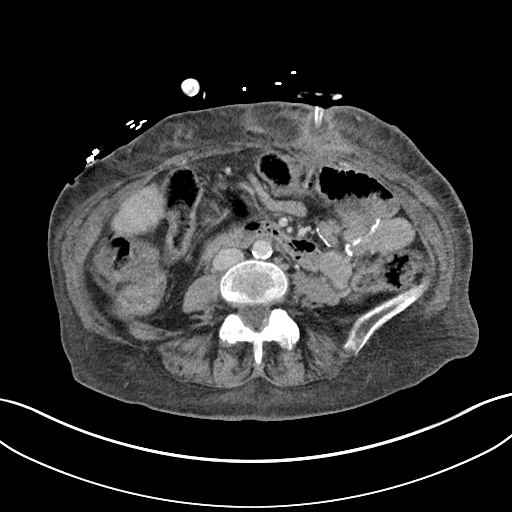
[im 58/97  soft-tissue]
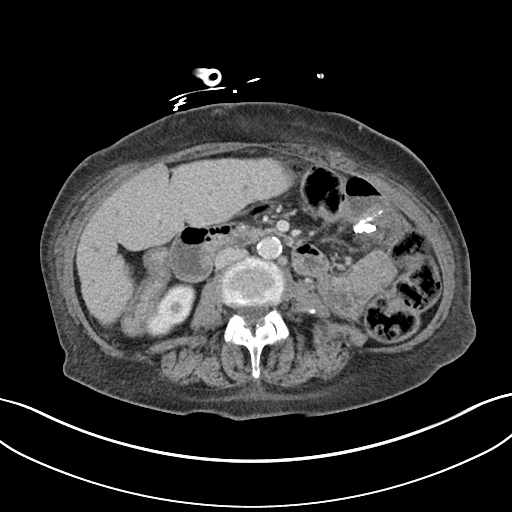
[im 65/97  soft-tissue]
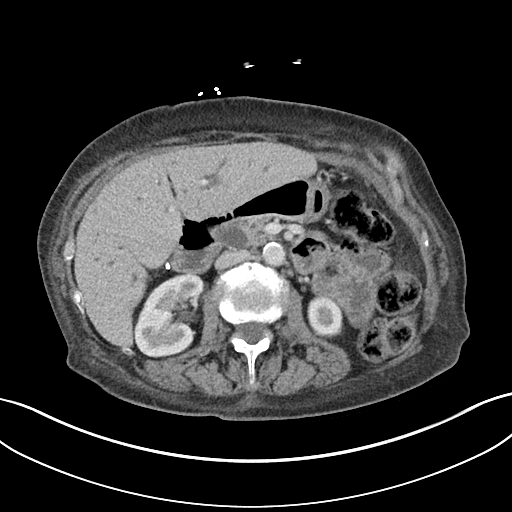
[im 65/97  bone]
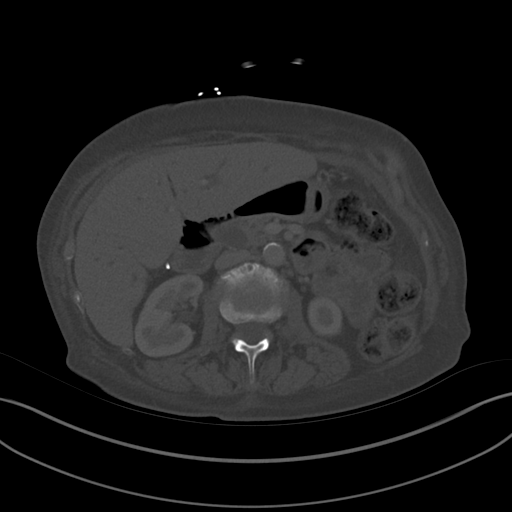
[im 77/97  soft-tissue]
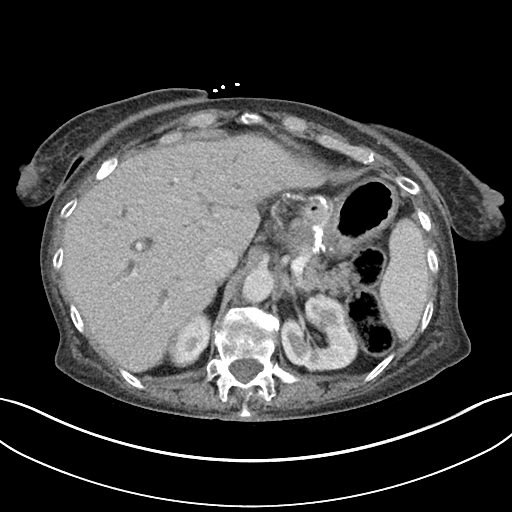
[im 84/97  soft-tissue]
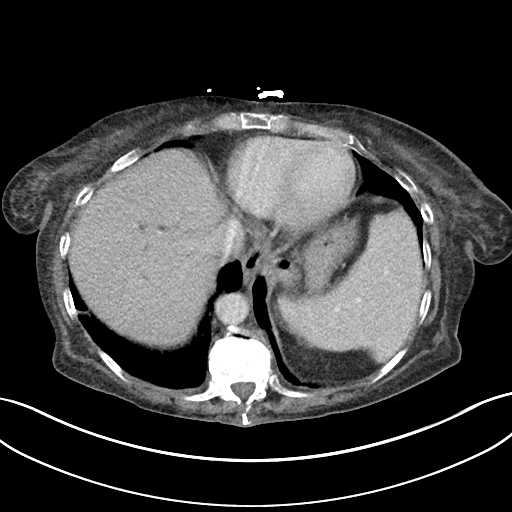
[im 90/97  soft-tissue]
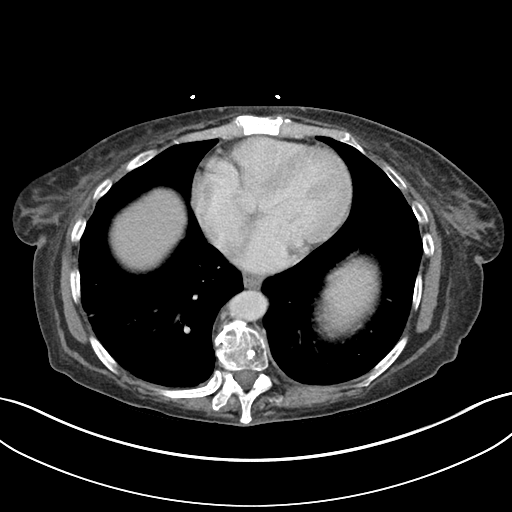

[Series 5: abdomen 3.0 (person_name) · coronal · 0.77mm/px · 3 of 86 slices shown]
[im 29/86  soft-tissue]
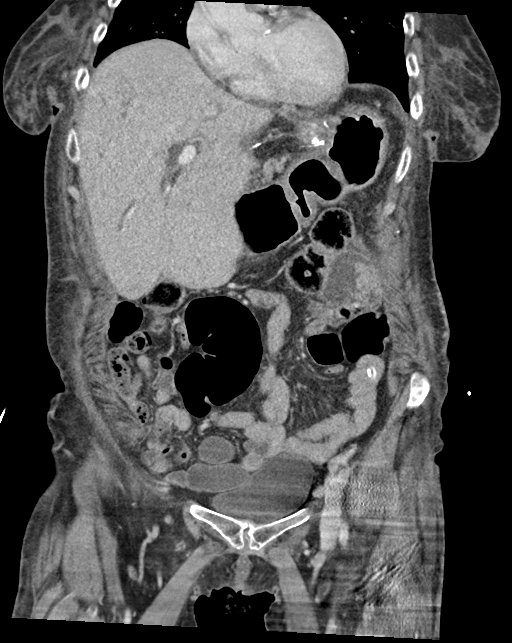
[im 38/86  soft-tissue]
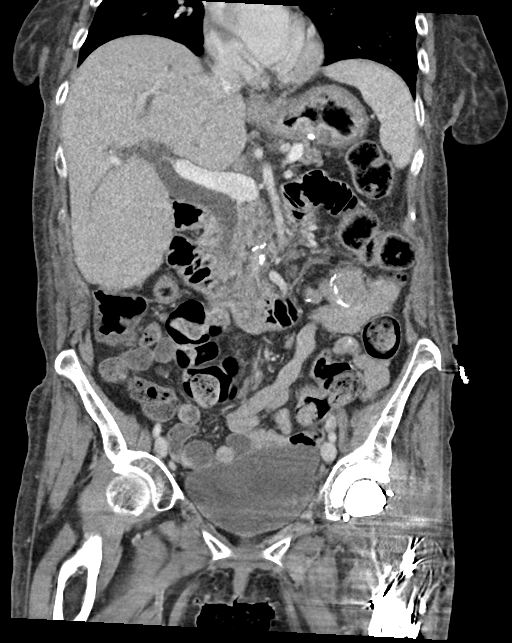
[im 48/86  soft-tissue]
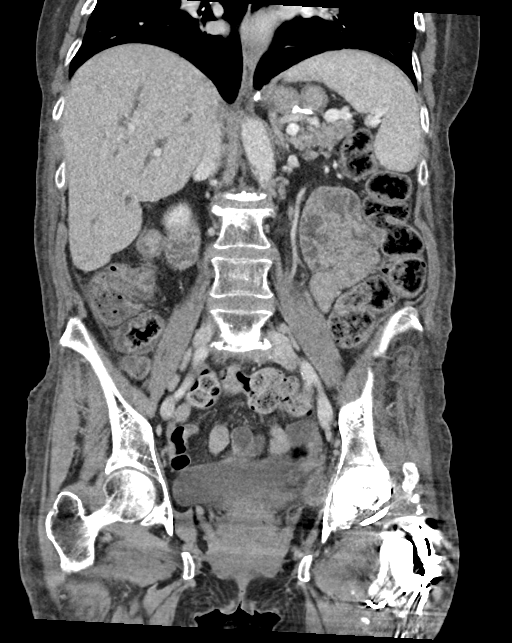

[15 of 46 positions shown; findings below may reference images not displayed]

RADIATION DOSE REDUCTION: This exam was performed according to the
departmental dose-optimization program which includes automated
exposure control, adjustment of the mA and/or kV according to
patient size and/or use of iterative reconstruction technique.

CONTRAST:  75mL OMNIPAQUE IOHEXOL 300 MG/ML  SOLN
FINDINGS: Lower chest: Mild scarring is noted in the bases bilaterally.

Hepatobiliary: Gallbladder has been surgically removed. Biliary
ductal dilatation is noted similar to that seen on prior exam. No
choledocholithiasis is seen.

Pancreas: Unremarkable. No pancreatic ductal dilatation or
surrounding inflammatory changes.

Spleen: Normal in size without focal abnormality.

Adrenals/Urinary Tract: Adrenal glands are stable in appearance from
the prior exam. Some mild hypertrophy on the left is noted. Kidneys
demonstrate a normal enhancement pattern. No renal calculi or
obstructive changes are noted. Normal excretion is noted on delayed
images. The bladder is well distended.

Stomach/Bowel: Fecal material is noted throughout the colon. No
obstructive or inflammatory changes are seen. Right anterior
abdominal wall hernia is noted with small bowel loops within. No
obstructive changes are noted. The appendix is not well visualized
and may have been surgically removed. Small bowel shows evidence of
a jejunostomy catheter in the left mid abdomen. This is new from the
prior exam. The catheter has withdrawn somewhat with the retention
balloon identified within the subcutaneous fat. Some inflammatory
changes as well as air in fluid are noted in the subcutaneous
tissues adjacent to the catheter which may represent early changes
of abscess. The stomach shows postsurgical changes consistent with
the given clinical history.

Vascular/Lymphatic: Aortic atherosclerosis. No enlarged abdominal or
pelvic lymph nodes.

Reproductive: Status post hysterectomy. No adnexal masses.

Other: No abdominal wall hernia or abnormality. No abdominopelvic
ascites.

Musculoskeletal: Postsurgical changes in the left hip are noted.
Degenerative changes of lumbar spine are seen. Compression
deformities at T10 and T11 are noted with anterior fusion. The
overall appearance is stable from the prior exam.
IMPRESSION: Changes consistent with incidental pulmonary embolism in the right
lower lobe. CTA of the chest may be helpful for further evaluation.

Postsurgical changes consistent with the given clinical history of
gastric bypass and subsequent reversal.

Jejunostomy catheter in place although the retention balloon is
identified within the subcutaneous tissues. Repositioning is
recommended. Some fluid and air is noted in the subcutaneous tissues
adjacent to the catheter. This likely represents a subcutaneous
abscess and appears to extend inferiorly to areas of scarring
related to prior surgery and possible fistulization.

Critical Value/emergent results were called by telephone at the time
of interpretation on 12/12/2021 at [DATE] to Dr. WOLSVAGEN HU , who
verbally acknowledged these results.

## 2022-10-18 DIAGNOSIS — F3132 Bipolar disorder, current episode depressed, moderate: Secondary | ICD-10-CM | POA: Diagnosis not present

## 2022-10-19 ENCOUNTER — Ambulatory Visit (INDEPENDENT_AMBULATORY_CARE_PROVIDER_SITE_OTHER): Payer: Medicaid Other | Admitting: Physician Assistant

## 2022-10-19 ENCOUNTER — Encounter: Payer: Self-pay | Admitting: Physician Assistant

## 2022-10-19 VITALS — BP 152/107 | HR 103 | Temp 99.1°F | Wt 155.3 lb

## 2022-10-19 DIAGNOSIS — K219 Gastro-esophageal reflux disease without esophagitis: Secondary | ICD-10-CM

## 2022-10-19 DIAGNOSIS — Z20822 Contact with and (suspected) exposure to covid-19: Secondary | ICD-10-CM | POA: Diagnosis not present

## 2022-10-19 DIAGNOSIS — I10 Essential (primary) hypertension: Secondary | ICD-10-CM | POA: Diagnosis not present

## 2022-10-19 DIAGNOSIS — U071 COVID-19: Secondary | ICD-10-CM | POA: Diagnosis not present

## 2022-10-19 LAB — POC COVID19 BINAXNOW: SARS Coronavirus 2 Ag: POSITIVE — AB

## 2022-10-19 MED ORDER — KETOROLAC TROMETHAMINE 10 MG PO TABS: 10.0000 mg | ORAL_TABLET | Freq: Four times a day (QID) | ORAL | 0 refills | Status: DC | PRN

## 2022-10-19 MED ORDER — MOLNUPIRAVIR EUA 200MG CAPSULE: 4.0000 | ORAL_CAPSULE | Freq: Two times a day (BID) | ORAL | 0 refills | Status: DC

## 2022-10-19 MED ORDER — MOLNUPIRAVIR EUA 200MG CAPSULE: 4.0000 | ORAL_CAPSULE | Freq: Two times a day (BID) | ORAL | 0 refills | Status: AC

## 2022-10-19 MED ORDER — PANTOPRAZOLE SODIUM 40 MG PO TBEC: 40.0000 mg | DELAYED_RELEASE_TABLET | Freq: Every day | ORAL | 1 refills | Status: DC

## 2022-10-19 NOTE — Progress Notes (Unsigned)
     I,Sha'taria Tyson,acting as a Education administrator for [employer], PA-C.,have documented all relevant documentation on the behalf of [name], PA-C,as directed by  [name], PA-C while in the presence of [name], PA-C.   Established patient visit   Patient: Amanda Hendricks   DOB: 1966/02/24   57 y.o. Female  MRN: [id number] Visit Date: 10/19/2022  Today's healthcare provider: [name], PA-C   No chief complaint on file.  Subjective    HPI  Patient reports symptoms started yesterday. Symptoms consist of fever last night of 101.2 taken oral, scratchy throat, cough, body aches and headache. Patient has taken tylenol, Excedrin, ibuprofen and oxy. Patients mother tested positive for covid on Monday. Medications: Outpatient Medications Prior to Visit  Medication Sig   acetaminophen (TYLENOL) 325 MG tablet Take 650 mg by mouth every 6 (six) hours as needed for mild pain or headache.   atorvastatin (LIPITOR) 10 MG tablet Take 1 tablet (10 mg total) by mouth at bedtime.   calcium carbonate (OS-CAL) 1250 (500 Ca) MG chewable tablet Chew 500 mg by mouth 3 (three) times daily as needed for heartburn. (Patient not taking: Reported on 10/02/2022)   divalproex (DEPAKOTE) 250 MG DR tablet Take 250 mg by mouth 2 (two) times daily. (Patient not taking: Reported on 10/02/2022)   DULoxetine (CYMBALTA) 30 MG capsule Take 1 capsule (30 mg total) by mouth daily. (Patient not taking: Reported on 10/02/2022)   levothyroxine (SYNTHROID) 100 MCG tablet Take 100 mcg by mouth daily.   metoprolol tartrate (LOPRESSOR) 25 MG tablet Take 1 tablet (25 mg total) by mouth in the morning and at bedtime.   Multiple Vitamins-Minerals (BARIATRIC MULTIVITAMINS/IRON) CAPS Take 1 tablet by mouth at bedtime.   ondansetron (ZOFRAN-ODT) 4 MG disintegrating tablet Allow 1-2 tablets to dissolve in your mouth every 8 hours as needed for nausea/vomiting   oxyCODONE HCl 15 MG TABA Take by mouth.   pantoprazole (PROTONIX)  40 MG tablet Take 40 mg by mouth 2 (two) times daily.   potassium chloride SA (KLOR-CON M20) 20 MEQ tablet Take 1 tablet (20 mEq total) by mouth 2 (two) times daily for 7 days.   promethazine (PHENERGAN) 12.5 MG tablet Take 12.5 mg by mouth every 8 (eight) hours as needed for nausea or vomiting.   QUEtiapine (SEROQUEL) 300 MG tablet Take 1 tablet (300 mg total) by mouth at bedtime.   traZODone (DESYREL) 100 MG tablet Take 1 tablet (100 mg total) by mouth at bedtime. (Patient not taking: Reported on 10/02/2022)   triamcinolone cream (KENALOG) 0.1 % Apply 1 application topically 2 (two) times daily. (Patient not taking: Reported on 10/02/2022)   Varenicline Tartrate, Starter, (CHANTIX STARTING MONTH PAK) 0.5 MG X 11 & 1 MG X 42 TBPK 0.5 mg twice daily for 11 days then 1 mg twice daily (Patient not taking: Reported on 10/02/2022)   No facility-administered medications prior to visit.    Review of Systems  {Labs  Heme  Chem  Endocrine  Serology  Results Review (optional):23779}   Objective    There were no vitals taken for this visit. {Show previous vital signs (optional):23777}  Physical Exam  ***  No results found for any visits on 10/19/22.  Assessment & Plan     ***  No follow-ups on file.      {provider attestation***:1}   [name], PA-C  [place] [phone] (phone) [phone] (fax)  [place]

## 2022-10-23 ENCOUNTER — Encounter: Payer: Self-pay | Admitting: Physician Assistant

## 2022-11-01 ENCOUNTER — Other Ambulatory Visit: Payer: Self-pay | Admitting: Physician Assistant

## 2022-11-01 ENCOUNTER — Ambulatory Visit (INDEPENDENT_AMBULATORY_CARE_PROVIDER_SITE_OTHER): Payer: Medicaid Other | Admitting: Physician Assistant

## 2022-11-01 ENCOUNTER — Encounter: Payer: Self-pay | Admitting: Physician Assistant

## 2022-11-01 VITALS — BP 168/110 | HR 90 | Ht 67.0 in | Wt 153.2 lb

## 2022-11-01 DIAGNOSIS — Z0000 Encounter for general adult medical examination without abnormal findings: Secondary | ICD-10-CM | POA: Diagnosis not present

## 2022-11-01 DIAGNOSIS — Z1211 Encounter for screening for malignant neoplasm of colon: Secondary | ICD-10-CM

## 2022-11-01 DIAGNOSIS — I10 Essential (primary) hypertension: Secondary | ICD-10-CM | POA: Diagnosis not present

## 2022-11-01 DIAGNOSIS — E785 Hyperlipidemia, unspecified: Secondary | ICD-10-CM | POA: Diagnosis not present

## 2022-11-01 DIAGNOSIS — Z1159 Encounter for screening for other viral diseases: Secondary | ICD-10-CM | POA: Diagnosis not present

## 2022-11-01 DIAGNOSIS — R110 Nausea: Secondary | ICD-10-CM | POA: Diagnosis not present

## 2022-11-01 DIAGNOSIS — E039 Hypothyroidism, unspecified: Secondary | ICD-10-CM | POA: Insufficient documentation

## 2022-11-01 DIAGNOSIS — R739 Hyperglycemia, unspecified: Secondary | ICD-10-CM

## 2022-11-01 DIAGNOSIS — G4733 Obstructive sleep apnea (adult) (pediatric): Secondary | ICD-10-CM | POA: Diagnosis not present

## 2022-11-01 MED ORDER — METOPROLOL TARTRATE 25 MG PO TABS: 25.0000 mg | ORAL_TABLET | Freq: Two times a day (BID) | ORAL | 1 refills | Status: DC

## 2022-11-01 MED ORDER — ONDANSETRON 4 MG PO TBDP: ORAL_TABLET | ORAL | 0 refills | Status: DC

## 2022-11-01 MED ORDER — ATORVASTATIN CALCIUM 10 MG PO TABS: 10.0000 mg | ORAL_TABLET | Freq: Every day | ORAL | 1 refills | Status: DC

## 2022-11-01 MED ORDER — LOSARTAN POTASSIUM 25 MG PO TABS: 25.0000 mg | ORAL_TABLET | Freq: Every day | ORAL | 1 refills | Status: DC

## 2022-11-01 NOTE — Progress Notes (Signed)
I,Sha'taria Tyson,acting as a Education administrator for [employer], PA-C.,have documented all relevant documentation on the behalf of [name], PA-C,as directed by  [name], PA-C while in the presence of [name], PA-C.   Complete physical exam   Patient: Amanda Hendricks   DOB: June 03, 1966   57 y.o. Female  MRN: [id number] Visit Date: 11/01/2022  Today's healthcare provider: [name], PA-C   Cc. cpe  Subjective    MELVENE ESKOLA is a 57 y.o. female who presents today for a complete physical exam.  She reports consuming a general diet. The patient does not participate in regular exercise at present. She generally feels well. She reports sleeping well. She does not have additional problems to discuss today.    Past Medical History:  Diagnosis Date   Allergy    Arthritis    Blood transfusion without reported diagnosis    Cancer ([place])    Compressed spine fracture ([place])    Depression    Femur fracture ([place])    Heart murmur    Hip fracture ([facility])    History of stomach ulcers    Hx of gastric bypass    Hypertension    Osteoporosis    Substance abuse ([place])    Thyroid disease    Past Surgical History:  Procedure Laterality Date   GASTRIC BYPASS  2015   GASTRIC BYPASS     Social History   Socioeconomic History   Marital status: Legally Separated    Spouse name: Not on file   Number of children: Not on file   Years of education: Not on file   Highest education level: Not on file  Occupational History   Not on file  Tobacco Use   Smoking status: Never   Smokeless tobacco: Never  Substance and Sexual Activity   Alcohol use: Not Currently   Drug use: Not Currently   Sexual activity: Not on file  Other Topics Concern   Not on file  Social History Narrative   Not on file   Social Determinants of Health   Financial Resource Strain: Not on file  Food Insecurity: Food Insecurity Present (07/31/2022)   Hunger Vital Sign    Worried About Running Out of Food  in the Last Year: Sometimes true    Ran Out of Food in the Last Year: Never true  Transportation Needs: Unmet Transportation Needs (07/31/2022)   PRAPARE - Hydrologist (Medical): Yes    Lack of Transportation (Non-Medical): Yes  Physical Activity: Not on file  Stress: Not on file  Social Connections: Not on file  Intimate Partner Violence: Not on file   Family Status  Relation Name Status   Mother  (Not Specified)   Family History  Problem Relation Age of Onset   Arthritis Mother    Hypertension Mother    Heart disease Mother    COPD Mother    Allergies  Allergen Reactions   Lisinopril Cough   Nsaids Other (See Comments)    Bleeding ulcers Bleeding ulcers    Patient Care Team: [name], PA-C as PCP - General (Physician Assistant) [name], RN as Case Manager   Medications: Outpatient Medications Prior to Visit  Medication Sig   acetaminophen (TYLENOL) 325 MG tablet Take 650 mg by mouth every 6 (six) hours as needed for mild pain or headache.   ketorolac (TORADOL) 10 MG tablet Take 1 tablet (10 mg total) by mouth every 6 (six) hours as  needed.   Multiple Vitamins-Minerals (BARIATRIC MULTIVITAMINS/IRON) CAPS Take 1 tablet by mouth at bedtime.   oxyCODONE HCl 15 MG TABA Take by mouth.   pantoprazole (PROTONIX) 40 MG tablet Take 1 tablet (40 mg total) by mouth daily.   promethazine (PHENERGAN) 12.5 MG tablet Take 12.5 mg by mouth every 8 (eight) hours as needed for nausea or vomiting.   QUEtiapine (SEROQUEL) 300 MG tablet Take 1 tablet (300 mg total) by mouth at bedtime.   traZODone (DESYREL) 100 MG tablet Take 1 tablet (100 mg total) by mouth at bedtime.   [DISCONTINUED] atorvastatin (LIPITOR) 10 MG tablet Take 1 tablet (10 mg total) by mouth at bedtime.   [DISCONTINUED] metoprolol tartrate (LOPRESSOR) 25 MG tablet Take 1 tablet (25 mg total) by mouth in the morning and at bedtime.   [DISCONTINUED] ondansetron (ZOFRAN-ODT) 4 MG  disintegrating tablet Allow 1-2 tablets to dissolve in your mouth every 8 hours as needed for nausea/vomiting   levothyroxine (SYNTHROID) 100 MCG tablet Take 100 mcg by mouth daily.   [DISCONTINUED] calcium carbonate (OS-CAL) 1250 (500 Ca) MG chewable tablet Chew 500 mg by mouth 3 (three) times daily as needed for heartburn. (Patient not taking: Reported on 11/01/2022)   [DISCONTINUED] DULoxetine (CYMBALTA) 30 MG capsule Take 1 capsule (30 mg total) by mouth daily. (Patient not taking: Reported on 11/01/2022)   [DISCONTINUED] potassium chloride SA (KLOR-CON M20) 20 MEQ tablet Take 1 tablet (20 mEq total) by mouth 2 (two) times daily for 7 days.   [DISCONTINUED] triamcinolone cream (KENALOG) 0.1 % Apply 1 application topically 2 (two) times daily. (Patient not taking: Reported on 11/01/2022)   [DISCONTINUED] Varenicline Tartrate, Starter, (CHANTIX STARTING MONTH PAK) 0.5 MG X 11 & 1 MG X 42 TBPK 0.5 mg twice daily for 11 days then 1 mg twice daily (Patient not taking: Reported on 11/01/2022)   No facility-administered medications prior to visit.    Review of Systems  HENT:  Positive for congestion, postnasal drip, rhinorrhea and sinus pain.   Genitourinary:  Positive for dysuria.    Objective    BP (!) 168/110 (BP Location: Right Arm, Patient Position: Sitting, Cuff Size: Normal)   Pulse 90   Ht 5\' 7"  (1.702 m)   Wt 153 lb 3.2 oz (69.5 kg)   SpO2 100%   BMI 23.99 kg/m     Physical Exam Constitutional:      General: She is awake.     Appearance: She is well-developed. She is not ill-appearing.  HENT:     Head: Normocephalic.     Right Ear: Tympanic membrane normal.     Left Ear: Tympanic membrane normal.     Nose: Nose normal. No congestion or rhinorrhea.     Mouth/Throat:     Pharynx: No oropharyngeal exudate or posterior oropharyngeal erythema.  Eyes:     Conjunctiva/sclera: Conjunctivae normal.     Pupils: Pupils are equal, round, and reactive to light.  Neck:     Thyroid: No  thyroid mass or thyromegaly.  Cardiovascular:     Rate and Rhythm: Normal rate and regular rhythm.     Heart sounds: Normal heart sounds.  Pulmonary:     Effort: Pulmonary effort is normal.     Breath sounds: Normal breath sounds.  Abdominal:     Palpations: Abdomen is soft.     Tenderness: There is no abdominal tenderness.  Musculoskeletal:     Right lower leg: No swelling. No edema.     Left lower leg: No swelling.  No edema.  Lymphadenopathy:     Cervical: No cervical adenopathy.  Skin:    General: Skin is warm.  Neurological:     Mental Status: She is alert and oriented to person, place, and time.  Psychiatric:        Attention and Perception: Attention normal.        Mood and Affect: Mood normal.        Speech: Speech normal.        Behavior: Behavior normal. Behavior is cooperative.     Last depression screening scores    11/01/2022    2:51 PM 07/03/2022    2:52 PM 12/06/2021    2:16 PM  PHQ 2/9 Scores  PHQ - 2 Score 0 5 5  PHQ- 9 Score 1 11 11    Last fall risk screening    11/01/2022    2:51 PM  [place] in the past year? 0  Number falls in past yr: 0  Injury with Fall? 0  Risk for fall due to : No Fall Risks  Follow up Falls evaluation completed   Last Audit-C alcohol use screening    11/01/2022    2:51 PM  Alcohol Use Disorder Test (AUDIT)  1. How often do you have a drink containing alcohol? 1  2. How many drinks containing alcohol do you have on a typical day when you are drinking? 0  3. How often do you have six or more drinks on one occasion? 0  AUDIT-C Score 1   A score of 3 or more in women, and 4 or more in men indicates increased risk for alcohol abuse, EXCEPT if all of the points are from question 1   No results found for any visits on 11/01/22.  Assessment & Plan    Routine Health Maintenance and Physical Exam  Exercise Activities and Dietary recommendations --balanced diet high in fiber and protein, low in sugars, carbs,  fats. --physical activity/exercise 30 minutes 3-5 times a week     Immunization History  Administered Date(s) Administered   Moderna Sars-Covid-2 Vaccination 08/14/2020   PFIZER Comirnaty(Gray Top)Covid-19 Tri-Sucrose Vaccine 10/29/2019, 11/24/2019   Pfizer Covid-19 Vaccine Bivalent Booster 26yrs & up 04/26/2021    Health Maintenance  Topic Date Due   Hepatitis C Screening  Never done   DTaP/Tdap/Td (1 - Tdap) Never done   Zoster Vaccines- Shingrix (1 of 2) Never done   COLONOSCOPY (Pts 45-13yrs Insurance coverage will need to be confirmed)  Never done   MAMMOGRAM  Never done   COVID-19 Vaccine (5 - 2023-24 season) 04/13/2022   INFLUENZA VACCINE  Completed   HIV Screening  Completed   HPV VACCINES  Aged Out   PAP SMEAR-Modifier  Discontinued    Discussed health benefits of physical activity, and encouraged her to engage in regular exercise appropriate for her age and condition.  Problem List Items Addressed This Visit       Cardiovascular and Mediastinum   HTN (hypertension)    Managed on metoprolol 25 mg bid  Adding losartan 25 mg today H/o cough with acei Monitor at home and f/u in 4 weeks      Relevant Medications   atorvastatin (LIPITOR) 10 MG tablet   metoprolol tartrate (LOPRESSOR) 25 MG tablet   losartan (COZAAR) 25 MG tablet   Other Relevant Orders   Comprehensive Metabolic Panel (CMET)   CBC w/Diff/Platelet     Endocrine   Hypothyroidism    [facility]  Repeat tsh/t4      Relevant Medications   metoprolol tartrate (LOPRESSOR) 25 MG tablet   Other Relevant Orders   TSH + free T4     Other   Dyslipidemia    Managed with lipitor 10 mg Ordered fasting lipids      Relevant Medications   atorvastatin (LIPITOR) 10 MG tablet   Other Relevant Orders   Lipid panel   Nausea    Chronic, post multiple gi surgeries      Relevant Medications   ondansetron (ZOFRAN-ODT) 4 MG disintegrating tablet   Other Visit Diagnoses     Annual  physical exam    -  Primary   Hyperglycemia       Relevant Orders   HgB A1c   Colon cancer screening       Relevant Orders   Ambulatory referral to Gastroenterology   Encounter for hepatitis C screening test for low risk patient       Relevant Orders   Hepatitis C Antibody       Return in about 4 weeks (around 11/29/2022) for hypertension.     I, [name], PA-C have reviewed all documentation for this visit. The documentation on  11/01/22  for the exam, diagnosis, procedures, and orders are all accurate and complete.  [name], PA-C [facility] [address] [place], [place], [address] Office: [phone] Fax: [place]

## 2022-11-01 NOTE — Assessment & Plan Note (Signed)
Chronic, post multiple gi surgeries

## 2022-11-01 NOTE — Assessment & Plan Note (Signed)
Managed on metoprolol 25 mg bid  Adding losartan 25 mg today H/o cough with acei Monitor at home and f/u in 4 weeks

## 2022-11-01 NOTE — Assessment & Plan Note (Signed)
[facility] to be managed by [facility]  Repeat tsh/t4

## 2022-11-01 NOTE — Assessment & Plan Note (Signed)
Managed with lipitor 10 mg Ordered fasting lipids

## 2022-11-02 LAB — COMPREHENSIVE METABOLIC PANEL
ALT: 63 IU/L — ABNORMAL HIGH (ref 0–32)
AST: 38 IU/L (ref 0–40)
Albumin/Globulin Ratio: 1.1 — ABNORMAL LOW (ref 1.2–2.2)
Albumin: 3.5 g/dL — ABNORMAL LOW (ref 3.8–4.9)
Alkaline Phosphatase: 309 IU/L — ABNORMAL HIGH (ref 44–121)
BUN/Creatinine Ratio: 16 (ref 9–23)
BUN: 11 mg/dL (ref 6–24)
Bilirubin Total: <0.2 mg/dL (ref 0.0–1.2)
CO2: 26 mmol/L (ref 20–29)
Calcium: 9.1 mg/dL (ref 8.7–10.2)
Chloride: 99 mmol/L (ref 96–106)
Creatinine, Ser: 0.69 mg/dL (ref 0.57–1.00)
Globulin, Total: 3.2 g/dL (ref 1.5–4.5)
Glucose: 86 mg/dL (ref 70–99)
Potassium: 4.6 mmol/L (ref 3.5–5.2)
Sodium: 137 mmol/L (ref 134–144)
Total Protein: 6.7 g/dL (ref 6.0–8.5)
eGFR: 102 mL/min/{1.73_m2} (ref >59)

## 2022-11-02 LAB — CBC WITH DIFFERENTIAL/PLATELET
Basophils Absolute: 0 10*3/uL (ref 0.0–0.2)
Basos: 1 %
EOS (ABSOLUTE): 0.1 10*3/uL (ref 0.0–0.4)
Eos: 1 %
Hematocrit: 35.6 % (ref 34.0–46.6)
Hemoglobin: 12.1 g/dL (ref 11.1–15.9)
Immature Grans (Abs): 0 10*3/uL (ref 0.0–0.1)
Immature Granulocytes: 0 %
Lymphocytes Absolute: 2.5 10*3/uL (ref 0.7–3.1)
Lymphs: 42 %
MCH: 28.9 pg (ref 26.6–33.0)
MCHC: 34 g/dL (ref 31.5–35.7)
MCV: 85 fL (ref 79–97)
Monocytes Absolute: 0.6 10*3/uL (ref 0.1–0.9)
Monocytes: 9 %
Neutrophils Absolute: 2.8 10*3/uL (ref 1.4–7.0)
Neutrophils: 47 %
Platelets: 326 10*3/uL (ref 150–450)
RBC: 4.19 x10E6/uL (ref 3.77–5.28)
RDW: 14.5 % (ref 11.7–15.4)
WBC: 6 10*3/uL (ref 3.4–10.8)

## 2022-11-02 LAB — LIPID PANEL
Chol/HDL Ratio: 4.6 ratio — ABNORMAL HIGH (ref 0.0–4.4)
Cholesterol, Total: 207 mg/dL — ABNORMAL HIGH (ref 100–199)
HDL: 45 mg/dL (ref >39)
LDL Chol Calc (NIH): 133 mg/dL — ABNORMAL HIGH (ref 0–99)
Triglycerides: 164 mg/dL — ABNORMAL HIGH (ref 0–149)
VLDL Cholesterol Cal: 29 mg/dL (ref 5–40)

## 2022-11-02 LAB — HEMOGLOBIN A1C
Est. average glucose Bld gHb Est-mCnc: 117 mg/dL
Hgb A1c MFr Bld: 5.7 % — ABNORMAL HIGH (ref 4.8–5.6)

## 2022-11-02 LAB — HEPATITIS C ANTIBODY: Hep C Virus Ab: NONREACTIVE

## 2022-11-02 LAB — TSH+FREE T4
Free T4: 0.9 ng/dL (ref 0.82–1.77)
TSH: 1.82 u[IU]/mL (ref 0.450–4.500)

## 2022-11-06 ENCOUNTER — Ambulatory Visit: Payer: Medicaid Other | Admitting: *Deleted

## 2022-11-12 DIAGNOSIS — M545 Low back pain, unspecified: Secondary | ICD-10-CM | POA: Diagnosis not present

## 2022-11-12 DIAGNOSIS — G8929 Other chronic pain: Secondary | ICD-10-CM | POA: Diagnosis not present

## 2022-11-12 DIAGNOSIS — M47816 Spondylosis without myelopathy or radiculopathy, lumbar region: Secondary | ICD-10-CM | POA: Diagnosis not present

## 2022-11-13 DIAGNOSIS — M5136 Other intervertebral disc degeneration, lumbar region: Secondary | ICD-10-CM | POA: Diagnosis not present

## 2022-11-13 DIAGNOSIS — M4854XA Collapsed vertebra, not elsewhere classified, thoracic region, initial encounter for fracture: Secondary | ICD-10-CM | POA: Diagnosis not present

## 2022-11-13 DIAGNOSIS — M545 Low back pain, unspecified: Secondary | ICD-10-CM | POA: Diagnosis not present

## 2022-11-13 DIAGNOSIS — M48061 Spinal stenosis, lumbar region without neurogenic claudication: Secondary | ICD-10-CM | POA: Diagnosis not present

## 2022-11-13 DIAGNOSIS — M47817 Spondylosis without myelopathy or radiculopathy, lumbosacral region: Secondary | ICD-10-CM | POA: Diagnosis not present

## 2022-11-13 DIAGNOSIS — M5127 Other intervertebral disc displacement, lumbosacral region: Secondary | ICD-10-CM | POA: Diagnosis not present

## 2022-11-13 DIAGNOSIS — M4807 Spinal stenosis, lumbosacral region: Secondary | ICD-10-CM | POA: Diagnosis not present

## 2022-11-13 DIAGNOSIS — E279 Disorder of adrenal gland, unspecified: Secondary | ICD-10-CM | POA: Diagnosis not present

## 2022-11-16 DIAGNOSIS — M545 Low back pain, unspecified: Secondary | ICD-10-CM | POA: Diagnosis not present

## 2022-11-16 DIAGNOSIS — M47814 Spondylosis without myelopathy or radiculopathy, thoracic region: Secondary | ICD-10-CM | POA: Diagnosis not present

## 2022-11-16 DIAGNOSIS — M546 Pain in thoracic spine: Secondary | ICD-10-CM | POA: Diagnosis not present

## 2022-11-16 DIAGNOSIS — M79673 Pain in unspecified foot: Secondary | ICD-10-CM | POA: Diagnosis not present

## 2022-11-16 DIAGNOSIS — I998 Other disorder of circulatory system: Secondary | ICD-10-CM | POA: Diagnosis not present

## 2022-11-16 DIAGNOSIS — Z79899 Other long term (current) drug therapy: Secondary | ICD-10-CM | POA: Diagnosis not present

## 2022-11-16 DIAGNOSIS — R0600 Dyspnea, unspecified: Secondary | ICD-10-CM | POA: Diagnosis not present

## 2022-11-16 DIAGNOSIS — M25559 Pain in unspecified hip: Secondary | ICD-10-CM | POA: Diagnosis not present

## 2022-11-16 DIAGNOSIS — M25552 Pain in left hip: Secondary | ICD-10-CM | POA: Diagnosis not present

## 2022-11-16 DIAGNOSIS — M791 Myalgia, unspecified site: Secondary | ICD-10-CM | POA: Diagnosis not present

## 2022-11-16 DIAGNOSIS — G894 Chronic pain syndrome: Secondary | ICD-10-CM | POA: Diagnosis not present

## 2022-11-16 DIAGNOSIS — M47816 Spondylosis without myelopathy or radiculopathy, lumbar region: Secondary | ICD-10-CM | POA: Diagnosis not present

## 2022-11-21 DIAGNOSIS — Z79899 Other long term (current) drug therapy: Secondary | ICD-10-CM | POA: Diagnosis not present

## 2022-11-21 DIAGNOSIS — G894 Chronic pain syndrome: Secondary | ICD-10-CM | POA: Diagnosis not present

## 2022-11-21 DIAGNOSIS — M25552 Pain in left hip: Secondary | ICD-10-CM | POA: Diagnosis not present

## 2022-11-21 DIAGNOSIS — M47816 Spondylosis without myelopathy or radiculopathy, lumbar region: Secondary | ICD-10-CM | POA: Diagnosis not present

## 2022-11-30 ENCOUNTER — Ambulatory Visit: Payer: Medicaid Other | Admitting: Physician Assistant

## 2022-11-30 VITALS — BP 153/104 | Temp 98.4°F | Ht 67.0 in | Wt 156.0 lb

## 2022-11-30 DIAGNOSIS — N3001 Acute cystitis with hematuria: Secondary | ICD-10-CM | POA: Diagnosis not present

## 2022-11-30 DIAGNOSIS — N3000 Acute cystitis without hematuria: Secondary | ICD-10-CM | POA: Diagnosis not present

## 2022-11-30 LAB — POCT URINALYSIS DIPSTICK
Bilirubin, UA: NEGATIVE
Glucose, UA: NEGATIVE
Ketones, UA: 5
Nitrite, UA: NEGATIVE
Protein, UA: POSITIVE — AB
Spec Grav, UA: <=1.005 — AB (ref 1.010–1.025)
Urobilinogen, UA: 0.2 E.U./dL
pH, UA: 8 (ref 5.0–8.0)

## 2022-11-30 MED ORDER — FLUCONAZOLE 150 MG PO TABS: 150.0000 mg | ORAL_TABLET | Freq: Once | ORAL | 0 refills | Status: AC

## 2022-11-30 MED ORDER — CEPHALEXIN 500 MG PO CAPS: 500.0000 mg | ORAL_CAPSULE | Freq: Two times a day (BID) | ORAL | 0 refills | Status: DC

## 2022-11-30 NOTE — Progress Notes (Unsigned)
      Established patient visit   Patient: Amanda Hendricks   DOB: 1965-10-30   57 y.o. Female  MRN: [id number] Visit Date: 11/30/2022  Today's healthcare provider: Debera Lat, PA-C   No chief complaint on file.  Subjective    HPI  Not very often A big kidny stone 2 years  3-4 cigarettie  Medications: Outpatient Medications Prior to Visit  Medication Sig   acetaminophen (TYLENOL) 325 MG tablet Take 650 mg by mouth every 6 (six) hours as needed for mild pain or headache.   atorvastatin (LIPITOR) 10 MG tablet Take 1 tablet (10 mg total) by mouth at bedtime.   ketorolac (TORADOL) 10 MG tablet Take 1 tablet (10 mg total) by mouth every 6 (six) hours as needed.   levothyroxine (SYNTHROID) 100 MCG tablet Take 100 mcg by mouth daily.   losartan (COZAAR) 25 MG tablet Take 1 tablet (25 mg total) by mouth daily.   metoprolol tartrate (LOPRESSOR) 25 MG tablet Take 1 tablet (25 mg total) by mouth in the morning and at bedtime.   Multiple Vitamins-Minerals (BARIATRIC MULTIVITAMINS/IRON) CAPS Take 1 tablet by mouth at bedtime.   ondansetron (ZOFRAN-ODT) 4 MG disintegrating tablet Allow 1-2 tablets to dissolve in your mouth every 8 hours as needed for nausea/vomiting   oxyCODONE HCl 15 MG TABA Take by mouth.   pantoprazole (PROTONIX) 40 MG tablet Take 1 tablet (40 mg total) by mouth daily.   promethazine (PHENERGAN) 12.5 MG tablet Take 12.5 mg by mouth every 8 (eight) hours as needed for nausea or vomiting.   QUEtiapine (SEROQUEL) 300 MG tablet Take 1 tablet (300 mg total) by mouth at bedtime.   traZODone (DESYREL) 100 MG tablet Take 1 tablet (100 mg total) by mouth at bedtime.   No facility-administered medications prior to visit.    Review of Systems  {Labs  Heme  Chem  Endocrine  Serology  Results Review (optional):23779}   Objective    There were no vitals taken for this visit. {Show previous vital signs (optional):23777}  Physical Exam  ***  No results found for any  visits on 11/30/22.  Assessment & Plan     ***  No follow-ups on file.      The patient was advised to call back or seek an in-person evaluation if the symptoms worsen or if the condition fails to improve as anticipated.  I discussed the assessment and treatment plan with the patient. The patient was provided an opportunity to ask questions and all were answered. The patient agreed with the plan and demonstrated an understanding of the instructions.  I, Debera Lat, PA-C have reviewed all documentation for this visit. The documentation on 11/30/2022 for the exam, diagnosis, procedures, and orders are all accurate and complete.  Debera Lat, [facility], MMS [facility] [phone] (phone) [phone] (fax)  [facility]

## 2022-12-02 ENCOUNTER — Encounter: Payer: Self-pay | Admitting: Physician Assistant

## 2022-12-03 ENCOUNTER — Encounter: Payer: Self-pay | Admitting: Physician Assistant

## 2022-12-05 LAB — URINE CULTURE

## 2022-12-05 LAB — SPECIMEN STATUS REPORT

## 2022-12-06 NOTE — Progress Notes (Signed)
Ur culture found escherichia coli, you do not need to change your current antibiotic

## 2023-01-04 ENCOUNTER — Other Ambulatory Visit: Payer: Self-pay | Admitting: Physician Assistant

## 2023-01-04 DIAGNOSIS — I10 Essential (primary) hypertension: Secondary | ICD-10-CM

## 2023-01-17 DIAGNOSIS — F3132 Bipolar disorder, current episode depressed, moderate: Secondary | ICD-10-CM | POA: Diagnosis not present

## 2023-01-22 NOTE — Progress Notes (Unsigned)
    I,Orla Jolliff,acting as a Neurosurgeon for [employer], PA-C.,have documented all relevant documentation on the behalf of Alfredia Ferguson, PA-C,as directed by  Alfredia Ferguson, PA-C while in the presence of Alfredia Ferguson, PA-C.    Established patient visit   Patient: Amanda Hendricks   DOB: Aug 09, 1966   57 y.o. Female  MRN: [id number] Visit Date: 01/23/2023  Today's healthcare provider: Alfredia Ferguson, PA-C   No chief complaint on file.  Subjective    HPI  Patient is here today for headaches,  Medications: Outpatient Medications Prior to Visit  Medication Sig   acetaminophen (TYLENOL) 325 MG tablet Take 650 mg by mouth every 6 (six) hours as needed for mild pain or headache.   atorvastatin (LIPITOR) 10 MG tablet Take 1 tablet (10 mg total) by mouth at bedtime.   cephALEXin (KEFLEX) 500 MG capsule Take 1 capsule (500 mg total) by mouth 2 (two) times daily.   levothyroxine (SYNTHROID) 100 MCG tablet Take 100 mcg by mouth daily.   losartan (COZAAR) 25 MG tablet TAKE 1 TABLET(25 MG) BY MOUTH DAILY   metoprolol tartrate (LOPRESSOR) 25 MG tablet Take 1 tablet (25 mg total) by mouth in the morning and at bedtime.   Multiple Vitamins-Minerals (BARIATRIC MULTIVITAMINS/IRON) CAPS Take 1 tablet by mouth at bedtime.   ondansetron (ZOFRAN-ODT) 4 MG disintegrating tablet Allow 1-2 tablets to dissolve in your mouth every 8 hours as needed for nausea/vomiting   oxyCODONE HCl 15 MG TABA Take by mouth.   pantoprazole (PROTONIX) 40 MG tablet Take 1 tablet (40 mg total) by mouth daily.   promethazine (PHENERGAN) 12.5 MG tablet Take 12.5 mg by mouth every 8 (eight) hours as needed for nausea or vomiting.   QUEtiapine (SEROQUEL) 300 MG tablet Take 1 tablet (300 mg total) by mouth at bedtime.   traZODone (DESYREL) 100 MG tablet Take 1 tablet (100 mg total) by mouth at bedtime.   No facility-administered medications prior to visit.    Review of Systems  {Labs  Heme  Chem  Endocrine  Serology   Results Review (optional):23779}   Objective    There were no vitals taken for this visit. {Show previous Ruel Dimmick signs (optional):23777}  Physical Exam  ***  No results found for any visits on 01/23/23.  Assessment & Plan     ***  No follow-ups on file.      {provider attestation***:1}   Alfredia Ferguson, PA-C  [facility] [phone] (phone) [phone] (fax)  [facility]

## 2023-01-23 ENCOUNTER — Ambulatory Visit: Payer: Medicaid Other | Admitting: Physician Assistant

## 2023-01-23 ENCOUNTER — Encounter: Payer: Self-pay | Admitting: Physician Assistant

## 2023-01-23 VITALS — BP 111/81 | HR 101 | Temp 98.9°F | Resp 15 | Ht 67.0 in | Wt 157.1 lb

## 2023-01-23 DIAGNOSIS — R300 Dysuria: Secondary | ICD-10-CM

## 2023-01-23 DIAGNOSIS — R822 Biliuria: Secondary | ICD-10-CM | POA: Diagnosis not present

## 2023-01-23 DIAGNOSIS — R519 Headache, unspecified: Secondary | ICD-10-CM

## 2023-01-23 LAB — POCT URINALYSIS DIPSTICK
Glucose, UA: NEGATIVE
Ketones, UA: NEGATIVE
Leukocytes, UA: NEGATIVE
Nitrite, UA: NEGATIVE
Protein, UA: POSITIVE — AB
Spec Grav, UA: >=1.03 — AB (ref 1.010–1.025)
Urobilinogen, UA: 0.2 E.U./dL
pH, UA: 6 (ref 5.0–8.0)

## 2023-01-24 LAB — COMPREHENSIVE METABOLIC PANEL
ALT: 14 IU/L (ref 0–32)
AST: 25 IU/L (ref 0–40)
Albumin/Globulin Ratio: 1.2
Albumin: 3.4 g/dL — ABNORMAL LOW (ref 3.8–4.9)
Alkaline Phosphatase: 227 IU/L — ABNORMAL HIGH (ref 44–121)
BUN/Creatinine Ratio: 9 (ref 9–23)
BUN: 8 mg/dL (ref 6–24)
Bilirubin Total: <0.2 mg/dL (ref 0.0–1.2)
CO2: 23 mmol/L (ref 20–29)
Calcium: 8.6 mg/dL — ABNORMAL LOW (ref 8.7–10.2)
Chloride: 101 mmol/L (ref 96–106)
Creatinine, Ser: 0.88 mg/dL (ref 0.57–1.00)
Globulin, Total: 2.9 g/dL (ref 1.5–4.5)
Glucose: 95 mg/dL (ref 70–99)
Potassium: 4.4 mmol/L (ref 3.5–5.2)
Sodium: 136 mmol/L (ref 134–144)
Total Protein: 6.3 g/dL (ref 6.0–8.5)
eGFR: 77 mL/min/{1.73_m2} (ref >59)

## 2023-01-24 LAB — URINALYSIS, MICROSCOPIC ONLY
Casts: NONE SEEN /lpf
Epithelial Cells (non renal): >10 /hpf — AB (ref 0–10)
RBC, Urine: NONE SEEN /hpf (ref 0–2)

## 2023-01-27 LAB — URINE CULTURE

## 2023-01-28 ENCOUNTER — Other Ambulatory Visit: Payer: Self-pay | Admitting: Physician Assistant

## 2023-01-28 DIAGNOSIS — B379 Candidiasis, unspecified: Secondary | ICD-10-CM

## 2023-01-28 DIAGNOSIS — G8929 Other chronic pain: Secondary | ICD-10-CM

## 2023-01-28 DIAGNOSIS — N3001 Acute cystitis with hematuria: Secondary | ICD-10-CM

## 2023-01-28 MED ORDER — NURTEC 75 MG PO TBDP
1.0000 | ORAL_TABLET | Freq: Every day | ORAL | 1 refills | Status: DC | PRN
Start: 2023-01-28 — End: 2023-02-07

## 2023-01-28 MED ORDER — FLUCONAZOLE 150 MG PO TABS
150.0000 mg | ORAL_TABLET | Freq: Once | ORAL | 0 refills | Status: AC
Start: 2023-01-28 — End: 2023-01-28

## 2023-01-28 MED ORDER — NITROFURANTOIN MONOHYD MACRO 100 MG PO CAPS
100.0000 mg | ORAL_CAPSULE | Freq: Two times a day (BID) | ORAL | 0 refills | Status: AC
Start: 2023-01-28 — End: 2023-02-02

## 2023-01-29 ENCOUNTER — Other Ambulatory Visit: Payer: Self-pay | Admitting: Physician Assistant

## 2023-01-29 DIAGNOSIS — I10 Essential (primary) hypertension: Secondary | ICD-10-CM

## 2023-01-29 DIAGNOSIS — E785 Hyperlipidemia, unspecified: Secondary | ICD-10-CM

## 2023-02-05 ENCOUNTER — Telehealth: Payer: Self-pay | Admitting: Physician Assistant

## 2023-02-05 NOTE — Telephone Encounter (Signed)
Patient called stating Nurtec is really helping her headaches and would like for you to proceed with prior authorization and send in RX to [facility].  She is out of samples.

## 2023-02-07 ENCOUNTER — Other Ambulatory Visit: Payer: Self-pay | Admitting: Physician Assistant

## 2023-02-07 DIAGNOSIS — G43709 Chronic migraine without aura, not intractable, without status migrainosus: Secondary | ICD-10-CM

## 2023-02-07 MED ORDER — NURTEC 75 MG PO TBDP: 1.0000 | ORAL_TABLET | Freq: Every day | ORAL | 1 refills | Status: DC | PRN

## 2023-02-07 NOTE — Telephone Encounter (Signed)
Patient was advised.  

## 2023-02-15 DIAGNOSIS — M47816 Spondylosis without myelopathy or radiculopathy, lumbar region: Secondary | ICD-10-CM | POA: Diagnosis not present

## 2023-02-15 DIAGNOSIS — G894 Chronic pain syndrome: Secondary | ICD-10-CM | POA: Diagnosis not present

## 2023-02-15 DIAGNOSIS — M25559 Pain in unspecified hip: Secondary | ICD-10-CM | POA: Diagnosis not present

## 2023-02-15 DIAGNOSIS — M47814 Spondylosis without myelopathy or radiculopathy, thoracic region: Secondary | ICD-10-CM | POA: Diagnosis not present

## 2023-02-15 DIAGNOSIS — R0600 Dyspnea, unspecified: Secondary | ICD-10-CM | POA: Diagnosis not present

## 2023-02-15 DIAGNOSIS — M25552 Pain in left hip: Secondary | ICD-10-CM | POA: Diagnosis not present

## 2023-02-15 DIAGNOSIS — R6889 Other general symptoms and signs: Secondary | ICD-10-CM | POA: Diagnosis not present

## 2023-02-15 DIAGNOSIS — Z79899 Other long term (current) drug therapy: Secondary | ICD-10-CM | POA: Diagnosis not present

## 2023-02-15 DIAGNOSIS — I998 Other disorder of circulatory system: Secondary | ICD-10-CM | POA: Diagnosis not present

## 2023-02-15 DIAGNOSIS — M791 Myalgia, unspecified site: Secondary | ICD-10-CM | POA: Diagnosis not present

## 2023-02-20 ENCOUNTER — Telehealth: Payer: Self-pay | Admitting: Physician Assistant

## 2023-02-21 ENCOUNTER — Other Ambulatory Visit: Payer: Self-pay | Admitting: Physician Assistant

## 2023-02-21 ENCOUNTER — Telehealth: Payer: Self-pay | Admitting: Physician Assistant

## 2023-02-21 DIAGNOSIS — F3131 Bipolar disorder, current episode depressed, mild: Secondary | ICD-10-CM | POA: Diagnosis not present

## 2023-02-21 DIAGNOSIS — G43709 Chronic migraine without aura, not intractable, without status migrainosus: Secondary | ICD-10-CM

## 2023-02-21 NOTE — Telephone Encounter (Signed)
Nurtec rx was denied by insurance I have sent in a referral to neurology for her for further information/eval.  She can get samples from here until that appt

## 2023-02-21 NOTE — Telephone Encounter (Signed)
LVMTCB. CRM created. Ok for PEC to advise 

## 2023-02-21 NOTE — Telephone Encounter (Signed)
Pt returned call and message from PCP given. Pt verbalized understanding.

## 2023-02-22 ENCOUNTER — Telehealth: Payer: Self-pay | Admitting: Physician Assistant

## 2023-02-22 ENCOUNTER — Other Ambulatory Visit: Payer: Self-pay

## 2023-02-22 DIAGNOSIS — R110 Nausea: Secondary | ICD-10-CM

## 2023-02-22 MED ORDER — ONDANSETRON 4 MG PO TBDP: ORAL_TABLET | ORAL | 0 refills | Status: DC

## 2023-02-22 NOTE — Telephone Encounter (Signed)
Patient requesting refill ondansetron (ZOFRAN-ODT) 4 MG disintegrating tablet   Please advise

## 2023-03-07 DIAGNOSIS — F3131 Bipolar disorder, current episode depressed, mild: Secondary | ICD-10-CM | POA: Diagnosis not present

## 2023-03-13 DIAGNOSIS — Z8781 Personal history of (healed) traumatic fracture: Secondary | ICD-10-CM | POA: Diagnosis not present

## 2023-03-13 DIAGNOSIS — M7062 Trochanteric bursitis, left hip: Secondary | ICD-10-CM | POA: Diagnosis not present

## 2023-03-13 DIAGNOSIS — Z96642 Presence of left artificial hip joint: Secondary | ICD-10-CM | POA: Diagnosis not present

## 2023-03-13 DIAGNOSIS — Z471 Aftercare following joint replacement surgery: Secondary | ICD-10-CM | POA: Diagnosis not present

## 2023-03-14 DIAGNOSIS — F3131 Bipolar disorder, current episode depressed, mild: Secondary | ICD-10-CM | POA: Diagnosis not present

## 2023-03-15 ENCOUNTER — Ambulatory Visit
Admission: RE | Admit: 2023-03-15 | Discharge: 2023-03-15 | Disposition: A | Discharge status: Home / Self Care | Payer: Medicaid Other | Source: Ambulatory Visit | Attending: Physician Assistant | Admitting: Physician Assistant

## 2023-03-15 DIAGNOSIS — Z1231 Encounter for screening mammogram for malignant neoplasm of breast: Secondary | ICD-10-CM | POA: Diagnosis not present

## 2023-03-19 ENCOUNTER — Other Ambulatory Visit: Payer: Self-pay | Admitting: *Deleted

## 2023-03-19 ENCOUNTER — Inpatient Hospital Stay
Admission: RE | Admit: 2023-03-19 | Discharge: 2023-03-19 | Disposition: A | Discharge status: Home / Self Care | Payer: Self-pay | Source: Ambulatory Visit | Attending: Physician Assistant | Admitting: Physician Assistant

## 2023-03-19 DIAGNOSIS — Z1231 Encounter for screening mammogram for malignant neoplasm of breast: Secondary | ICD-10-CM

## 2023-04-06 ENCOUNTER — Other Ambulatory Visit: Payer: Self-pay | Admitting: Physician Assistant

## 2023-04-06 DIAGNOSIS — I10 Essential (primary) hypertension: Secondary | ICD-10-CM

## 2023-04-10 ENCOUNTER — Other Ambulatory Visit: Payer: Self-pay | Admitting: Physician Assistant

## 2023-04-10 DIAGNOSIS — K219 Gastro-esophageal reflux disease without esophagitis: Secondary | ICD-10-CM

## 2023-04-18 DIAGNOSIS — F3131 Bipolar disorder, current episode depressed, mild: Secondary | ICD-10-CM | POA: Diagnosis not present

## 2023-04-25 DIAGNOSIS — F3131 Bipolar disorder, current episode depressed, mild: Secondary | ICD-10-CM | POA: Diagnosis not present

## 2023-05-02 DIAGNOSIS — F3131 Bipolar disorder, current episode depressed, mild: Secondary | ICD-10-CM | POA: Diagnosis not present

## 2023-05-03 DIAGNOSIS — Z5181 Encounter for therapeutic drug level monitoring: Secondary | ICD-10-CM | POA: Diagnosis not present

## 2023-05-03 DIAGNOSIS — I998 Other disorder of circulatory system: Secondary | ICD-10-CM | POA: Diagnosis not present

## 2023-05-03 DIAGNOSIS — M47814 Spondylosis without myelopathy or radiculopathy, thoracic region: Secondary | ICD-10-CM | POA: Diagnosis not present

## 2023-05-03 DIAGNOSIS — Z79899 Other long term (current) drug therapy: Secondary | ICD-10-CM | POA: Diagnosis not present

## 2023-05-03 DIAGNOSIS — M4726 Other spondylosis with radiculopathy, lumbar region: Secondary | ICD-10-CM | POA: Diagnosis not present

## 2023-05-03 DIAGNOSIS — M791 Myalgia, unspecified site: Secondary | ICD-10-CM | POA: Diagnosis not present

## 2023-05-03 DIAGNOSIS — G894 Chronic pain syndrome: Secondary | ICD-10-CM | POA: Diagnosis not present

## 2023-05-03 DIAGNOSIS — M25552 Pain in left hip: Secondary | ICD-10-CM | POA: Diagnosis not present

## 2023-05-03 DIAGNOSIS — R6889 Other general symptoms and signs: Secondary | ICD-10-CM | POA: Diagnosis not present

## 2023-05-03 DIAGNOSIS — R0600 Dyspnea, unspecified: Secondary | ICD-10-CM | POA: Diagnosis not present

## 2023-05-14 DIAGNOSIS — F3131 Bipolar disorder, current episode depressed, mild: Secondary | ICD-10-CM | POA: Diagnosis not present

## 2023-05-16 DIAGNOSIS — M48061 Spinal stenosis, lumbar region without neurogenic claudication: Secondary | ICD-10-CM | POA: Diagnosis not present

## 2023-05-16 DIAGNOSIS — M47817 Spondylosis without myelopathy or radiculopathy, lumbosacral region: Secondary | ICD-10-CM | POA: Diagnosis not present

## 2023-05-16 DIAGNOSIS — M5416 Radiculopathy, lumbar region: Secondary | ICD-10-CM | POA: Diagnosis not present

## 2023-05-16 DIAGNOSIS — M2578 Osteophyte, vertebrae: Secondary | ICD-10-CM | POA: Diagnosis not present

## 2023-05-16 DIAGNOSIS — E279 Disorder of adrenal gland, unspecified: Secondary | ICD-10-CM | POA: Diagnosis not present

## 2023-05-16 DIAGNOSIS — M47816 Spondylosis without myelopathy or radiculopathy, lumbar region: Secondary | ICD-10-CM | POA: Diagnosis not present

## 2023-05-17 ENCOUNTER — Other Ambulatory Visit: Payer: Self-pay | Admitting: Physician Assistant

## 2023-05-17 DIAGNOSIS — Z1212 Encounter for screening for malignant neoplasm of rectum: Secondary | ICD-10-CM

## 2023-05-17 DIAGNOSIS — Z1211 Encounter for screening for malignant neoplasm of colon: Secondary | ICD-10-CM

## 2023-06-05 ENCOUNTER — Telehealth: Payer: Self-pay | Admitting: *Deleted

## 2023-06-05 NOTE — Patient Outreach (Signed)
  Care Management   Note  06/05/2023 Name: Amanda Hendricks MRN: [id number] DOB: September 25, 1965  Amanda Hendricks is enrolled in a Managed Medicaid plan: Yes. Outreach attempt today was unsuccessful.   A HIPPA compliant phone message was left for the patient providing contact information and requesting a return call.   Estanislado Emms RN, BSN [place]  [facility] RN Care Coordinator [phone]

## 2023-06-07 ENCOUNTER — Telehealth: Payer: Self-pay | Admitting: *Deleted

## 2023-06-07 NOTE — Patient Outreach (Signed)
  Care Management   Note  06/07/2023 Name: Amanda Hendricks MRN: [id number] DOB: 04/09/1966  [name] is enrolled in a Managed Medicaid plan: Yes. Outreach attempt today was successful.   Telephone follow up appointment with care management team member scheduled for:06/11/23 at 3:30pm  Estanislado Emms RN, BSN [place]  [facility] RN Care Coordinator [phone]

## 2023-06-10 ENCOUNTER — Ambulatory Visit: Payer: Medicaid Other | Admitting: Neurology

## 2023-06-10 ENCOUNTER — Encounter: Payer: Self-pay | Admitting: Neurology

## 2023-06-10 VITALS — BP 154/95 | HR 104 | Ht 67.0 in | Wt 162.0 lb

## 2023-06-10 DIAGNOSIS — R110 Nausea: Secondary | ICD-10-CM | POA: Diagnosis not present

## 2023-06-10 DIAGNOSIS — R519 Headache, unspecified: Secondary | ICD-10-CM

## 2023-06-10 DIAGNOSIS — G43909 Migraine, unspecified, not intractable, without status migrainosus: Secondary | ICD-10-CM

## 2023-06-10 DIAGNOSIS — G43709 Chronic migraine without aura, not intractable, without status migrainosus: Secondary | ICD-10-CM

## 2023-06-10 MED ORDER — NURTEC 75 MG PO TBDP
1.0000 | ORAL_TABLET | Freq: Every day | ORAL | 3 refills | Status: AC | PRN
Start: 2023-06-10 — End: ?

## 2023-06-10 MED ORDER — ONDANSETRON 4 MG PO TBDP
4.0000 mg | ORAL_TABLET | Freq: Three times a day (TID) | ORAL | 0 refills | Status: DC | PRN
Start: 2023-06-10 — End: 2024-06-01
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
## 2023-06-10 NOTE — Patient Instructions (Addendum)
It was nice to meet you today.    This is what we discussed today and here are my recommendations for you from today's visit:   Please remember, common headache triggers are: sleep deprivation, dehydration, overheating, stress, hypoglycemia or skipping meals and blood sugar fluctuations, excessive pain medications or excessive alcohol use or caffeine withdrawal. Some people have food triggers such as aged cheese, orange juice or chocolate, especially dark chocolate, or MSG (monosodium glutamate). Try to avoid these headache triggers as much possible. It may be helpful to keep a headache diary to figure out what makes your headaches worse or brings them on and what alleviates them. Some people report headache onset after exercise but studies have shown that regular exercise may actually prevent headaches from coming. If you have exercise-induced headaches, please make sure that you drink plenty of fluid before and after exercising and that you do not over do it and do not overheat.  2. For as needed use for acute migraine: I will prescribe Nurtec 75 mg strength, as it has helped and you have been able to tolerate it: Take 1 pill at onset of migraine headache. May cause sedation and nausea.  I have also sent a prescription for Zofran as needed for nausea to your pharmacy.   3. We will do a brain MRI with and without contrast. We will call you to schedule after we have insurance approval. If you don't hear back from our office by next week, call us back.   4.  Please increase your water intake to about 3-4 bottles of water per day.  Please continue to work on smoking cessation.  Reduce your caffeine intake and limit yourself to 1 bottle of soda per day at the most.

## 2023-06-11 ENCOUNTER — Telehealth: Payer: Self-pay | Admitting: Neurology

## 2023-06-11 ENCOUNTER — Other Ambulatory Visit: Payer: Self-pay | Admitting: *Deleted

## 2023-06-11 LAB — COMPREHENSIVE METABOLIC PANEL
ALT: 11 [IU]/L (ref 0–32)
AST: 21 [IU]/L (ref 0–40)
Albumin: 3.5 g/dL — ABNORMAL LOW (ref 3.8–4.9)
Alkaline Phosphatase: 202 [IU]/L — ABNORMAL HIGH (ref 44–121)
BUN/Creatinine Ratio: 10 (ref 9–23)
BUN: 8 mg/dL (ref 6–24)
Bilirubin Total: <0.2 mg/dL (ref 0.0–1.2)
CO2: 25 mmol/L (ref 20–29)
Calcium: 8.7 mg/dL (ref 8.7–10.2)
Chloride: 98 mmol/L (ref 96–106)
Creatinine, Ser: 0.79 mg/dL (ref 0.57–1.00)
Globulin, Total: 2.7 g/dL (ref 1.5–4.5)
Glucose: 90 mg/dL (ref 70–99)
Potassium: 4.2 mmol/L (ref 3.5–5.2)
Sodium: 138 mmol/L (ref 134–144)
Total Protein: 6.2 g/dL (ref 6.0–8.5)
eGFR: 87 mL/min/{1.73_m2} (ref >59)

## 2023-06-11 NOTE — Patient Outreach (Signed)
  Medicaid Managed Care   Unsuccessful Attempt Note   06/11/2023 Name: Amanda Hendricks MRN: [id number] DOB: 01/01/1966  Referred by: Jacky Kindle, FNP Reason for referral : High Risk Managed Medicaid (Unsuccessful RNCM follow up telephone outreach)   An unsuccessful telephone outreach was attempted today. The patient was referred to the case management team for assistance with care management and care coordination.    Follow Up Plan: A HIPAA compliant phone message was left for the patient providing contact information and requesting a return call. and The Managed Medicaid care management team will reach out to the patient again over the next 7 days.    Estanislado Emms RN, BSN [place]  [facility] Institute [facility] RN Care Coordinator [phone]

## 2023-06-11 NOTE — Patient Instructions (Signed)
Visit Information  Ms. [name]  - as a part of your Medicaid benefit, you are eligible for care management and care coordination services at no cost or copay. I was unable to reach you by phone today but would be happy to help you with your health related needs. Please feel free to call me @ [phone].   A member of the Managed Medicaid care management team will reach out to you again over the next 7 days.   Estanislado Emms RN, BSN [place]  Value-Based Care Institute [facility] RN Care Coordinator [phone]

## 2023-06-11 NOTE — Telephone Encounter (Signed)
[facility] [place]: [id number] exp. 06/11/23-08/09/23 sent to GI [phone]

## 2023-06-13 ENCOUNTER — Telehealth: Payer: Self-pay | Admitting: *Deleted

## 2023-06-13 NOTE — Telephone Encounter (Signed)
Spoke with the patient and discussed lab results as noted below by Dr. Frances Furbish.  The patient verbalized understanding.  She will watch for a call to schedule her MRI brain.  I did provide her with [facility] imaging's phone number for her to follow-up if needed.  She also stated that she found out Nurtec needs a PA through [place] before she can fill it.

## 2023-06-13 NOTE — Telephone Encounter (Signed)
-----   Message from Huston Foley sent at 06/11/2023  7:18 AM EDT ----- Please advise patient that her kidney function is fine and we can proceed with a brain MRI with and without contrast.  One of her liver enzymes called alkaline phosphatase is elevated but actually improved from June 2024.  Her albumin level which is a marker for protein content is mildly below normal but actually also improved from June 2024.

## 2023-06-14 ENCOUNTER — Other Ambulatory Visit (HOSPITAL_COMMUNITY): Payer: Self-pay

## 2023-06-14 ENCOUNTER — Telehealth: Payer: Self-pay

## 2023-06-14 NOTE — Telephone Encounter (Signed)
   Could not find record in chart of triptan use or contraindication-Medicaid prefers PT to try Rizatriptan or Sumatriptan first-Please advise  Key: [id number]

## 2023-06-17 ENCOUNTER — Other Ambulatory Visit (HOSPITAL_COMMUNITY): Payer: Self-pay

## 2023-06-17 ENCOUNTER — Telehealth: Payer: Self-pay

## 2023-06-17 NOTE — Telephone Encounter (Signed)
..   Medicaid Managed Care   Unsuccessful Outreach Note  06/17/2023 Name: Amanda Hendricks MRN: [id number] DOB: 03-Dec-1965  Referred by: Jacky Kindle, FNP Reason for referral : Appointment (I called the patient today to get her rescheduled for her missed phone appointment with the MM RNCM. She did not answer but I was able to leave a message.)   A second unsuccessful telephone outreach was attempted today. The patient was referred to the case management team for assistance with care management and care coordination.   Follow Up Plan: A HIPAA compliant phone message was left for the patient providing contact information and requesting a return call.  The care management team will reach out to the patient again over the next 7 days.   Weston Settle Care Guide  [facility] Managed  Care Guide [facility]  [phone]

## 2023-06-17 NOTE — Telephone Encounter (Signed)
Pharmacy Patient Advocate Encounter   Received notification from Physician's Office that prior authorization for Nurtec 75MG  dispersible tablets is required/requested.   Insurance verification completed.   The patient is insured through [facility] .   Per test claim: PA required; PA submitted to above mentioned insurance via CoverMyMeds Key/confirmation #/[id number] Status is pending

## 2023-06-17 NOTE — Telephone Encounter (Signed)
Patient has Presumed Coronary artery disease involving native coronary artery of native heart without angina pectoris. (Presumed due to abnormal nuclear stress test). This makes triptans contraindicated.

## 2023-06-17 NOTE — Telephone Encounter (Signed)
Fax confirmation received [employer] for [employer] and call pt when ready for pick up.

## 2023-06-17 NOTE — Telephone Encounter (Signed)
Pharmacy Patient Advocate Encounter  Received notification from [facility] that Prior Authorization for Nurtec 75MG  dispersible tablets has been APPROVED from 06/17/2023 to 06/16/2024. Ran test claim, Copay is $4.00. This test claim was processed through [facility]- copay amounts may vary at other pharmacies due to pharmacy/plan contracts, or as the patient moves through the different stages of their insurance plan.   PA #/Case ID/Reference #: PA Case ID #: [id number]

## 2023-06-19 DIAGNOSIS — M47814 Spondylosis without myelopathy or radiculopathy, thoracic region: Secondary | ICD-10-CM | POA: Diagnosis not present

## 2023-06-19 DIAGNOSIS — R6889 Other general symptoms and signs: Secondary | ICD-10-CM | POA: Diagnosis not present

## 2023-06-19 DIAGNOSIS — R0600 Dyspnea, unspecified: Secondary | ICD-10-CM | POA: Diagnosis not present

## 2023-06-19 DIAGNOSIS — G894 Chronic pain syndrome: Secondary | ICD-10-CM | POA: Diagnosis not present

## 2023-06-19 DIAGNOSIS — Z79899 Other long term (current) drug therapy: Secondary | ICD-10-CM | POA: Diagnosis not present

## 2023-06-19 DIAGNOSIS — I998 Other disorder of circulatory system: Secondary | ICD-10-CM | POA: Diagnosis not present

## 2023-06-19 DIAGNOSIS — M4726 Other spondylosis with radiculopathy, lumbar region: Secondary | ICD-10-CM | POA: Diagnosis not present

## 2023-06-19 DIAGNOSIS — M791 Myalgia, unspecified site: Secondary | ICD-10-CM | POA: Diagnosis not present

## 2023-06-24 ENCOUNTER — Telehealth: Payer: Self-pay

## 2023-06-24 ENCOUNTER — Other Ambulatory Visit: Payer: Self-pay | Admitting: *Deleted

## 2023-06-24 NOTE — Patient Outreach (Signed)
   Care Management/Care Coordination  RN Case Manager Case Closure Note  06/24/2023 Name: Amanda Hendricks MRN: [id number] DOB: [birth year]  Amanda Hendricks is a 57 y.o. year old female who is a primary care patient of Jacky Kindle, FNP. The care management/care coordination team was consulted for assistance with chronic disease management and/or care coordination needs.   Care Plan : RN Care Manager Plan of Care  Updates made by Heidi Dach, RN since 06/24/2023 12:00 AM  Completed 06/24/2023   Problem: Health Management needs related to Chronic Pain Resolved 06/24/2023     Long-Range Goal: Development of Plan of Care to address Health Management needs related to Chronic Pain Completed 06/24/2023  Start Date: 10/02/2022  Expected End Date: 01/30/2023  Note:   Current Barriers:  Chronic Disease Management support and education needs related to Chronic Pain Unable to maintain contact with patient  RNCM Clinical Goal(s):  Patient will verbalize understanding of plan for management of Chronic Pain as evidenced by Patient reports take all medications exactly as prescribed and will call provider for medication related questions as evidenced by patient reports    attend all scheduled medical appointments: PCP on 11/01/22 as evidenced by provider documentation in EMR        work with social worker to address Financial constraints related to Chronic Pain related to the management of Chronic Pain as evidenced by review of EMR and patient or Child psychotherapist report     through collaboration with Medical illustrator, provider, and care team.   Interventions: Inter-disciplinary care team collaboration (see longitudinal plan of care) Evaluation of current treatment plan related to  self management and patient's adherence to plan as established by provider BSW completed a telephone outreach with patient. 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 Patient states no resources are needed at this time.  Pain:  (Status: New goal.) Long Term Goal  Pain assessment performed Medications reviewed Reviewed provider established plan for pain management; Discussed importance of adherence to all scheduled medical appointments; Counseled on the importance of reporting any/all new or changed pain symptoms or management strategies to pain management provider; Advised patient to report to care team affect of pain on daily activities; Discussed use of relaxation techniques and/or diversional activities to assist with pain reduction (distraction, imagery, relaxation, massage, acupressure, TENS, heat, and cold application; Reviewed with patient prescribed pharmacological and nonpharmacological pain relief strategies; Advised patient to discuss Aquatic Therapy with provider; Rescheduled patient with BSW for assistance with financial resources  Patient Goals/Self-Care Activities: Attend all scheduled provider appointments Call provider office for new concerns or questions  Work with the social worker to address care coordination needs and will continue to work with the clinical team to address health care and disease management related needs Discuss aquatic therapy with Pain Management provider       Plan: We have been unable to make contact with the patient for follow up. The care management team is available to follow up with the patient should new care management/care coordination needs arise.   Estanislado Emms RN, BSN [place]  Value-Based Care Institute [facility] RN Care Coordinator [phone]

## 2023-06-24 NOTE — Telephone Encounter (Signed)
..   Medicaid Managed Care   Unsuccessful Outreach Note  06/24/2023 Name: Amanda Hendricks MRN: [id number] DOB: 1966/06/30  Referred by: Jacky Kindle, FNP Reason for referral : Appointment   Third unsuccessful telephone outreach was attempted today. The patient was referred to the case management team for assistance with care management and care coordination. The patient's primary care provider has been notified of our unsuccessful attempts to make or maintain contact with the patient. The care management team is pleased to engage with this patient at any time in the future should he/she be interested in assistance from the care management team.   Follow Up Plan: We have been unable to make contact with the patient for follow up. The care management team is available to follow up with the patient after provider conversation with the patient regarding recommendation for care management engagement and subsequent re-referral to the care management team.   Weston Settle Care Guide  [facility] Managed  Care Guide [facility]  [phone]

## 2023-07-01 ENCOUNTER — Ambulatory Visit
Admission: RE | Admit: 2023-07-01 | Discharge: 2023-07-01 | Disposition: A | Discharge status: Home / Self Care | Payer: Medicaid Other | Source: Ambulatory Visit | Attending: Neurology | Admitting: Neurology

## 2023-07-01 DIAGNOSIS — R110 Nausea: Secondary | ICD-10-CM

## 2023-07-01 DIAGNOSIS — G43909 Migraine, unspecified, not intractable, without status migrainosus: Secondary | ICD-10-CM | POA: Diagnosis not present

## 2023-07-01 DIAGNOSIS — R519 Headache, unspecified: Secondary | ICD-10-CM | POA: Diagnosis not present

## 2023-07-01 MED ORDER — GADOPICLENOL 0.5 MMOL/ML IV SOLN
10.0000 mL | Freq: Once | INTRAVENOUS | Status: AC | PRN
  Administered 2023-07-01: 8 mL via INTRAVENOUS

## 2023-07-02 DIAGNOSIS — K469 Unspecified abdominal hernia without obstruction or gangrene: Secondary | ICD-10-CM | POA: Diagnosis not present

## 2023-07-02 DIAGNOSIS — R109 Unspecified abdominal pain: Secondary | ICD-10-CM | POA: Diagnosis not present

## 2023-07-04 ENCOUNTER — Telehealth: Payer: Self-pay | Admitting: *Deleted

## 2023-07-04 DIAGNOSIS — K439 Ventral hernia without obstruction or gangrene: Secondary | ICD-10-CM | POA: Diagnosis not present

## 2023-07-04 DIAGNOSIS — K432 Incisional hernia without obstruction or gangrene: Secondary | ICD-10-CM | POA: Diagnosis not present

## 2023-07-04 DIAGNOSIS — Z8719 Personal history of other diseases of the digestive system: Secondary | ICD-10-CM | POA: Diagnosis not present

## 2023-07-04 DIAGNOSIS — Z87891 Personal history of nicotine dependence: Secondary | ICD-10-CM | POA: Diagnosis not present

## 2023-07-04 DIAGNOSIS — Z9049 Acquired absence of other specified parts of digestive tract: Secondary | ICD-10-CM | POA: Diagnosis not present

## 2023-07-04 DIAGNOSIS — Z9884 Bariatric surgery status: Secondary | ICD-10-CM | POA: Diagnosis not present

## 2023-07-04 DIAGNOSIS — Z48815 Encounter for surgical aftercare following surgery on the digestive system: Secondary | ICD-10-CM | POA: Diagnosis not present

## 2023-07-04 DIAGNOSIS — Z9889 Other specified postprocedural states: Secondary | ICD-10-CM | POA: Diagnosis not present

## 2023-07-04 NOTE — Telephone Encounter (Signed)
-----   Message from Amanda Hendricks sent at 07/03/2023  8:19 AM EST ----- Please advise patient that her brain MRI did not show any acute or alarming findings.

## 2023-07-04 NOTE — Telephone Encounter (Signed)
Called pt & LVM (ok per DPR) advising patient's MRI brain did not show any acute or alarming findings. I left our office hours today and phone number for call back if she has any questions. Also advised results are available to see on mychart.

## 2023-07-15 DIAGNOSIS — F3131 Bipolar disorder, current episode depressed, mild: Secondary | ICD-10-CM | POA: Diagnosis not present

## 2023-07-26 DIAGNOSIS — M545 Low back pain, unspecified: Secondary | ICD-10-CM | POA: Diagnosis not present

## 2023-07-26 DIAGNOSIS — M79605 Pain in left leg: Secondary | ICD-10-CM | POA: Diagnosis not present

## 2023-07-26 DIAGNOSIS — M7918 Myalgia, other site: Secondary | ICD-10-CM | POA: Diagnosis not present

## 2023-07-26 DIAGNOSIS — M549 Dorsalgia, unspecified: Secondary | ICD-10-CM | POA: Diagnosis not present

## 2023-07-31 DIAGNOSIS — R0600 Dyspnea, unspecified: Secondary | ICD-10-CM | POA: Diagnosis not present

## 2023-07-31 DIAGNOSIS — I998 Other disorder of circulatory system: Secondary | ICD-10-CM | POA: Diagnosis not present

## 2023-07-31 DIAGNOSIS — G894 Chronic pain syndrome: Secondary | ICD-10-CM | POA: Diagnosis not present

## 2023-07-31 DIAGNOSIS — M47814 Spondylosis without myelopathy or radiculopathy, thoracic region: Secondary | ICD-10-CM | POA: Diagnosis not present

## 2023-07-31 DIAGNOSIS — M25559 Pain in unspecified hip: Secondary | ICD-10-CM | POA: Diagnosis not present

## 2023-07-31 DIAGNOSIS — R6889 Other general symptoms and signs: Secondary | ICD-10-CM | POA: Diagnosis not present

## 2023-07-31 DIAGNOSIS — M791 Myalgia, unspecified site: Secondary | ICD-10-CM | POA: Diagnosis not present

## 2023-07-31 DIAGNOSIS — Z79899 Other long term (current) drug therapy: Secondary | ICD-10-CM | POA: Diagnosis not present

## 2023-07-31 DIAGNOSIS — M47816 Spondylosis without myelopathy or radiculopathy, lumbar region: Secondary | ICD-10-CM | POA: Diagnosis not present

## 2023-07-31 DIAGNOSIS — M5416 Radiculopathy, lumbar region: Secondary | ICD-10-CM | POA: Diagnosis not present

## 2023-08-01 DIAGNOSIS — F3131 Bipolar disorder, current episode depressed, mild: Secondary | ICD-10-CM | POA: Diagnosis not present

## 2023-08-13 DIAGNOSIS — K432 Incisional hernia without obstruction or gangrene: Secondary | ICD-10-CM | POA: Diagnosis not present

## 2023-08-13 DIAGNOSIS — K439 Ventral hernia without obstruction or gangrene: Secondary | ICD-10-CM | POA: Diagnosis not present

## 2023-08-13 NOTE — H&P (Signed)
 GENERAL SURGERY NEW PATIENT NOTE     CHIEF COMPLAINT / REASON FOR REFERRAL: Ventral hernia       REFERRING PHYSICIAN:  self   HISTORY OF PRESENT ILLNESS:  The patient is a 58 y.o. female who is being seen for ventral hernia. She is s/p reversal of gastric bypass and J tube placement in Feb. 2023 which was notable for postoperative abdominal wall hematoma. Since this operation she has recovered well and has undergone J tube removal 04/27/22 and repair of inguinal hernia on 08/20/22. She presents today with concern for ventral hernia which is notable on Ct scan performed on 07/02/2023. She states she has been having intermittent dull pain at the site of her hernia and has noticed a bulge which she manually reduces. She denies episodes of incarceration or strangulation. She has been eating and drinking well. She denies heavy lifting or steroid use. She endorses active nicotine use, reporting 6 cigarettes/day as well as daily vaping.  She endorses daily narcotic use as below for chronic pain. Her medical and surgical history are accurate as listed below.  She denies inguinal hernia recurrence.   CURRENT MEDICAL CONDITIONS: Problem List     Patient Active Problem List  Diagnosis  . HTN (hypertension)  . Dyslipidemia, unspecified  . Family hx of colon cancer requiring screening colonoscopy  . Degenerative arthritis of knee  . Bipolar I disorder (CMS/HHS-HCC)  . Adrenal adenoma  . Multiple thyroid nodules  . Cushing's syndrome (CMS/HHS-HCC)  . Tobacco use disorder  . History of Papillary microcarcinoma of thyroid (CMS-HCC)  . Chronic bronchitis with emphysema , unspecified (CMS-HCC)  . S/P gastric bypass  . GAD (generalized anxiety disorder)  . Spondylolisthesis of lumbosacral region  . Lumbar spondylosis  . Loss of consciousness (CMS/HHS-HCC)  . History of myocardial infarction  . NSVT (nonsustained ventricular tachycardia)  . Chronic pain  . Abdominal pain, epigastric  . Postprandial nausea   . History of compression fracture of spine  . 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 Presumed Coronary artery disease involving native coronary artery of native heart without angina pectoris  . Left ureteral calculus  . History of syncope  . OSA (obstructive sleep apnea)  . Nausea and vomiting  . Weight loss, unintentional  . BMI less than 19,adult  . Age-related osteoporosis with current pathological fracture with routine healing  . Hypotension due to hypovolemia  . Closed left hip fracture, sequela  . Left leg swelling  . Closed avulsion fracture of greater trochanter of femur, left, initial encounter (CMS/HHS-HCC)  . Aftercare following left hip joint replacement surgery  . Recurrent dislocation of left hip  . NASH (nonalcoholic steatohepatitis)  . Nicotine abuse  . Marginal ulcers  . Acute upper gastrointestinal hemorrhage  . Ischemia, bowel (CMS/HHS-HCC)  . GERD (gastroesophageal reflux disease)  . Acute respiratory failure with hypoxia (CMS/HHS-HCC)  . Severe protein-calorie malnutrition (CMS/HHS-HCC)  . Recent pneumonia  . Anastomotic stricture of gastrojejunostomy  . Gastric stenosis  . Esophageal stricture  . Drop in hemoglobin  . History of migraine headaches  . Marginal ulcer  . Jejunostomy tube present (CMS/HHS-HCC)  . Status post gastric surgery  . Status post incision and drainage  . Jejunostomy site infection (CMS/HHS-HCC)  . Abdominal wall abscess  . Hypothyroidism, adult  . Sepsis without acute organ dysfunction (CMS/HHS-HCC)  . Pneumonia of both lungs due to infectious organism  . S/P small bowel resection  . Wound infection  . Pulmonary hypertension, mild (CMS/HHS-HCC)  .  History of right thyroidectomy  . Trochanteric bursitis of left hip  . Status post bariatric surgery  . S/P inguinal hernia repair  . Abdominal hernia without obstruction and without gangrene        MEDICATIONS: Current Medications        Current  Outpatient Medications  Medication Sig Dispense Refill  . acetaminophen  (TYLENOL ) 325 MG tablet Take 2 tablets (650 mg total) by mouth every 6 (six) hours as needed for Pain 30 tablet 0  . atorvastatin  (LIPITOR) 10 MG tablet Take 1 tablet (10 mg total) by mouth once daily 30 tablet 0  . levothyroxine  (SYNTHROID ) 112 MCG tablet Take 1 tablet (112 mcg total) by mouth every morning before breakfast Take on an empty stomach with a glass of water  at least 30-60 minutes before breakfast. 30 tablet 11  . NARCAN 4 mg/actuation nasal spray Place 4 mg into one nostril once for over dose. Repeat in there nostril in 3 minutes, if no response, call 911. 1 each 0  . oxyCODONE  (ROXICODONE ) 15 MG immediate release tablet Take 15 mg by mouth every 4 (four) hours as needed for Pain      . pantoprazole  (PROTONIX ) 40 MG DR tablet Take 1 tablet (40 mg total) by mouth 2 (two) times daily before meals 30 tablet 11  . QUEtiapine  (SEROQUEL ) 300 MG tablet Take 1 tablet (300 mg total) by mouth at bedtime for 30 days 30 tablet 0  . DULoxetine  (CYMBALTA ) 30 MG DR capsule Take 1 capsule (30 mg total) by mouth once daily      . foam bandage 4 X 10  Bndg Apply 1 each topically once daily 30 each 1  . HYDROmorphone  (EXALGO ) 12 mg ER tablet Take 8 mg by mouth once daily (Patient not taking: Reported on 09/05/2022)      . iodoform (CURITY IODOFORM PACKING STRIP) 1/2 X 5 -yard [employer] Apply 1 each topically 2 (two) times daily 1 each 1  . metoprolol  succinate (TOPROL -XL) 25 MG XL tablet Take 1 tablet (25 mg total) by mouth once daily 30 tablet 11  . silver (SILVASORB) gel Apply topically once daily 500 mL 1  . traZODone  (DESYREL ) 50 MG tablet Take 2 tablets (100 mg total) by mouth at bedtime as needed for Sleep 20 tablet 0    No current facility-administered medications for this visit.        PAST MEDICAL HISTORY:  Past Medical History      Past Medical History:  Diagnosis Date  . Acute myocardial infarction of anterolateral  wall, initial episode of care (CMS/HHS-HCC)    . Bipolar illness (CMS/HHS-HCC)    . Chronic bronchitis with emphysema    . Chronic pain 08/13/2019    [facility], provider Norvel Haddock, MD, prescribes narcotics  . Chronic, continuous use of opioids    . Cushing's syndrome (CMS/HHS-HCC) 07/25/2016  . Degenerative disc disease, lumbar    . Eczema, unspecified    . Family hx of colon cancer    . GAD (generalized anxiety disorder) 03/28/2017  . Gastric bypass status for obesity 02/2014  . GERD (gastroesophageal reflux disease)    . History of blood transfusion    . History of myocardial infarction      apical MI with no ischemia by Lexiscan Cardiolite study 7/20  . History of stomach ulcers    . History of syncope 01/20/2021  . Hyperlipidemia    . Hypertension    . Hyponatremia 12/30/2020  . Left ureteral calculus 01/20/2021  .  MRSA (methicillin resistant Staphylococcus aureus) 05/02/2022  . NASH (nonalcoholic steatohepatitis) 06/08/2021  . Nausea and vomiting 01/20/2021  . NSVT (nonsustained ventricular tachycardia) (CMS/HHS-HCC)    . Obesity      peak wt 317 lbs pre gastric bypass surgery  . Poor intravenous access    . Postprandial nausea 08/28/2019  . Presumed Coronary artery disease involving native coronary artery of native heart without angina pectoris 01/20/2021  . Pulmonary hypertension, mild (CMS/HHS-HCC) 08/10/2022  . Sleep apnea      No longer requiring CPAP after weight loss  . Smoker    . Status post incision and drainage 10/21/2021  . Syncope and collapse    . Thyroid cancer (CMS/HHS-HCC) 07/2016  . Wound infection 05/25/2022        PAST SURGICAL HISTORY:  Past Surgical History       Past Surgical History:  Procedure Laterality Date  . BREAST BIOPSY Right 2005  . bariatric    02/2014    weight loss surgery at [facility] 02/2014-roux en y  . COLONOSCOPY W/REMOVAL LESIONS BY SNARE N/A 08/18/2014    Procedure: COLONOSCOPY, FLEXIBLE; WITH REMOVAL OF TUMOR(S),  POLYP(S), OR OTHER LESION(S) BY SNARE TECHNIQUE;  Surgeon: Charlie Franky Debarah Mickey., MD;  Location: [facility];  Service: Gastroenterology;  Laterality: N/A;  . COLONOSCOPY W/BIOPSY N/A 08/24/2015    Procedure: SURVEILLANCE COLONOSCOPY;  Surgeon: Charlie Franky Debarah Mickey., MD;  Location: [facility];  Service: Gastroenterology;  Laterality: N/A;  . COLONOSCOPY W/REMOVAL LESIONS BY SNARE N/A 08/24/2015    Procedure: COLONOSCOPY, FLEXIBLE; WITH REMOVAL OF TUMOR(S), POLYP(S), OR OTHER LESION(S) BY SNARE TECHNIQUE;  Surgeon: Charlie Franky Debarah Mickey., MD;  Location: [facility];  Service: Gastroenterology;  Laterality: N/A;  . LOBECTOMY TOTAL THYROID Right 06/11/2016    Procedure: LOBECTOMY TOTAL THYROID;  Surgeon: Abbie Alfornia Lucks, MD;  Location: [facility] OR;  Service: General Surgery;  Laterality: Right;  . MONITORING CRANIAL NERVES UNILATERAL N/A 06/11/2016    Procedure: MONITORING CRANIAL NERVES UNILATERAL w/NIMS;  Surgeon: Abbie Alfornia Lucks, MD;  Location: [facility] OR;  Service: General Surgery;  Laterality: N/A;  . LAPAROSCOPIC ADRENALECTOMY   2018  . ESOPHAGOGASTRODOUDENOSCOPY W/BIOPSY   11/26/2016    Procedure: ESOPHAGOGASTRODUODENOSCOPY, FLEXIBLE, TRANSORAL; WITH BIOPSY, SINGLE OR MULTIPLE;  Surgeon: Alberteen Lynwood Osier, MD;  Location: [facility];  Service: Gastroenterology;;  . OTHER SURGICAL HISTORY   04/30/2019    ST Jude Internal Loop recorder implanted  . ESOPHAGOGASTRODOUDENOSCOPY W/BIOPSY N/A 06/15/2019    Procedure: DIAGNOSTIC ESOPHAGOGASTRODUODENOSCOPY;  Surgeon: Geraldean Begun, MD;  Location: [facility];  Service: Gastroenterology;  Laterality: N/A;  . ESOPHAGOGASTRODOUDENOSCOPY W/REMOVAL FOREIGN BODY N/A 06/15/2019    Procedure: ESOPHAGOGASTRODUODENOSCOPY, FLEXIBLE, TRANSORAL; WITH REMOVAL OF FOREIGN BODY(S);  Surgeon: Geraldean Begun, MD;  Location: [facility];  Service: Gastroenterology;  Laterality: N/A;  .  ESOPHAGOGASTRODOUDENOSCOPY W/BIOPSY N/A 09/21/2019    Procedure: DIAGNOSTIC ESOPHAGOGASTRODUODENOSCOPY;  Surgeon: Gaile Claretta Denmark, MD;  Location: [facility];  Service: Gastroenterology;  Laterality: N/A;  . ESOPHAGOGASTRODOUDENOSCOPY W/DILITATION GASTRIC OUTLET N/A 11/19/2019    Procedure: DIAGNOSTIC ESOPHAGOGASTRODUODENOSCOPY;  Surgeon: Willma Morene Shad, MD;  Location: [facility];  Service: Gastroenterology;  Laterality: N/A;  . EGD N/A 03/31/2020    Procedure: DIAGNOSTIC EGD;  Surgeon: Willma Morene Shad, MD;  Location: [facility];  Service: Gastroenterology;  Laterality: N/A;  . OTHER SURGERY   12/06/2020    T10 vertebral body biopsy in Interventional radiology  . CYSTOURETHROSCOPY W/URETEROSCOPY &/OR PYELOSCOPY W/LITHOTRIPSY Left 02/09/2021  Procedure: Cystoscopy/Left Ureteroscopy with Laser/Left Ureteral Stent;Left Ureteral dilation;  Surgeon: Marilyn Selinda Charleston, MD;  Location: [facility] OR;  Service: Urology;  Laterality: Left;  . INTRAOPERATIVE FLUOROSCOPY Left 02/09/2021    Procedure: FLUOROSCOPY;  Surgeon: Marilyn Selinda Charleston, MD;  Location: [facility] OR;  Service: Urology;  Laterality: Left;  . total hip arthroplasty Left 04/08/2021  . ARTHROTOMY HIP Left 06/15/2021    Procedure: ARTHROTOMY, HIP, INCLUDING EXPLORATION OR REMOVAL OF LOOSE OR FOREIGN BODY - head & liner exchange and abductor repair;  Surgeon: Taft Jayson Lenis, MD;  Location: [facility] OR;  Service: Orthopedics;  Laterality: Left;  . OPEN REDUCTION GREATER TROCHANTER FRACTURE Left 06/15/2021    Procedure: OPEN TREATMENT OF GREATER TROCHANTERIC FRACTURE, INCLUDES INTERNAL FIXATION, WHEN PERFORMED;  Surgeon: Taft Jayson Lenis, MD;  Location: [facility] OR;  Service: Orthopedics;  Laterality: Left;  . REVISION TOTAL HIP ARTHROPLASTY Left 06/15/2021    Procedure: REVISION OF TOTAL HIP ARTHROPLASTY; BOTH COMPONENTS, WITH OR WITHOUT AUTOGRAFT OR ALLOGRAFT;  Surgeon: Taft Jayson Lenis,  MD;  Location: [facility] OR;  Service: Orthopedics;  Laterality: Left;  . INTRAOPERATIVE FLUOROSCOPY Left 06/15/2021    Procedure: FLUOROSCOPY;  Surgeon: Taft Jayson Lenis, MD;  Location: [facility] OR;  Service: Orthopedics;  Laterality: Left;  . SBE   06/19/2021    Procedure: SMALL INTESTINAL ENDOSCOPY, ENTEROSCOPY BEYOND SECOND PORTION OF DUODENUM, NOT INCLUDING ILEUM, DIAGNOSTIC, INCLUDING COLLECTION OF SPECIMEN(S) BY BRUSHING OR WASHING, WHEN PERFORMED;  Surgeon: Louann Lenis Hora, MD;  Location: [facility];  Service: Gastroenterology;;  . CORI W/CONTROL BLEEDING N/A 07/06/2021    Procedure: ESOPHAGOGASTRODUODENOSCOPY, FLEXIBLE, TRANSORAL; WITH CONTROL OF BLEEDING, ANY METHOD;  Surgeon: Tobie Lavada Douse, MD;  Location: [facility];  Service: Gastroenterology;  Laterality: N/A;  . EGD N/A 07/20/2021    Procedure: Upper Endoscopy;  Surgeon: Russella Missy Bers, MD;  Location: [facility];  Service: Gastroenterology;  Laterality: N/A;  Schedule in MAC Block  . ESOPHAGOGASTRODOUDENOSCOPY W/DILITATION GASTRIC OUTLET N/A 09/18/2021    Procedure: ESOPHAGOGASTRODUODENOSCOPY, FLEXIBLE, TRANSORAL; DIAGNOSTIC;  Surgeon: Rosalina Levorn Dragon, DO;  Location: [facility] OR;  Service: General Surgery;  Laterality: N/A;  . LAPAROSCOPY DIAGNOSTIC N/A 10/09/2021    Procedure: LAPAROSCOPY, SURGICAL; ABDOMEN, PERITONEUM, AND OMENTUM, DIAGNOSTIC, WITH OR WITHOUT COLLECTION OF SPECIMEN(S) BY BRUSHING OR WASHING (SEPARATE PROCEDURE);  Surgeon: Rosalina Levorn Dragon, DO;  Location: [facility] OR;  Service: General Surgery;  Laterality: N/A;  . REVISION GASTROJEJUNAL ANASTAMOSIS N/A 10/09/2021    Procedure: REVISION OF GASTROJEJUNAL ANASTOMOSIS (GASTROJEJUNOSTOMY) WITH RECONSTRUCTION, WITH OR WITHOUT PARTIAL GASTRECTOMY OR INTESTINE RESECTION; WITHOUT VAGOTOMY;  Surgeon: Rosalina Levorn Dragon, DO;  Location: [facility] OR;  Service: General Surgery;  Laterality: N/A;  possible open, possible G tube  . EGD N/A  10/09/2021    Procedure: ESOPHAGOGASTRODUODENOSCOPY, FLEXIBLE, TRANSORAL; DIAGNOSTIC, INCLUDING COLLECTION OF SPECIMEN(S) BY BRUSHING OR WASHING, WHEN PERFORMED (SEPARATE PROCEDURE);  Surgeon: Rosalina Levorn Dragon, DO;  Location: [facility] OR;  Service: General Surgery;  Laterality: N/A;  . LAPAROSCOPY DIAGNOSTIC N/A 10/11/2021    Procedure: LAPAROSCOPY, SURGICAL; ABDOMEN, PERITONEUM, AND OMENTUM, DIAGNOSTIC, omental patch, Incision and drainage abdominal wall.;  Surgeon: Rosalina Levorn Dragon, DO;  Location: [facility] OR;  Service: General Surgery;  Laterality: N/A;  possible open  . EGD N/A 10/11/2021    Procedure: ESOPHAGOGASTRODUODENOSCOPY, FLEXIBLE, TRANSORAL; DIAGNOSTIC, INCLUDING COLLECTION OF SPECIMEN(S) BY BRUSHING OR WASHING, WHEN PERFORMED (SEPARATE PROCEDURE);  Surgeon: Rosalina Levorn Dragon, DO;  Location: [facility] OR;  Service: General Surgery;  Laterality: N/A;  . INCISION & DRAINAGE SEROMA ABDOMEN N/A 10/21/2021    Procedure: INCISION  AND DRAINAGE OF HEMATOMA, SEROMA OR FLUID COLLECTION; ABDOMEN;  Surgeon: Rosalina Levorn Dragon, DO;  Location: [facility] OR;  Service: General Surgery;  Laterality: N/A;  . LAPAROSCOPIC SMALL BOWEL RESECTION & ANASTAMOSIS N/A 04/27/2022    Procedure: LAPAROSCOPY, SURGICAL; ENTERECTOMY, RESECTION OF SMALL INTESTINE, SINGLE RESECTION AND ANASTOMOSIS;  Surgeon: Rosalina Levorn Dragon, DO;  Location: [facility] OR;  Service: General Surgery;  Laterality: N/A;  laparoscopic small bowel resection, possible open  . LAPAROSCOPIC REPAIR INGUINAL HERNIA Right 08/20/2022    Procedure: LAPAROSCOPY, SURGICAL; REPAIR INITIAL INGUINAL HERNIA WITH MESH;  Surgeon: Rosalina Levorn Dragon, DO;  Location: [facility] OR;  Service: General Surgery;  Laterality: Right;  . ankle surgery Right      fracture- approx age 53  . CHOLECYSTECTOMY      . HYSTERECTOMY        approx age 60 yo  . HYSTERECTOMY VAGINAL      . TONSILLECTOMY            FAMILY HISTORY:  Family History        Family History  Problem Relation Name Age of  Onset  . Ovarian cancer Mother      . Anxiety Mother      . Depression Mother      . Thyroid nodules Mother      . Osteopenia Mother      . Colon polyps Mother      . Alcohol abuse Father      . Parkinsonism Father      . Coronary Artery Disease (Blocked arteries around heart) Father      . No Known Problems Brother      . Breast cancer Paternal Aunt      . No Known Problems Son      . Depression Son      . Breast cancer Paternal Aunt      . Colon cancer Maternal Grandmother      . Coronary Artery Disease (Blocked arteries around heart) Maternal Grandmother      . Anesthesia problems Neg Hx      . Malignant hypertension Neg Hx      . Myocardial Infarction (Heart attack) Neg Hx      . Sudden death (unexpected death due to unknown cause) Neg Hx            SOCIAL HISTORY:  Social History  Social History         Socioeconomic History  . Marital status: Legally Separated      Spouse name: GORDAN  . Number of children: 2  Occupational History  . 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 Smoking status: Former      Current packs/day: 0.00      Average packs/day: 0.5 packs/day for 36.0 years (18.0 ttl pk-yrs)      Types: Cigarettes      Start date: 08/13/1986      Quit date: 08/02/2022      Years since quitting: 0.9  . Smokeless tobacco: Never  . Tobacco comments:      Smokes 4 cigarettes a day  Vaping Use  . Vaping status: Never Used  Substance and Sexual Activity  . Alcohol use: Not Currently  . Drug use: No  . Sexual activity: Yes      Partners: Male      Birth control/protection: Post-menopausal    Social Drivers of Health        Financial Resource Strain: Medium Risk (04/13/2022)  Overall Physicist, Medical Strain (CARDIA)    . Difficulty of Paying Living Expenses: Somewhat hard  Food Insecurity: Food Insecurity Present (07/31/2022)    Received from [facility] [place]    Hunger Vital Sign    . Worried About Programme Researcher, Broadcasting/film/video in the  Last Year: Sometimes true    . Ran Out of Food in the Last Year: Never true  Transportation Needs: Unmet Transportation Needs (07/31/2022)    Received from [facility] [place]    [facility]    . Lack of Transportation (Medical): Yes    . Lack of Transportation (Non-Medical): Yes    [facility]    Social Network        REVIEW OF SYSTEMS:  GENERAL: Denies fevers, chills or malaise. SKIN: Denies rashes, sores. HEAD: Denies headache, migraine. EYES: Denies vision change, diploplia. RESPIRATORY: Denies cough, sputum, hemoptysis, TB, pneumonia. CARDIAC: Denies chest pain, palpitations, dyspnea, orthopnea, heart trouble. GI: Denies heartburn, nausea, dyspepsia, vomiting, vomiting of blood, changes in bowel habits, rectal bleeding or black/tarry stools, diarrhea, constipation. URINARY: Denies polyuria, dysuria, nocturia, hesitancy, incontinence, infections, reduced force or stream. VASCULAR: Denies intermittent claudication, leg cramps, thrombophlebitis. MS: Denies gout, back pain, systemic arthritis. NEUROLOGIC: Denies fainting, blackouts, seizures, paralysis, tremors, numbness, gait change. HEMATOLOGIC: Denies easy bruising or bleeding, past transfusion reactions.   PHYSICAL EXAMINATION:  Ht:170.2 cm (5' 7) Wt:73.2 kg (161 lb 6.4 oz) ADJ:Anib surface area is 1.86 meters squared. Body mass index is 25.28 kg/m.    BP (!) 181/107 (BP Location: Left upper arm, Patient Position: Sitting, BP Cuff Size: Adult)   Pulse 85   Ht 170.2 cm (5' 7)   Wt 73.2 kg (161 lb 6.4 oz)   SpO2 100%   BMI 25.28 kg/m    GENERAL: adult female in no apparent distress.  NEURO: Alert and oriented x3, mood and affect appropriate. HEENT: Pupils equal. EOMs grossly normal.  LUNGS: Good air entry ABDOMEN:soft, multiple well healed surgical incisions. Ventral hernia is present which is mildly tender and easily reducible. EXTREMITIES: No venous stasis changes.      CT 07/03/23  Impression: There is a small fat and small bowel containing hernia along the right anterior abdominal wall with hernia defect through the right rectus abdominis muscle. No obstruction.   Electronically Signed by:  Alyce Prime, MD Electronically Signed on:  07/03/2023 9:00 AM   Problem List Items Addressed This Visit              FEN/GI    S/P small bowel resection    Status post bariatric surgery    Abdominal hernia without obstruction and without gangrene - Primary    Relevant Orders    Nicotine & Metabolites, Urine        Surgery/Wound/Pain    S/P inguinal hernia repair      ASSESSMENT: Amanda Hendricks is a 57 y.o. female with right incisional hernia easily reducible. She does endorse daily tobacco use but not other modifiable risk factors.    PLAN:  We discussed in length regarding treatment options (watchful waiting vs open and laparoscopic repair).    Surgery plan would be Laparoscopic ventral hernia repair with mesh.  I reviewed the EKG and labs.  No further testing is needed.  PAT visit. Outpatient surgery. Hold ASA and ibuprofen 5 days prior to surgery.    Regarding her tobacco use, we have asked her to decrease her tobacco use by 50% prior to surgery.  We will plan to test her urine nicotine prior to surgery.   This included risks and benefits of all options and the patient does want to proceed on surgery at this time. Patient has elected to proceed with surgical treatment. Procedure will be scheduled.  Written consent was obtained.   We discussed the signs of incarceration and the need to present to the ED. We discussed weight lifting restrictions of 10lbs for 2 weeks, then 20lbs for 4 weeks.    Patient is encouraged to call the clinic with any questions or concerns. Patient demonstrates understanding, is amenable to the outlined plan, and there are no barriers to education today.      Attestation Statement:    I personally performed the service.  (TP)   I spent a total of 60 minutes in both face-to-face and non-face-to-face activities for this visit on the date of this encounter.    KERI ANNE SEYMOUR, DO

## 2023-08-15 DIAGNOSIS — R7989 Other specified abnormal findings of blood chemistry: Secondary | ICD-10-CM | POA: Diagnosis not present

## 2023-08-15 DIAGNOSIS — R7303 Prediabetes: Secondary | ICD-10-CM | POA: Diagnosis not present

## 2023-08-15 DIAGNOSIS — C73 Malignant neoplasm of thyroid gland: Secondary | ICD-10-CM | POA: Diagnosis not present

## 2023-08-15 DIAGNOSIS — M8000XD Age-related osteoporosis with current pathological fracture, unspecified site, subsequent encounter for fracture with routine healing: Secondary | ICD-10-CM | POA: Diagnosis not present

## 2023-08-15 DIAGNOSIS — E890 Postprocedural hypothyroidism: Secondary | ICD-10-CM | POA: Diagnosis not present

## 2023-08-15 DIAGNOSIS — Z8781 Personal history of (healed) traumatic fracture: Secondary | ICD-10-CM | POA: Diagnosis not present

## 2023-09-03 DIAGNOSIS — I10 Essential (primary) hypertension: Secondary | ICD-10-CM | POA: Diagnosis not present

## 2023-09-03 DIAGNOSIS — C73 Malignant neoplasm of thyroid gland: Secondary | ICD-10-CM | POA: Diagnosis not present

## 2023-09-03 DIAGNOSIS — E249 Cushing's syndrome, unspecified: Secondary | ICD-10-CM | POA: Diagnosis not present

## 2023-09-03 DIAGNOSIS — Z9049 Acquired absence of other specified parts of digestive tract: Secondary | ICD-10-CM | POA: Diagnosis not present

## 2023-09-03 DIAGNOSIS — M4317 Spondylolisthesis, lumbosacral region: Secondary | ICD-10-CM | POA: Diagnosis not present

## 2023-09-03 DIAGNOSIS — Z9884 Bariatric surgery status: Secondary | ICD-10-CM | POA: Diagnosis not present

## 2023-09-03 DIAGNOSIS — E785 Hyperlipidemia, unspecified: Secondary | ICD-10-CM | POA: Diagnosis not present

## 2023-09-03 DIAGNOSIS — G4733 Obstructive sleep apnea (adult) (pediatric): Secondary | ICD-10-CM | POA: Diagnosis not present

## 2023-09-03 DIAGNOSIS — Z800 Family history of malignant neoplasm of digestive organs: Secondary | ICD-10-CM | POA: Diagnosis not present

## 2023-09-03 DIAGNOSIS — S72002S Fracture of unspecified part of neck of left femur, sequela: Secondary | ICD-10-CM | POA: Diagnosis not present

## 2023-09-03 DIAGNOSIS — Z8781 Personal history of (healed) traumatic fracture: Secondary | ICD-10-CM | POA: Diagnosis not present

## 2023-09-26 ENCOUNTER — Other Ambulatory Visit: Payer: Self-pay

## 2023-09-26 DIAGNOSIS — R109 Unspecified abdominal pain: Secondary | ICD-10-CM | POA: Diagnosis not present

## 2023-09-26 DIAGNOSIS — R1033 Periumbilical pain: Secondary | ICD-10-CM | POA: Diagnosis not present

## 2023-09-26 DIAGNOSIS — R112 Nausea with vomiting, unspecified: Secondary | ICD-10-CM | POA: Diagnosis not present

## 2023-09-26 DIAGNOSIS — R000 Tachycardia, unspecified: Secondary | ICD-10-CM | POA: Diagnosis not present

## 2023-09-26 DIAGNOSIS — D3502 Benign neoplasm of left adrenal gland: Secondary | ICD-10-CM | POA: Diagnosis not present

## 2023-09-26 LAB — URINALYSIS, ROUTINE W REFLEX MICROSCOPIC
Bilirubin Urine: NEGATIVE
Glucose, UA: NEGATIVE mg/dL
Ketones, ur: NEGATIVE mg/dL
Nitrite: NEGATIVE
Protein, ur: NEGATIVE mg/dL
Specific Gravity, Urine: 1.003 — ABNORMAL LOW (ref 1.005–1.030)
pH: 5 (ref 5.0–8.0)

## 2023-09-26 LAB — COMPREHENSIVE METABOLIC PANEL
ALT: 13 U/L (ref 0–44)
AST: 23 U/L (ref 15–41)
Albumin: 2.9 g/dL — ABNORMAL LOW (ref 3.5–5.0)
Alkaline Phosphatase: 191 U/L — ABNORMAL HIGH (ref 38–126)
Anion gap: 14 (ref 5–15)
BUN: 7 mg/dL (ref 6–20)
CO2: 22 mmol/L (ref 22–32)
Calcium: 8.6 mg/dL — ABNORMAL LOW (ref 8.9–10.3)
Chloride: 99 mmol/L (ref 98–111)
Creatinine, Ser: 0.73 mg/dL (ref 0.44–1.00)
GFR, Estimated: >60 mL/min (ref >60)
Glucose, Bld: 94 mg/dL (ref 70–99)
Potassium: 3.8 mmol/L (ref 3.5–5.1)
Sodium: 135 mmol/L (ref 135–145)
Total Bilirubin: 0.5 mg/dL (ref 0.0–1.2)
Total Protein: 6.8 g/dL (ref 6.5–8.1)

## 2023-09-26 LAB — LIPASE, BLOOD: Lipase: 24 U/L (ref 11–51)

## 2023-09-26 LAB — CBC
HCT: 39.3 % (ref 36.0–46.0)
Hemoglobin: 12.3 g/dL (ref 12.0–15.0)
MCH: 28.9 pg (ref 26.0–34.0)
MCHC: 31.3 g/dL (ref 30.0–36.0)
MCV: 92.3 fL (ref 80.0–100.0)
Platelets: 306 10*3/uL (ref 150–400)
RBC: 4.26 MIL/uL (ref 3.87–5.11)
RDW: 14.6 % (ref 11.5–15.5)
WBC: 7.2 10*3/uL (ref 4.0–10.5)
nRBC: 0 % (ref 0.0–0.2)

## 2023-09-26 NOTE — ED Triage Notes (Signed)
Patient presents to ED POV for abdominal pain, nausea and vomiting,  started four days ago,  extensive history of abdominal surgeries,  last hernia surgery was December of 2024

## 2023-09-27 ENCOUNTER — Emergency Department
Admission: EM | Admit: 2023-09-27 | Discharge: 2023-09-27 | Disposition: A | Discharge status: Home / Self Care | Payer: Medicaid Other | Attending: Emergency Medicine | Admitting: Emergency Medicine

## 2023-09-27 ENCOUNTER — Emergency Department: Payer: Medicaid Other

## 2023-09-27 DIAGNOSIS — R1033 Periumbilical pain: Secondary | ICD-10-CM

## 2023-09-27 DIAGNOSIS — R112 Nausea with vomiting, unspecified: Secondary | ICD-10-CM

## 2023-09-27 DIAGNOSIS — D3502 Benign neoplasm of left adrenal gland: Secondary | ICD-10-CM | POA: Diagnosis not present

## 2023-09-27 DIAGNOSIS — R109 Unspecified abdominal pain: Secondary | ICD-10-CM | POA: Diagnosis not present

## 2023-09-27 LAB — TROPONIN I (HIGH SENSITIVITY): Troponin I (High Sensitivity): 5 ng/L (ref <18)

## 2023-09-27 MED ORDER — ONDANSETRON HCL 4 MG/2ML IJ SOLN
4.0000 mg | Freq: Once | INTRAMUSCULAR | Status: AC
  Administered 2023-09-27: 4 mg via INTRAVENOUS
  Filled 2023-09-27: qty 2

## 2023-09-27 MED ORDER — IOHEXOL 300 MG/ML  SOLN
100.0000 mL | Freq: Once | INTRAMUSCULAR | Status: AC | PRN
  Administered 2023-09-27: 100 mL via INTRAVENOUS

## 2023-09-27 MED ORDER — MAALOX 600 MG PO CHEW: 600.0000 mg | CHEWABLE_TABLET | Freq: Two times a day (BID) | ORAL | 0 refills | Status: AC | PRN

## 2023-09-27 MED ORDER — ALUM & MAG HYDROXIDE-SIMETH 200-200-20 MG/5ML PO SUSP
30.0000 mL | Freq: Once | ORAL | Status: AC
  Administered 2023-09-27: 30 mL via ORAL
  Filled 2023-09-27: qty 30

## 2023-09-27 MED ORDER — LIDOCAINE VISCOUS HCL 2 % MT SOLN
15.0000 mL | Freq: Once | OROMUCOSAL | Status: AC
  Administered 2023-09-27: 15 mL via ORAL
  Filled 2023-09-27: qty 15

## 2023-09-27 MED ORDER — LACTATED RINGERS IV BOLUS
1000.0000 mL | Freq: Once | INTRAVENOUS | Status: AC
  Administered 2023-09-27: 1000 mL via INTRAVENOUS

## 2023-09-27 MED ORDER — MORPHINE SULFATE (PF) 2 MG/ML IV SOLN
2.0000 mg | Freq: Once | INTRAVENOUS | Status: AC
  Administered 2023-09-27: 2 mg via INTRAVENOUS
  Filled 2023-09-27: qty 1

## 2023-09-27 NOTE — ED Provider Notes (Signed)
[facility] Provider Note    Event Date/Time   First MD Initiated Contact with Patient 09/27/23 [phone]     (approximate)   History   Abdominal Pain, Nausea, and Vomiting   HPI  Amanda Hendricks is a 58 y.o. 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  States that she had hernia repair done late December at [facility].  Has not followed up with her surgeon.  Has noted some burning to the periumbilical region that radiates down to her right lower quadrant.  Also with nausea vomiting even though she does state that she does tend to be nauseous intermittently.  No fevers.  No blood in her stool, no melena.  Is passing gas.  Last bowel movement was yesterday.  No chest pain or shortness of breath or cough, no urinary symptoms or back pain.  Denies heavy lifting, did not note any tearing.  States that she is been having some pain since surgery but got worse yesterday.   On independent review she had her surgery done on the 31st.  It was a laparoscopic ventral hernia repair with mesh.  Physical Exam   Triage Vital Signs: ED Triage Vitals  Encounter Vitals Group     BP 09/26/23 2111 (!) 173/105     Systolic BP Percentile --      Diastolic BP Percentile --      Pulse Rate 09/26/23 2111 (!) 126     Resp 09/26/23 2111 20     Temp 09/26/23 2111 99.5 F (37.5 C)     Temp Source 09/26/23 2111 Oral     SpO2 09/26/23 2111 99 %     Weight 09/26/23 2112 149 lb (67.6 kg)     Height 09/26/23 2112 5\' 6"  (1.676 m)     Head Circumference --      Peak Flow --      Pain Score 09/26/23 2111 8     Pain Loc --      Pain Education --      Exclude from Growth Chart --     Most recent vital signs: Vitals:   09/26/23 2111 09/27/23 0122  BP: (!) 173/105 (!) 158/105   Pulse: (!) 126 (!) 102  Resp: 20   Temp: 99.5 F (37.5 C)   SpO2: 99%      General: Awake, no distress.  CV:  Good peripheral perfusion.  Resp:  Normal effort.  Abd:  No distention.  Tender to the periumbilical region, able to palpate a small defect, did not have to reduce any intestinal content.  Surgical sites are intact, there is no purulence, no fluctuance or induration, no erythema. Other:  No CVA tenderness   ED Results / Procedures / Treatments   Labs (all labs ordered are listed, but only abnormal results are displayed) Labs Reviewed  COMPREHENSIVE METABOLIC PANEL - Abnormal; Notable for the following components:      Result Value   Calcium 8.6 (*)    Albumin 2.9 (*)    Alkaline Phosphatase 191 (*)    All other components within normal limits  URINALYSIS, ROUTINE W REFLEX MICROSCOPIC - Abnormal; Notable for the following components:   Color, Urine STRAW (*)    APPearance HAZY (*)    Specific Gravity, Urine 1.003 (*)    Hgb urine dipstick SMALL (*)  Leukocytes,Ua LARGE (*)    Bacteria, UA RARE (*)    All other components within normal limits  LIPASE, BLOOD  CBC  TROPONIN I (HIGH SENSITIVITY)  TROPONIN I (HIGH SENSITIVITY)     EKG  EKG shows sinus tachycardia, rate 121, normal QS, normal QTc, T wave flattening V3, no ischemic ST elevation, not significant change compared to prior   RADIOLOGY CT on my interpretation without obvious obstruction   PROCEDURES:  Critical Care performed: No  Procedures   MEDICATIONS ORDERED IN ED: Medications  lactated ringers bolus 1,000 mL (0 mLs Intravenous Stopped 09/27/23 0636)  morphine (PF) 2 MG/ML injection 2 mg (2 mg Intravenous Given 09/27/23 0423)  ondansetron (ZOFRAN) injection 4 mg (4 mg Intravenous Given 09/27/23 0422)  iohexol (OMNIPAQUE) 300 MG/ML solution 100 mL (100 mLs Intravenous Contrast Given 09/27/23 0440)  alum & mag hydroxide-simeth (MAALOX/MYLANTA) 200-200-20 MG/5ML suspension 30 mL (30 mLs Oral  Given 09/27/23 0636)    And  lidocaine (XYLOCAINE) 2 % viscous mouth solution 15 mL (15 mLs Oral Given 09/27/23 0636)     IMPRESSION / MDM / ASSESSMENT AND PLAN / ED COURSE  I reviewed the triage vital signs and the nursing notes.                              Differential diagnosis includes, but is not limited to, SBO, appendicitis, colitis, recurrence of her ventral hernia, gastroenteritis, GERD, peptic ulcer disease, dehydration.  No urinary symptoms suggest UTI.  Get a CT, get labs, given the nausea vomiting, also get an EKG as well as troponins as this could be atypical ACS as well.  Reassess.  Will give her some IV fluids, some IV morphine as well as Zofran.  Patient's presentation is most consistent with acute presentation with potential threat to life or bodily function.  Independent interpretation of labs and imaging below. Pt is safe for outpatient managagement. Suspect symptoms might be related to gastritis giving the burning sensation and N/V. Will discharge with prescription for maalox. Instructed her to followup with her surgeon and PCP, she might need referral to GI via her PCP if sxs persists. Discharged with strict return precautions.   Clinical Course as of 09/27/23 1636  Fri Sep 27, 2023  0540 CT ABDOMEN PELVIS W CONTRAST IMPRESSION: 1. No acute findings are noted in the abdomen or pelvis to account for the patient's symptoms. Specifically, no evidence of bowel obstruction. 2. Aortic atherosclerosis, in addition to left main and left anterior descending coronary artery disease. 3. Postoperative changes and incidental findings, as above.  Aortic Atherosclerosis (ICD10-I70.0).   [TT]  0540 Independent review of labs, no leukocytosis, electrolytes not severely deranged, creatinine is normal, UA not consistent with UTI, troponin is negative.  CT did not show any acute pathology. [TT]  [id number] On reassessment patient is feeling better, has a little bit of the burning still but  no nausea vomiting.  Will give her a GI cocktail.  Discussed results with patient, recommended that she follow-up with her surgeon as well as her primary care doctor.  If the GI cocktail is working, she can take Maalox at home.  If she has persistent symptoms she may need a referral to gastroenterology through her primary care doctor.  Patient is agreeable with the plan. [TT]    Clinical Course User Index [TT] Jodie Echevaria Franchot Erichsen, MD     FINAL CLINICAL IMPRESSION(S) / ED DIAGNOSES   Final diagnoses:  Periumbilical abdominal pain  Nausea and vomiting, unspecified vomiting type     Rx / DC Orders   ED Discharge Orders          Ordered    Calcium Carbonate Antacid (MAALOX) 600 MG chewable tablet  2 times daily PRN        09/27/23 0630             Note:  This document was prepared using Dragon voice recognition software and may include unintentional dictation errors.    Claybon Jabs, MD 09/27/23 [phone]

## 2023-10-01 DIAGNOSIS — F3131 Bipolar disorder, current episode depressed, mild: Secondary | ICD-10-CM | POA: Diagnosis not present

## 2023-10-09 DIAGNOSIS — C73 Malignant neoplasm of thyroid gland: Secondary | ICD-10-CM | POA: Diagnosis not present

## 2023-10-09 DIAGNOSIS — M8000XD Age-related osteoporosis with current pathological fracture, unspecified site, subsequent encounter for fracture with routine healing: Secondary | ICD-10-CM | POA: Diagnosis not present

## 2023-10-09 DIAGNOSIS — E890 Postprocedural hypothyroidism: Secondary | ICD-10-CM | POA: Diagnosis not present

## 2023-10-09 DIAGNOSIS — E559 Vitamin D deficiency, unspecified: Secondary | ICD-10-CM | POA: Diagnosis not present

## 2023-10-09 DIAGNOSIS — Z8781 Personal history of (healed) traumatic fracture: Secondary | ICD-10-CM | POA: Diagnosis not present

## 2023-10-09 DIAGNOSIS — R7303 Prediabetes: Secondary | ICD-10-CM | POA: Diagnosis not present

## 2023-10-09 DIAGNOSIS — R7989 Other specified abnormal findings of blood chemistry: Secondary | ICD-10-CM | POA: Diagnosis not present

## 2023-10-23 DIAGNOSIS — R7989 Other specified abnormal findings of blood chemistry: Secondary | ICD-10-CM | POA: Diagnosis not present

## 2023-10-24 DIAGNOSIS — R7989 Other specified abnormal findings of blood chemistry: Secondary | ICD-10-CM | POA: Diagnosis not present

## 2023-10-25 DIAGNOSIS — E249 Cushing's syndrome, unspecified: Secondary | ICD-10-CM | POA: Diagnosis not present

## 2023-10-25 DIAGNOSIS — M8000XD Age-related osteoporosis with current pathological fracture, unspecified site, subsequent encounter for fracture with routine healing: Secondary | ICD-10-CM | POA: Diagnosis not present

## 2023-10-25 DIAGNOSIS — R7989 Other specified abnormal findings of blood chemistry: Secondary | ICD-10-CM | POA: Diagnosis not present

## 2023-11-04 DIAGNOSIS — M8000XD Age-related osteoporosis with current pathological fracture, unspecified site, subsequent encounter for fracture with routine healing: Secondary | ICD-10-CM | POA: Diagnosis not present

## 2023-11-04 DIAGNOSIS — Z780 Asymptomatic menopausal state: Secondary | ICD-10-CM | POA: Diagnosis not present

## 2023-11-04 DIAGNOSIS — E890 Postprocedural hypothyroidism: Secondary | ICD-10-CM | POA: Diagnosis not present

## 2023-11-04 DIAGNOSIS — C73 Malignant neoplasm of thyroid gland: Secondary | ICD-10-CM | POA: Diagnosis not present

## 2023-11-04 DIAGNOSIS — Z8781 Personal history of (healed) traumatic fracture: Secondary | ICD-10-CM | POA: Diagnosis not present

## 2023-11-04 DIAGNOSIS — Z9089 Acquired absence of other organs: Secondary | ICD-10-CM | POA: Diagnosis not present

## 2023-11-04 DIAGNOSIS — M810 Age-related osteoporosis without current pathological fracture: Secondary | ICD-10-CM | POA: Diagnosis not present

## 2023-11-06 DIAGNOSIS — R6889 Other general symptoms and signs: Secondary | ICD-10-CM | POA: Diagnosis not present

## 2023-11-06 DIAGNOSIS — M791 Myalgia, unspecified site: Secondary | ICD-10-CM | POA: Diagnosis not present

## 2023-11-06 DIAGNOSIS — Z5181 Encounter for therapeutic drug level monitoring: Secondary | ICD-10-CM | POA: Diagnosis not present

## 2023-11-06 DIAGNOSIS — G894 Chronic pain syndrome: Secondary | ICD-10-CM | POA: Diagnosis not present

## 2023-11-06 DIAGNOSIS — R0600 Dyspnea, unspecified: Secondary | ICD-10-CM | POA: Diagnosis not present

## 2023-11-06 DIAGNOSIS — M47816 Spondylosis without myelopathy or radiculopathy, lumbar region: Secondary | ICD-10-CM | POA: Diagnosis not present

## 2023-11-06 DIAGNOSIS — M47814 Spondylosis without myelopathy or radiculopathy, thoracic region: Secondary | ICD-10-CM | POA: Diagnosis not present

## 2023-11-06 DIAGNOSIS — Z79899 Other long term (current) drug therapy: Secondary | ICD-10-CM | POA: Diagnosis not present

## 2023-11-06 DIAGNOSIS — M25559 Pain in unspecified hip: Secondary | ICD-10-CM | POA: Diagnosis not present

## 2023-11-06 DIAGNOSIS — I998 Other disorder of circulatory system: Secondary | ICD-10-CM | POA: Diagnosis not present

## 2023-11-06 DIAGNOSIS — M5416 Radiculopathy, lumbar region: Secondary | ICD-10-CM | POA: Diagnosis not present

## 2023-11-07 DIAGNOSIS — R7989 Other specified abnormal findings of blood chemistry: Secondary | ICD-10-CM | POA: Diagnosis not present

## 2023-11-07 DIAGNOSIS — E890 Postprocedural hypothyroidism: Secondary | ICD-10-CM | POA: Diagnosis not present

## 2023-11-07 DIAGNOSIS — M8000XD Age-related osteoporosis with current pathological fracture, unspecified site, subsequent encounter for fracture with routine healing: Secondary | ICD-10-CM | POA: Diagnosis not present

## 2023-11-27 DIAGNOSIS — F3131 Bipolar disorder, current episode depressed, mild: Secondary | ICD-10-CM | POA: Diagnosis not present

## 2023-12-03 DIAGNOSIS — Z1211 Encounter for screening for malignant neoplasm of colon: Secondary | ICD-10-CM | POA: Diagnosis not present

## 2023-12-03 DIAGNOSIS — K259 Gastric ulcer, unspecified as acute or chronic, without hemorrhage or perforation: Secondary | ICD-10-CM | POA: Diagnosis not present

## 2023-12-03 DIAGNOSIS — I10 Essential (primary) hypertension: Secondary | ICD-10-CM | POA: Diagnosis not present

## 2023-12-03 DIAGNOSIS — Z0000 Encounter for general adult medical examination without abnormal findings: Secondary | ICD-10-CM | POA: Diagnosis not present

## 2023-12-03 DIAGNOSIS — E785 Hyperlipidemia, unspecified: Secondary | ICD-10-CM | POA: Diagnosis not present

## 2023-12-03 DIAGNOSIS — E249 Cushing's syndrome, unspecified: Secondary | ICD-10-CM | POA: Diagnosis not present

## 2023-12-03 DIAGNOSIS — D3500 Benign neoplasm of unspecified adrenal gland: Secondary | ICD-10-CM | POA: Diagnosis not present

## 2023-12-03 DIAGNOSIS — G4733 Obstructive sleep apnea (adult) (pediatric): Secondary | ICD-10-CM | POA: Diagnosis not present

## 2023-12-03 DIAGNOSIS — Z860101 Personal history of adenomatous and serrated colon polyps: Secondary | ICD-10-CM | POA: Diagnosis not present

## 2024-04-13 ENCOUNTER — Ambulatory Visit
Admission: EM | Admit: 2024-04-13 | Discharge: 2024-04-13 | Disposition: A | Discharge status: Home / Self Care | Attending: Emergency Medicine | Admitting: Emergency Medicine

## 2024-04-13 DIAGNOSIS — R300 Dysuria: Secondary | ICD-10-CM | POA: Diagnosis present

## 2024-04-13 DIAGNOSIS — Z8619 Personal history of other infectious and parasitic diseases: Secondary | ICD-10-CM | POA: Insufficient documentation

## 2024-04-13 LAB — POCT URINE DIPSTICK
Glucose, UA: NEGATIVE mg/dL
Ketones, POC UA: NEGATIVE mg/dL
Nitrite, UA: NEGATIVE
Protein Ur, POC: 100 mg/dL — AB
Spec Grav, UA: >=1.03 — AB (ref 1.010–1.025)
Urobilinogen, UA: 0.2 U/dL
pH, UA: 5.5 (ref 5.0–8.0)

## 2024-04-13 MED ORDER — FLUCONAZOLE 150 MG PO TABS: 150.0000 mg | ORAL_TABLET | Freq: Once | ORAL | 0 refills | Status: AC

## 2024-04-13 MED ORDER — CEPHALEXIN 500 MG PO CAPS: 500.0000 mg | ORAL_CAPSULE | Freq: Two times a day (BID) | ORAL | 0 refills | Status: AC

## 2024-04-13 NOTE — ED Provider Notes (Signed)
 Amanda Hendricks    CSN: [id number] Arrival date & time: 04/13/24  1226      History   Chief Complaint Chief Complaint  Patient presents with   Urinary Frequency    Urinary frequency, urinary urgency, pain with urination and discoloration of urine.     HPI Amanda Hendricks is a 58 y.o. female.  Patient presents with 2-day history of dysuria, urinary frequency, urinary urgency, cloudy urine.  No fever, abdominal pain, hematuria, flank pain.  No OTC medication taken.  The history is provided by the patient and medical records.    Past Medical History:  Diagnosis Date   Allergy    Arthritis    Blood transfusion without reported diagnosis    Cancer (HCC)    Compressed spine fracture (HCC)    Depression    Femur fracture (HCC)    Heart murmur    Hip fracture (HCC)    History of stomach ulcers    Hx of gastric bypass    Hypertension    Osteoporosis    Substance abuse (HCC)    Thyroid disease     Patient Active Problem List   Diagnosis Date Noted   Hypothyroidism 11/01/2022   Nausea 11/01/2022   Bipolar 1 disorder (HCC) 07/03/2022   Recent fracture of hip (HCC) 08/28/2021   Left hip pain    History of Roux-en-Y gastric bypass    Ischemia, bowel (HCC) 07/17/2021   Marginal ulcers 06/20/2021   NASH (nonalcoholic steatohepatitis) 06/08/2021   OSA (obstructive sleep apnea) 01/20/2021   Protein-calorie malnutrition (HCC) 01/20/2021   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      Home Medications    Prior to Admission medications    Medication Sig Start Date End Date Taking? Authorizing Provider  acetaminophen  (TYLENOL ) 325 MG tablet Take 650 mg by mouth every 6 (six) hours as needed for mild pain or headache. 08/17/21  Yes [provider]  atorvastatin  (LIPITOR) 10 MG tablet TAKE 1 TABLET(10 MG) BY MOUTH AT BEDTIME 01/29/23  Yes Drubel, Manuelita, PA-C  cephALEXin  (KEFLEX ) 500 MG capsule Take 1 capsule (500 mg total) by mouth 2 (two) times daily for 5 days. 04/13/24 04/18/24 Yes Corlis Burnard DEL, NP  fluconazole  (DIFLUCAN ) 150 MG tablet Take 1 tablet (150 mg total) by mouth once for 1 dose. 04/13/24 04/13/24 Yes Corlis Burnard DEL, NP  losartan  (COZAAR ) 25 MG tablet TAKE 1 TABLET(25 MG) BY MOUTH DAILY 04/06/23  Yes Webb, Padonda B, FNP  metoprolol  tartrate (LOPRESSOR ) 25 MG tablet TAKE 1 TABLET(25 MG) BY MOUTH IN THE MORNING AND AT BEDTIME 01/29/23  Yes Drubel, Manuelita, PA-C  Multiple Vitamins-Minerals (BARIATRIC MULTIVITAMINS/IRON) CAPS Take 1 tablet by mouth at bedtime.   Yes [provider]  oxyCODONE  HCl 15 MG TABA Take by mouth.   Yes [provider]  pantoprazole  (PROTONIX ) 40 MG tablet TAKE 1 TABLET BY MOUTH EVERY DAY 04/10/23  Yes Drubel, Manuelita, PA-C  promethazine  (PHENERGAN ) 12.5 MG tablet Take 12.5 mg by mouth every 8 (eight) hours as needed for nausea  or vomiting. 08/17/21  Yes [provider]  QUEtiapine  (SEROQUEL ) 300 MG tablet Take 1 tablet (300 mg total) by mouth at bedtime. 08/27/22  Yes Drubel, Manuelita, PA-C  Rimegepant Sulfate (NURTEC) 75 MG TBDP Take 1 tablet (75 mg total) by mouth daily as needed (headache). 06/10/23  Yes Athar, Saima, MD  traZODone  (DESYREL ) 100 MG tablet Take 1 tablet (100 mg total) by mouth at bedtime. 12/14/21  Yes Bertrum Charlie CROME, MD  levothyroxine  (SYNTHROID ) 100 MCG tablet Take 100 mcg by mouth daily. 02/21/21 10/02/22  [provider]  ondansetron  (ZOFRAN -ODT) 4 MG disintegrating tablet Take 1 tablet (4 mg total) by mouth every 8 (eight) hours as needed for nausea  or vomiting. 06/10/23   Buck Saucer, MD    Family History Family History  Problem Relation Age of Onset   Arthritis Mother    Hypertension Mother    Heart disease Mother    COPD Mother    Migraines Mother    Breast cancer Paternal Aunt     Social History Social History   Tobacco Use   Smoking status: Some Days    Current packs/day: 0.25    Types: Cigarettes   Smokeless tobacco: Never  Vaping Use   Vaping status: Never Used  Substance Use Topics   Alcohol use: Not Currently   Drug use: Not Currently     Allergies   Lisinopril and Nsaids   Review of Systems Review of Systems  Constitutional:  Negative for chills and fever.  Gastrointestinal:  Negative for abdominal pain, diarrhea, nausea and vomiting.  Genitourinary:  Positive for dysuria, frequency and urgency. Negative for flank pain and hematuria.     Physical Exam Triage Vital Signs ED Triage Vitals  Encounter Vitals Group     BP 04/13/24 1247 120/83     Girls Systolic BP Percentile --      Girls Diastolic BP Percentile --      Boys Systolic BP Percentile --      Boys Diastolic BP Percentile --      Pulse Rate 04/13/24 1247 (!) 112     Resp 04/13/24 1247 17     Temp 04/13/24 1247 97.7 F (36.5 C)     Temp Source 04/13/24 1247 Temporal     SpO2 04/13/24 1247 98 %     Weight 04/13/24 1245 155 lb (70.3 kg)     Height 04/13/24 1245 5' 6 (1.676 m)     Head Circumference --      Peak Flow --      Pain Score 04/13/24 1245 2     Pain Loc --      Pain Education --      Exclude from Growth Chart --    No data found.  Updated Vital Signs BP 120/83 (BP Location: Left Arm)   Pulse (!) 112   Temp 97.7 F (36.5 C) (Temporal)   Resp 17   Ht 5' 6 (1.676 m)   Wt 155 lb (70.3 kg)   SpO2 98%   BMI 25.02 kg/m   Visual Acuity Right Eye Distance:   Left Eye Distance:   Bilateral Distance:    Right Eye Near:   Left Eye Near:    Bilateral Near:     Physical Exam Constitutional:      General: She  is not in acute distress. HENT:     Mouth/Throat:     Mouth: Mucous membranes are moist.  Cardiovascular:     Rate and Rhythm: Normal  rate and regular rhythm.  Pulmonary:     Effort: Pulmonary effort is normal. No respiratory distress.  Abdominal:     General: Bowel sounds are normal.     Palpations: Abdomen is soft.     Tenderness: There is no abdominal tenderness. There is no right CVA tenderness, left CVA tenderness, guarding or rebound.  Neurological:     Mental Status: She is alert.      UC Treatments / Results  Labs (all labs ordered are listed, but only abnormal results are displayed) Labs Reviewed  POCT URINE DIPSTICK - Abnormal; Notable for the following components:      Result Value   Bilirubin, UA small (*)    Spec Grav, UA >=1.030 (*)    Blood, UA moderate (*)    Protein Ur, POC =100 (*)    Leukocytes, UA Small (1+) (*)    All other components within normal limits  URINE CULTURE    EKG   Radiology No results found.  Procedures Procedures (including critical care time)  Medications Ordered in UC Medications - No data to display  Initial Impression / Assessment and Plan / UC Course  I have reviewed the triage vital signs and the nursing notes.  Pertinent labs & imaging results that were available during my care of the patient were reviewed by me and considered in my medical decision making (see chart for details).    Dysuria, history of vaginal yeast infection when taking an antibiotic.  Treating with Keflex .  Also prescribed 1 dose of Diflucan  per patient request.  Urine culture pending. Discussed with patient that we will call her if the urine culture shows the need to change or discontinue the antibiotic. Instructed her to follow-up with her PCP. Patient agrees to plan of care.     Final Clinical Impressions(s) / UC Diagnoses   Final diagnoses:  Dysuria  History of candidiasis of vagina     Discharge Instructions      Take the antibiotic as  directed.  The urine culture is pending.  We will call you if it shows the need to change or discontinue your antibiotic.    Take the Diflucan  as directed.    Follow up with your primary care provider.        ED Prescriptions     Medication Sig Dispense Auth. Provider   cephALEXin  (KEFLEX ) 500 MG capsule Take 1 capsule (500 mg total) by mouth 2 (two) times daily for 5 days. 10 capsule Corlis Burnard DEL, NP   fluconazole  (DIFLUCAN ) 150 MG tablet Take 1 tablet (150 mg total) by mouth once for 1 dose. 1 tablet Corlis Burnard DEL, NP      PDMP not reviewed this encounter.   Corlis Burnard DEL, NP 04/13/24 [id number]

## 2024-04-13 NOTE — Discharge Instructions (Addendum)
 Take the antibiotic as directed.  The urine culture is pending.  We will call you if it shows the need to change or discontinue your antibiotic.    Take the Diflucan  as directed.    Follow up with your primary care provider.

## 2024-04-13 NOTE — ED Triage Notes (Signed)
 Pt states that she has some urinary frequency, urinary urgency, pain with urination and discoloration of her urine. X2 days

## 2024-04-16 ENCOUNTER — Ambulatory Visit (HOSPITAL_COMMUNITY): Payer: Self-pay

## 2024-04-16 ENCOUNTER — Telehealth: Payer: Self-pay

## 2024-04-16 ENCOUNTER — Other Ambulatory Visit (HOSPITAL_COMMUNITY): Payer: Self-pay

## 2024-04-16 LAB — URINE CULTURE: Culture: 80000 — AB

## 2024-04-16 NOTE — Telephone Encounter (Signed)
 Pharmacy Patient Advocate Encounter   Received notification from Fax that prior authorization for Nurtec is required/requested.   Insurance verification completed.   The patient is insured through [facility] .   Per test claim: PA required; PA submitted to above mentioned insurance via Latent Key/confirmation #/EOC [id number] Status is pending

## 2024-04-17 ENCOUNTER — Other Ambulatory Visit (HOSPITAL_COMMUNITY): Payer: Self-pay

## 2024-04-17 NOTE — Telephone Encounter (Signed)
 Pharmacy Patient Advocate Encounter  Received notification from [employer] that Prior Authorization for Nurtec has been APPROVED from 04/16/2024 to 08/12/2024. Ran test claim, Copay is $0. This test claim was processed through [facility]- copay amounts may vary at other pharmacies due to pharmacy/plan contracts, or as the patient moves through the different stages of their insurance plan.   PA #/Case ID/Reference #: [id number]

## 2024-06-01 ENCOUNTER — Emergency Department

## 2024-06-01 ENCOUNTER — Emergency Department
Admission: EM | Admit: 2024-06-01 | Discharge: 2024-06-01 | Disposition: A | Discharge status: Home / Self Care | Attending: Emergency Medicine | Admitting: Emergency Medicine

## 2024-06-01 ENCOUNTER — Other Ambulatory Visit: Payer: Self-pay

## 2024-06-01 DIAGNOSIS — M25452 Effusion, left hip: Secondary | ICD-10-CM | POA: Diagnosis not present

## 2024-06-01 DIAGNOSIS — E876 Hypokalemia: Secondary | ICD-10-CM | POA: Diagnosis not present

## 2024-06-01 DIAGNOSIS — R112 Nausea with vomiting, unspecified: Secondary | ICD-10-CM | POA: Insufficient documentation

## 2024-06-01 DIAGNOSIS — R1010 Upper abdominal pain, unspecified: Secondary | ICD-10-CM | POA: Diagnosis present

## 2024-06-01 DIAGNOSIS — R1013 Epigastric pain: Secondary | ICD-10-CM | POA: Insufficient documentation

## 2024-06-01 DIAGNOSIS — Z96642 Presence of left artificial hip joint: Secondary | ICD-10-CM | POA: Insufficient documentation

## 2024-06-01 DIAGNOSIS — R519 Headache, unspecified: Secondary | ICD-10-CM | POA: Insufficient documentation

## 2024-06-01 LAB — CBC
HCT: 40.8 % (ref 36.0–46.0)
Hemoglobin: 13.1 g/dL (ref 12.0–15.0)
MCH: 29.5 pg (ref 26.0–34.0)
MCHC: 32.1 g/dL (ref 30.0–36.0)
MCV: 91.9 fL (ref 80.0–100.0)
Platelets: 307 K/uL (ref 150–400)
RBC: 4.44 MIL/uL (ref 3.87–5.11)
RDW: 13.5 % (ref 11.5–15.5)
WBC: 7.4 K/uL (ref 4.0–10.5)
nRBC: 0 % (ref 0.0–0.2)

## 2024-06-01 LAB — URINALYSIS, ROUTINE W REFLEX MICROSCOPIC
Bilirubin Urine: NEGATIVE
Glucose, UA: NEGATIVE mg/dL
Hgb urine dipstick: NEGATIVE
Ketones, ur: NEGATIVE mg/dL
Leukocytes,Ua: NEGATIVE
Nitrite: NEGATIVE
Protein, ur: NEGATIVE mg/dL
Specific Gravity, Urine: 1.021 (ref 1.005–1.030)
pH: 5 (ref 5.0–8.0)

## 2024-06-01 LAB — COMPREHENSIVE METABOLIC PANEL WITH GFR
ALT: 13 U/L (ref 0–44)
AST: 25 U/L (ref 15–41)
Albumin: 3.6 g/dL (ref 3.5–5.0)
Alkaline Phosphatase: 139 U/L — ABNORMAL HIGH (ref 38–126)
Anion gap: 14 (ref 5–15)
BUN: 13 mg/dL (ref 6–20)
CO2: 28 mmol/L (ref 22–32)
Calcium: 9 mg/dL (ref 8.9–10.3)
Chloride: 98 mmol/L (ref 98–111)
Creatinine, Ser: 0.76 mg/dL (ref 0.44–1.00)
GFR, Estimated: >60 mL/min (ref >60)
Glucose, Bld: 88 mg/dL (ref 70–99)
Potassium: 3.3 mmol/L — ABNORMAL LOW (ref 3.5–5.1)
Sodium: 140 mmol/L (ref 135–145)
Total Bilirubin: <0.2 mg/dL (ref 0.0–1.2)
Total Protein: 7 g/dL (ref 6.5–8.1)

## 2024-06-01 LAB — MAGNESIUM: Magnesium: 2 mg/dL (ref 1.7–2.4)

## 2024-06-01 LAB — TROPONIN I (HIGH SENSITIVITY): Troponin I (High Sensitivity): 5 ng/L (ref <18)

## 2024-06-01 LAB — LIPASE, BLOOD: Lipase: 30 U/L (ref 11–51)

## 2024-06-01 MED ORDER — SODIUM CHLORIDE 0.9 % IV BOLUS
1000.0000 mL | Freq: Once | INTRAVENOUS | Status: AC
  Administered 2024-06-01: 1000 mL via INTRAVENOUS

## 2024-06-01 MED ORDER — PANTOPRAZOLE SODIUM 40 MG IV SOLR
40.0000 mg | Freq: Once | INTRAVENOUS | Status: AC
  Administered 2024-06-01: 40 mg via INTRAVENOUS
  Filled 2024-06-01: qty 10

## 2024-06-01 MED ORDER — MORPHINE SULFATE (PF) 4 MG/ML IV SOLN
4.0000 mg | Freq: Once | INTRAVENOUS | Status: AC
  Administered 2024-06-01: 4 mg via INTRAVENOUS
  Filled 2024-06-01: qty 1

## 2024-06-01 MED ORDER — POTASSIUM CHLORIDE 20 MEQ PO PACK
40.0000 meq | PACK | Freq: Once | ORAL | Status: AC
  Administered 2024-06-01: 40 meq via ORAL
  Filled 2024-06-01: qty 2

## 2024-06-01 MED ORDER — IOHEXOL 300 MG/ML  SOLN
100.0000 mL | Freq: Once | INTRAMUSCULAR | Status: AC | PRN
  Administered 2024-06-01: 100 mL via INTRAVENOUS

## 2024-06-01 MED ORDER — ONDANSETRON 4 MG PO TBDP: 4.0000 mg | ORAL_TABLET | Freq: Three times a day (TID) | ORAL | 0 refills | Status: AC | PRN

## 2024-06-01 MED ORDER — ONDANSETRON HCL 4 MG/2ML IJ SOLN
4.0000 mg | Freq: Once | INTRAMUSCULAR | Status: AC
Start: 2024-06-01 — End: 2024-06-01
  Administered 2024-06-01: 4 mg via INTRAVENOUS
  Filled 2024-06-01: qty 2

## 2024-06-01 NOTE — ED Triage Notes (Signed)
 Pt comes in via pov with complaints of abdominal pain and vomiting the past 3-4 day. Pt states that she has had to episodes of vomiting today, and the pt noticed traces of blood in her emesis. Pt has a history of bleeding ulcer's and wants to be safe. Pt complains of abdominal pain 8/10 at this time.

## 2024-06-01 NOTE — Discharge Instructions (Signed)
 You were seen in the emergency department for abdominal pain with nausea and vomiting.  You are given IV fluids and nausea medication and pain medication.  You have a CT scan of your abdomen and pelvis that showed some inflammation to your stomach.  You also had fluid in your left hip.  You were given IV fluids.  You were able to keep down food after your nausea medication.  Your potassium was mildly low.  Stay hydrated and drink plenty of fluids.  Continue to take your Protonix  40 mg twice a day.  Call to schedule a follow-up appointment with gastroenterology, you likely need a repeat scope.  Return to the emergency department if your pain returns.  You had a loculated fluid collection in your left hip, follow up with your orthopedic doctor, call them tomorrow and discuss whether you need further workup for a chronic infection in your left hip.  Return to the emergency department for any worsening pain to your left hip, redness or swelling.  Your potassium was mildly low when checked today.  Make sure to follow up with a primary doctor to follow up your labs.  Make sure to eat food high in potassium and magnesium  - examples - potatoes, spinach, bananas, beans, avocadoes, oranges, nuts.

## 2024-06-01 NOTE — ED Notes (Signed)
Orthostatic BP

## 2024-06-01 NOTE — ED Provider Notes (Signed)
 [facility] Provider Note    Event Date/Time   First MD Initiated Contact with Patient 06/01/24 1701     (approximate)   History   Abdominal Pain   HPI  Amanda Hendricks is a 58 y.o. female past medical history significant for an extensive abdominal surgeries including cholecystectomy, Roux-en-Y gastric bypass with reversal, ventral hernia repair, history of perforated gastric ulcer, history of bipolar disorder, OSA, hyperlipidemia, left hip repair and revision, who presents to the emergency department with abdominal pain.  States that she has been having upper abdominal pain with nausea and vomiting over the past 3 to 4 days.  Felt like she had an episode of red streaking in her emesis and she was concerned that it was blood from a gastric ulcer.  Denies any significant melena or tarry stools.  Does believe that her stool is darker than normal.  Not on any anticoagulation.  Denies any alcohol use, marijuana use or NSAID use.  No history of bowel obstruction.  Also states that when she goes to stand up she feels very lightheaded and dizzy and this has been happening over the past couple of days.  States that she had a headache which was new.  Denies any sudden onset of headache.  Last endoscopy was multiple years ago.     Physical Exam   Triage Vital Signs: ED Triage Vitals  Encounter Vitals Group     BP 06/01/24 1520 (!) 133/100     Girls Systolic BP Percentile --      Girls Diastolic BP Percentile --      Boys Systolic BP Percentile --      Boys Diastolic BP Percentile --      Pulse Rate 06/01/24 1520 96     Resp 06/01/24 1520 18     Temp 06/01/24 1520 98.9 F (37.2 C)     Temp src --      SpO2 06/01/24 1520 96 %     Weight 06/01/24 1522 148 lb (67.1 kg)     Height 06/01/24 1522 5' 6 (1.676 m)     Head Circumference --      Peak Flow --      Pain Score 06/01/24 1522 8     Pain Loc --      Pain Education --      Exclude from Growth Chart --      Most recent vital signs: Vitals:   06/01/24 1910 06/01/24 2304  BP: (!) 144/85 (!) 144/86  Pulse: 80 81  Resp: 18 18  Temp: 98.8 F (37.1 C) 98.6 F (37 C)  SpO2: 100% 100%    Physical Exam Exam conducted with a chaperone present.  Constitutional:      Appearance: She is well-developed.  HENT:     Head: Atraumatic.  Eyes:     Extraocular Movements: Extraocular movements intact.     Conjunctiva/sclera: Conjunctivae normal.     Pupils: Pupils are equal, round, and reactive to light.  Cardiovascular:     Rate and Rhythm: Regular rhythm.  Pulmonary:     Effort: No respiratory distress.  Abdominal:     General: There is no distension.     Tenderness: There is abdominal tenderness in the epigastric area.  Genitourinary:    Comments: DRE with no gross blood or melena Musculoskeletal:        General: Normal range of motion.     Cervical back: Normal range of motion.  Comments: Left hip with no significant tenderness to palpation.  Full range of motion of the left hip.  No erythema warmth or induration.  No crepitance.  Skin:    General: Skin is warm.     Capillary Refill: Capillary refill takes less than 2 seconds.  Neurological:     Mental Status: She is alert. Mental status is at baseline.     GCS: GCS eye subscore is 4. GCS verbal subscore is 5. GCS motor subscore is 6.     Cranial Nerves: Cranial nerves 2-12 are intact.     Sensory: Sensation is intact.     Motor: Motor function is intact.     Comments: Patient has abnormal gait but states that she always walks with a limp since her hip replacement.  No significant gait instability.  No nystagmus     IMPRESSION / MDM / ASSESSMENT AND PLAN / ED COURSE  I reviewed the triage vital signs and the nursing notes.  Differential diagnosis including gastritis/PUD, pancreatitis, small bowel obstruction, ACS, intracranial hemorrhage, dehydration, viral illness including COVID/influenza   EKG  I, Clotilda Punter, the  attending physician, personally viewed and interpreted this ECG.   Rate: Normal  Rhythm: Normal sinus  Axis: Normal  Intervals: Normal  ST&T Change: None  No tachycardic or bradycardic dysrhythmias while on cardiac telemetry.  RADIOLOGY CT scan of the head was negative for obvious acute ischemia or intracranial hemorrhage.  Read as patchy white matter hypodensities that were nonspecific and could be seen with chronic small vessel ischemic changes.  CT scan abdomen and pelvis with no obvious findings of small bowel obstruction.  Postsurgical changes.  Chronic hepatic dilation.  Left hip loculated fluid. (all labs ordered are listed, but only abnormal results are displayed) Labs interpreted as -    Labs Reviewed  COMPREHENSIVE METABOLIC PANEL WITH GFR - Abnormal; Notable for the following components:      Result Value   Potassium 3.3 (*)    Alkaline Phosphatase 139 (*)    All other components within normal limits  URINALYSIS, ROUTINE W REFLEX MICROSCOPIC - Abnormal; Notable for the following components:   Color, Urine YELLOW (*)    APPearance CLEAR (*)    All other components within normal limits  LIPASE, BLOOD  CBC  MAGNESIUM   TROPONIN I (HIGH SENSITIVITY)     MDM    Patient given IV fluids, IV Protonix  and pain medication and antiemetics  Lab work with no significant leukocytosis.  Hemoglobin is 13.1 and actually increased from prior at 12.3.  Normal BUN of 13.  Creatinine at baseline.  Troponin is negative, have low suspicion for ACS no signs of urinary tract infection.  Potassium mildly low and given oral replacement.  CT scan of the abdomen and pelvis read as postsurgical changes with fluid-filled mildly dilated small bowel loops in the left lower quadrant near surgical Sujan but no convincing obstruction.  Mild wall thickening of the antral pyloric region.  Chronic moderate to severe intra and extrahepatic biliary dilation.  Left hip replacement with artifact and concern  for loculated fluid collection to the left hip measuring approximately 5 cm.  Patient able to tolerate p.o.  States that her nausea, vomiting and pain has significantly improved.  Passing flatus.  Low suspicion for small bowel obstruction.  Discussed the loculated fluid collection in the left hip given that she has had recurrent surgeries to the left hip and replacement with Dr. DELENA Neri who is on-call for orthopedics.  No  concern at this time for a septic joint and is having full range of motion.  Recommended her following up closely with her outpatient orthopedic surgeon.  On reevaluation patient states she is feeling much better does not have a work ongoing headache and have a low suspicion for cerebral venous thrombosis.  No nystagmus or focal findings on exam that would make me think that this is consistent with a CVA or dissection.  Lightheadedness is more consistent with dehydration.  No findings of GI bleed on my exam.  Tolerating p.o. and states she is feeling much better.  Offered admission and discussed her ongoing pain, patient states that she was feeling better and she wants to try to go home and that she would return if her pain returns.  Patient is already on Protonix  40 mg twice daily.  Given information to reestablish care with gastroenterology.  Discussed return precautions.  No questions at time of discharge.   PROCEDURES:  Critical Care performed: No  Procedures  Patient's presentation is most consistent with acute presentation with potential threat to life or bodily function.   MEDICATIONS ORDERED IN ED: Medications  sodium chloride  0.9 % bolus 1,000 mL (0 mLs Intravenous Stopped 06/01/24 2223)  ondansetron  (ZOFRAN ) injection 4 mg (4 mg Intravenous Given 06/01/24 1814)  pantoprazole  (PROTONIX ) injection 40 mg (40 mg Intravenous Given 06/01/24 1814)  morphine  (PF) 4 MG/ML injection 4 mg (4 mg Intravenous Given 06/01/24 1814)  iohexol  (OMNIPAQUE ) 300 MG/ML solution 100 mL  (100 mLs Intravenous Contrast Given 06/01/24 1911)  morphine  (PF) 4 MG/ML injection 4 mg (4 mg Intravenous Given 06/01/24 2038)  potassium chloride  (KLOR-CON ) packet 40 mEq (40 mEq Oral Given 06/01/24 2038)    FINAL CLINICAL IMPRESSION(S) / ED DIAGNOSES   Final diagnoses:  Epigastric pain  Nausea and vomiting, unspecified vomiting type  Effusion of left hip  Hypokalemia     Rx / DC Orders   ED Discharge Orders          Ordered    ondansetron  (ZOFRAN -ODT) 4 MG disintegrating tablet  Every 8 hours PRN        06/01/24 2237             Note:  This document was prepared using Dragon voice recognition software and may include unintentional dictation errors.   Suzanne Kirsch, MD 06/02/24 0030

## 2024-07-29 ENCOUNTER — Telehealth: Payer: Self-pay

## 2024-07-29 ENCOUNTER — Other Ambulatory Visit (HOSPITAL_COMMUNITY): Payer: Self-pay

## 2024-07-29 NOTE — Telephone Encounter (Signed)
 Pharmacy Patient Advocate Encounter   Received notification from CoverMyMeds that prior authorization for Nur[employer] is due for renewal.   Insurance verification completed.   The patient is insured through [facility]  Action: Patient hasn't been seen in your office in over a year. Plan requires updated chart notes for PA renewal.

## 2024-07-31 ENCOUNTER — Encounter: Admission: RE | Disposition: A | Payer: Self-pay | Attending: Gastroenterology

## 2024-07-31 ENCOUNTER — Ambulatory Visit
Admission: RE | Admit: 2024-07-31 | Discharge: 2024-07-31 | Disposition: A | Discharge status: Home / Self Care | Attending: Gastroenterology | Admitting: Gastroenterology

## 2024-07-31 ENCOUNTER — Ambulatory Visit

## 2024-07-31 DIAGNOSIS — M810 Age-related osteoporosis without current pathological fracture: Secondary | ICD-10-CM | POA: Diagnosis not present

## 2024-07-31 DIAGNOSIS — Z1211 Encounter for screening for malignant neoplasm of colon: Secondary | ICD-10-CM | POA: Insufficient documentation

## 2024-07-31 DIAGNOSIS — K2970 Gastritis, unspecified, without bleeding: Secondary | ICD-10-CM | POA: Insufficient documentation

## 2024-07-31 DIAGNOSIS — D123 Benign neoplasm of transverse colon: Secondary | ICD-10-CM | POA: Diagnosis not present

## 2024-07-31 DIAGNOSIS — K3189 Other diseases of stomach and duodenum: Secondary | ICD-10-CM | POA: Insufficient documentation

## 2024-07-31 DIAGNOSIS — Z9884 Bariatric surgery status: Secondary | ICD-10-CM | POA: Diagnosis not present

## 2024-07-31 DIAGNOSIS — I10 Essential (primary) hypertension: Secondary | ICD-10-CM | POA: Diagnosis not present

## 2024-07-31 DIAGNOSIS — D124 Benign neoplasm of descending colon: Secondary | ICD-10-CM | POA: Diagnosis not present

## 2024-07-31 DIAGNOSIS — K279 Peptic ulcer, site unspecified, unspecified as acute or chronic, without hemorrhage or perforation: Secondary | ICD-10-CM | POA: Diagnosis not present

## 2024-07-31 DIAGNOSIS — K640 First degree hemorrhoids: Secondary | ICD-10-CM | POA: Insufficient documentation

## 2024-07-31 DIAGNOSIS — K219 Gastro-esophageal reflux disease without esophagitis: Secondary | ICD-10-CM | POA: Insufficient documentation

## 2024-07-31 DIAGNOSIS — D122 Benign neoplasm of ascending colon: Secondary | ICD-10-CM | POA: Insufficient documentation

## 2024-07-31 DIAGNOSIS — E039 Hypothyroidism, unspecified: Secondary | ICD-10-CM | POA: Insufficient documentation

## 2024-07-31 HISTORY — PX: POLYPECTOMY: SHX149

## 2024-07-31 HISTORY — PX: BIOPSY OF SKIN SUBCUTANEOUS TISSUE AND/OR MUCOUS MEMBRANE: SHX6741

## 2024-07-31 HISTORY — PX: COLONOSCOPY: SHX5424

## 2024-07-31 HISTORY — PX: ESOPHAGOGASTRODUODENOSCOPY: SHX5428

## 2024-07-31 SURGERY — COLONOSCOPY
Anesthesia: General

## 2024-07-31 MED ORDER — PROPOFOL 10 MG/ML IV BOLUS
INTRAVENOUS | Status: AC
  Filled 2024-07-31: qty 40

## 2024-07-31 MED ORDER — SODIUM CHLORIDE 0.9 % IV SOLN
INTRAVENOUS | Status: DC
  Administered 2024-07-31: 500 mL via INTRAVENOUS

## 2024-07-31 MED ORDER — PROPOFOL 10 MG/ML IV BOLUS
INTRAVENOUS | Status: AC
  Filled 2024-07-31: qty 20

## 2024-07-31 MED ORDER — LIDOCAINE HCL (CARDIAC) PF 100 MG/5ML IV SOSY
PREFILLED_SYRINGE | INTRAVENOUS | Status: DC | PRN
  Administered 2024-07-31: 60 mg via INTRAVENOUS

## 2024-07-31 MED ORDER — PROPOFOL 10 MG/ML IV BOLUS
INTRAVENOUS | Status: DC | PRN
  Administered 2024-07-31: 20 mg via INTRAVENOUS
  Administered 2024-07-31 (×2): 40 mg via INTRAVENOUS
  Administered 2024-07-31: 20 mg via INTRAVENOUS
  Administered 2024-07-31 (×3): 30 mg via INTRAVENOUS
  Administered 2024-07-31: 50 mg via INTRAVENOUS
  Administered 2024-07-31: 20 mg via INTRAVENOUS
  Administered 2024-07-31: 30 mg via INTRAVENOUS
  Administered 2024-07-31: 50 mg via INTRAVENOUS
  Administered 2024-07-31: 30 mg via INTRAVENOUS
  Administered 2024-07-31: 40 mg via INTRAVENOUS
  Administered 2024-07-31: 20 mg via INTRAVENOUS
  Administered 2024-07-31: 30 mg via INTRAVENOUS

## 2024-07-31 MED ORDER — LIDOCAINE HCL (PF) 2 % IJ SOLN
INTRAMUSCULAR | Status: AC
  Filled 2024-07-31: qty 5

## 2024-07-31 NOTE — Anesthesia Postprocedure Evaluation (Signed)
"   Anesthesia Post Note  Patient: Amanda Hendricks  Procedure(s) Performed: COLONOSCOPY EGD (ESOPHAGOGASTRODUODENOSCOPY)  Patient location during evaluation: PACU Anesthesia Type: General Level of consciousness: awake and alert Pain management: pain level controlled Vital Signs Assessment: post-procedure vital signs reviewed and stable Respiratory status: spontaneous breathing, nonlabored ventilation and respiratory function stable Cardiovascular status: blood pressure returned to baseline and stable Postop Assessment: no apparent nausea or vomiting Anesthetic complications: no   No notable events documented.   Last Vitals:  Vitals:   07/31/24 0919 07/31/24 0930  BP: (!) 110/99 124/84  Pulse: 84 86  Resp: 19 19  Temp: (!) 35.6 C   SpO2: 100% 100%    Last Pain:  Vitals:   07/31/24 0930  TempSrc:   PainSc: 0-No pain                 Fairy POUR Marquize Seib      "

## 2024-07-31 NOTE — Op Note (Signed)
 [facility] Gastroenterology Patient Name: Amanda Hendricks Procedure Date: 07/31/2024 7:42 AM MRN: [id number] Account #: [id number] Date of Birth: 1966-04-04 Admit Type: Outpatient Age: 58 Room: [facility] ENDO ROOM 3 Gender: Female Note Status: Finalized Instrument Name: Endoscope [id number] Procedure:             Upper GI endoscopy Indications:           Gastro-esophageal reflux disease, Peptic ulcer Providers:             Ole Schick MD, MD Referring MD:          Charlie CROME. Bertrum, MD (Referring MD) Medicines:             Monitored Anesthesia Care Complications:         No immediate complications. Estimated blood loss:                         Minimal. Procedure:             Pre-Anesthesia Assessment:                        - Prior to the procedure, a History and Physical was                         performed, and patient medications and allergies were                         reviewed. The patient is competent. The risks and                         benefits of the procedure and the sedation options and                         risks were discussed with the patient. All questions                         were answered and informed consent was obtained.                         Patient identification and proposed procedure were                         verified by the physician, the nurse, the                         anesthesiologist, the anesthetist and the technician                         in the endoscopy suite. Mental Status Examination:                         alert and oriented. Airway Examination: normal                         oropharyngeal airway and neck mobility. Respiratory                         Examination: clear to auscultation. CV Examination:  normal. Prophylactic Antibiotics: The patient does not                         require prophylactic antibiotics. Prior                         Anticoagulants: The patient has taken no  anticoagulant                         or antiplatelet agents. ASA Grade Assessment: III - A                         patient with severe systemic disease. After reviewing                         the risks and benefits, the patient was deemed in                         satisfactory condition to undergo the procedure. The                         anesthesia plan was to use monitored anesthesia care                         (MAC). Immediately prior to administration of                         medications, the patient was re-assessed for adequacy                         to receive sedatives. The heart rate, respiratory                         rate, oxygen saturations, blood pressure, adequacy of                         pulmonary ventilation, and response to care were                         monitored throughout the procedure. The physical                         status of the patient was re-assessed after the                         procedure.                        After obtaining informed consent, the endoscope was                         passed under direct vision. Throughout the procedure,                         the patient's blood pressure, pulse, and oxygen                         saturations were monitored continuously. The Endoscope  was introduced through the mouth, and advanced to the                         second part of duodenum. The upper GI endoscopy was                         accomplished without difficulty. The patient tolerated                         the procedure well. Findings:      The examined esophagus was normal.      Patchy minimal inflammation characterized by erythema was found in the       gastric body. Biopsies were taken with a cold forceps for Helicobacter       pylori testing. Estimated blood loss was minimal.      The examined duodenum was normal. Impression:            - Normal esophagus.                        - Gastritis. Biopsied.                         - Normal examined duodenum. Recommendation:        - Discharge patient to home.                        - Resume previous diet.                        - Continue present medications.                        - Await pathology results.                        - Return to referring physician as previously                         scheduled. Procedure Code(s):     --- Professional ---                        [phone], Esophagogastroduodenoscopy, flexible,                         transoral; with biopsy, single or multiple Diagnosis Code(s):     --- Professional ---                        K29.70, Gastritis, unspecified, without bleeding                        K21.9, Gastro-esophageal reflux disease without                         esophagitis                        K27.9, Peptic ulcer, site unspecified, unspecified as                         acute or chronic, without hemorrhage or perforation CPT  copyright 2022 American Medical Association. All rights reserved. The codes documented in this report are preliminary and upon coder review may  be revised to meet current compliance requirements. Ole Schick MD, MD 07/31/2024 9:19:33 AM Number of Addenda: 0 Note Initiated On: 07/31/2024 7:42 AM Estimated Blood Loss:  Estimated blood loss was minimal.      [facility]

## 2024-07-31 NOTE — Interval H&P Note (Signed)
 History and Physical Interval Note:  07/31/2024 8:23 AM  Amanda Hendricks  has presented today for surgery, with the diagnosis of GERD,PUD,HX OF ADENOMATOUS POLYP OF COLON,BOWEL HABIT CHANGES.  The various methods of treatment have been discussed with the patient and family. After consideration of risks, benefits and other options for treatment, the patient has consented to  Procedures: COLONOSCOPY (N/A) EGD (ESOPHAGOGASTRODUODENOSCOPY) (N/A) as a surgical intervention.  The patient's history has been reviewed, patient examined, no change in status, stable for surgery.  I have reviewed the patient's chart and labs.  Questions were answered to the patient's satisfaction.     [name]  Ok to proceed with EGD/Colonoscopy

## 2024-07-31 NOTE — Op Note (Addendum)
 [facility] Gastroenterology Patient Name: Amanda Hendricks Procedure Date: 07/31/2024 7:42 AM MRN: [id number] Account #: [id number] Date of Birth: 11-18-1965 Admit Type: Outpatient Age: 58 Room: [facility] ENDO ROOM 3 Gender: Female Note Status: Supervisor Override Instrument Name: Colon Scope [id number] Procedure:             Colonoscopy Indications:           Surveillance: Personal history of adenomatous polyps                         on last colonoscopy 5 years ago, Incidental change in                         bowel habits noted Providers:             Ole Schick MD, MD Referring MD:          Charlie CROME. Bertrum, MD (Referring MD) Medicines:             Monitored Anesthesia Care Complications:         No immediate complications. Estimated blood loss:                         Minimal. Procedure:             Pre-Anesthesia Assessment:                        - Prior to the procedure, a History and Physical was                         performed, and patient medications and allergies were                         reviewed. The patient is competent. The risks and                         benefits of the procedure and the sedation options and                         risks were discussed with the patient. All questions                         were answered and informed consent was obtained.                         Patient identification and proposed procedure were                         verified by the physician, the nurse, the                         anesthesiologist, the anesthetist and the technician                         in the endoscopy suite. Mental Status Examination:                         alert and oriented. Airway Examination: normal  oropharyngeal airway and neck mobility. Respiratory                         Examination: clear to auscultation. CV Examination:                         normal. Prophylactic Antibiotics: The patient does not                          require prophylactic antibiotics. Prior                         Anticoagulants: The patient has taken no anticoagulant                         or antiplatelet agents. ASA Grade Assessment: III - A                         patient with severe systemic disease. After reviewing                         the risks and benefits, the patient was deemed in                         satisfactory condition to undergo the procedure. The                         anesthesia plan was to use monitored anesthesia care                         (MAC). Immediately prior to administration of                         medications, the patient was re-assessed for adequacy                         to receive sedatives. The heart rate, respiratory                         rate, oxygen saturations, blood pressure, adequacy of                         pulmonary ventilation, and response to care were                         monitored throughout the procedure. The physical                         status of the patient was re-assessed after the                         procedure.                        After obtaining informed consent, the colonoscope was                         passed under direct vision. Throughout the procedure,  the patient's blood pressure, pulse, and oxygen                         saturations were monitored continuously. The                         Colonoscope was introduced through the anus and                         advanced to the the cecum, identified by appendiceal                         orifice and ileocecal valve. The colonoscopy was                         technically difficult and complex due to a redundant                         colon. Successful completion of the procedure was                         aided by applying abdominal pressure. The patient                         tolerated the procedure well. The quality of the bowel                          preparation was good. The ileocecal valve, appendiceal                         orifice, and rectum were photographed. Findings:      The perianal and digital rectal examinations were normal.      A 2 mm polyp was found in the ascending colon. The polyp was sessile.       The polyp was removed with a jumbo cold forceps. Resection and retrieval       were complete. Estimated blood loss was minimal.      Three sessile polyps were found in the hepatic flexure. The polyps were       3 to 4 mm in size. These polyps were removed with a cold snare.       Resection and retrieval were complete. Estimated blood loss was minimal.      Two sessile polyps were found in the transverse colon. The polyps were 2       to 3 mm in size. These polyps were removed with a cold snare. Resection       and retrieval were complete. Estimated blood loss was minimal.      A 2 mm polyp was found in the descending colon. The polyp was sessile.       The polyp was removed with a cold snare. Resection and retrieval were       complete. Estimated blood loss was minimal.      Internal hemorrhoids were found during retroflexion. The hemorrhoids       were Grade I (internal hemorrhoids that do not prolapse).      The exam was otherwise without abnormality on direct and retroflexion       views. Impression:            -  One 2 mm polyp in the ascending colon, removed with                         a jumbo cold forceps. Resected and retrieved.                        - Three 3 to 4 mm polyps at the hepatic flexure,                         removed with a cold snare. Resected and retrieved.                        - Two 2 to 3 mm polyps in the transverse colon,                         removed with a cold snare. Resected and retrieved.                        - One 2 mm polyp in the descending colon, removed with                         a cold snare. Resected and retrieved.                        - Internal hemorrhoids.                         - The examination was otherwise normal on direct and                         retroflexion views. Recommendation:        - Discharge patient to home.                        - Resume previous diet.                        - Continue present medications.                        - Await pathology results.                        - Repeat colonoscopy in 3 years for surveillance.                        - Return to referring physician as previously                         scheduled. Procedure Code(s):     --- Professional ---                        [phone], Colonoscopy, flexible; with removal of                         tumor(s), polyp(s), or other lesion(s) by snare                         technique  [id number], 59, Colonoscopy, flexible; with biopsy, single                         or multiple Diagnosis Code(s):     --- Professional ---                        Z86.010, Personal history of colonic polyps                        D12.2, Benign neoplasm of ascending colon                        D12.4, Benign neoplasm of descending colon                        D12.3, Benign neoplasm of transverse colon (hepatic                         flexure or splenic flexure)                        K64.0, First degree hemorrhoids CPT copyright 2022 American Medical Association. All rights reserved. The codes documented in this report are preliminary and upon coder review may  be revised to meet current compliance requirements. Ole Schick MD, MD 07/31/2024 9:23:16 AM Number of Addenda: 0 Note Initiated On: 07/31/2024 7:42 AM Scope Withdrawal Time: 0 hours 15 minutes 20 seconds  Total Procedure Duration: 0 hours 36 minutes 55 seconds  Estimated Blood Loss:  Estimated blood loss was minimal.      [facility]

## 2024-07-31 NOTE — Transfer of Care (Signed)
 Immediate Anesthesia Transfer of Care Note  Patient: Amanda Hendricks  Procedure(s) Performed: COLONOSCOPY EGD (ESOPHAGOGASTRODUODENOSCOPY)  Patient Location: PACU  Anesthesia Type:MAC  Level of Consciousness: awake, alert , and oriented  Airway & Oxygen Therapy: Patient Spontanous Breathing  Post-op Assessment: Report given to RN, Post -op Vital signs reviewed and stable, and Patient moving all extremities X 4  Post vital signs: Reviewed and stable  Last Vitals:  Vitals Value Taken Time  BP 110/99 07/31/24 09:19  Temp 35.6 C 07/31/24 09:19  Pulse 84 07/31/24 09:19  Resp 19 07/31/24 09:19  SpO2 100 % 07/31/24 09:19    Last Pain:  Vitals:   07/31/24 0919  TempSrc: Tympanic  PainSc: 0-No pain         Complications: No notable events documented.

## 2024-07-31 NOTE — H&P (Signed)
 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 No blood thinners. No first degree relatives with GI malignancies. Last colonoscopy in 2020 with small adenoma.   Current Medications[1]  Medications Prior to Admission  Medication Sig Dispense Refill Last Dose/Taking   atorvastatin  (LIPITOR) 10 MG tablet TAKE 1 TABLET(10 MG) BY MOUTH AT BEDTIME 90 tablet 1 07/30/2024 Evening   losartan  (COZAAR ) 25 MG tablet TAKE 1 TABLET(25 MG) BY MOUTH DAILY 90 tablet 1 07/30/2024 Morning   metoprolol  tartrate (LOPRESSOR ) 25 MG tablet TAKE 1 TABLET(25 MG) BY MOUTH IN THE MORNING AND AT BEDTIME 180 tablet 1 07/30/2024 Morning   Multiple Vitamins-Minerals (BARIATRIC MULTIVITAMINS/IRON) CAPS Take 1 tablet by mouth at bedtime.   Past Week   ondansetron  (ZOFRAN -ODT) 4 MG disintegrating tablet Take 1 tablet (4 mg total) by mouth every 8 (eight) hours as needed for nausea or vomiting. 30 tablet 0 Past Month   oxyCODONE  HCl 15 MG TABA Take by mouth.   07/31/2024 at  1:30 AM   pantoprazole  (PROTONIX ) 40 MG tablet TAKE 1 TABLET BY MOUTH EVERY DAY 90 tablet 1 07/30/2024 Morning   promethazine  (PHENERGAN ) 12.5 MG tablet Take 12.5 mg by mouth every 8 (eight) hours as needed for nausea or vomiting.   Past Month   Rimegepant Sulfate (NURTEC) 75 MG TBDP Take 1 tablet (75 mg total) by mouth daily as needed (headache). 8 tablet 3 Past Month   traZODone  (DESYREL ) 100 MG tablet Take 1 tablet (100 mg total) by mouth at bedtime. 90 tablet 3 07/30/2024 Evening   acetaminophen  (TYLENOL ) 325 MG tablet Take 650 mg by mouth every 6 (six) hours as needed for mild pain or headache.      levothyroxine  (SYNTHROID ) 100 MCG tablet Take 100 mcg by mouth daily.       QUEtiapine  (SEROQUEL ) 300 MG tablet Take 1 tablet (300 mg total) by mouth at bedtime. (Patient not taking: Reported on 07/31/2024) 30 tablet 0 Not Taking     Allergies[2]   Past Medical History:  Diagnosis Date   Allergy    Arthritis    Blood transfusion without reported diagnosis    Cancer (HCC)    Compressed spine fracture (HCC)    Depression    Femur fracture (HCC)    Heart murmur    Hip fracture (HCC)    History of stomach ulcers    Hx of gastric bypass    Hypertension    Osteoporosis    Substance abuse (HCC)    Thyroid disease     Review of systems:  Otherwise negative.    Physical Exam  Gen: Alert, oriented. Appears stated age.  HEENT: PERRLA. Lungs: No respiratory distress CV: RRR Abd: soft, benign, no masses Ext: No edema    Planned procedures: Proceed with colonoscopy. The patient understands the nature of the planned procedure, indications, risks, alternatives and potential complications including but not limited to bleeding, infection, perforation, damage to internal organs and possible oversedation/side effects from anesthesia. The patient agrees and gives consent to proceed.  Please refer to procedure notes for findings, recommendations and patient disposition/instructions.     Amanda ONEIDA Schick, MD [facility]         [1]  Current Facility-Administered Medications:  0.9 %  sodium chloride  infusion, , Intravenous, Continuous, Aireana Ryland, Amanda DASEN, MD, Last Rate: 20 mL/hr at 07/31/24 0736, 500 mL at 07/31/24 0736 [2]  Allergies Allergen Reactions   Lisinopril Cough   Nsaids Other (See Comments)    Bleeding ulcers Bleeding ulcers

## 2024-07-31 NOTE — Anesthesia Preprocedure Evaluation (Signed)
"                                    Anesthesia Evaluation  Patient identified by MRN, date of birth, ID band Patient awake    Reviewed: Allergy & Precautions, NPO status , Patient's Chart, lab work & pertinent test results  History of Anesthesia Complications Negative for: history of anesthetic complications  Airway Mallampati: III  TM Distance: <3 FB Neck ROM: full    Dental  (+) Poor Dentition, Missing, Chipped, Upper Dentures   Pulmonary neg shortness of breath, sleep apnea , COPD, Current Smoker   Pulmonary exam normal        Cardiovascular Exercise Tolerance: Good hypertension, (-) angina Normal cardiovascular exam     Neuro/Psych  PSYCHIATRIC DISORDERS      negative neurological ROS     GI/Hepatic negative GI ROS,,,(+) Hepatitis -  Endo/Other  Hypothyroidism    Renal/GU negative Renal ROS  negative genitourinary   Musculoskeletal   Abdominal   Peds  Hematology negative hematology ROS (+)   Anesthesia Other Findings Past Medical History: No date: Allergy No date: Arthritis No date: Blood transfusion without reported diagnosis No date: Cancer (HCC) No date: Compressed spine fracture (HCC) No date: Depression No date: Femur fracture (HCC) No date: Heart murmur No date: Hip fracture (HCC) No date: History of stomach ulcers No date: Hx of gastric bypass No date: Hypertension No date: Osteoporosis No date: Substance abuse (HCC) No date: Thyroid disease  Past Surgical History: No date: BREAST BIOPSY; Right No date: FRACTURE SURGERY 2015: GASTRIC BYPASS  BMI    Body Mass Index: 22.55 kg/m      Reproductive/Obstetrics negative OB ROS                              Anesthesia Physical Anesthesia Plan  ASA: 3  Anesthesia Plan: General   Post-op Pain Management:    Induction: Intravenous  PONV Risk Score and Plan: Propofol infusion and TIVA  Airway Management Planned: Natural Airway and Nasal  Cannula  Additional Equipment:   Intra-op Plan:   Post-operative Plan:   Informed Consent: I have reviewed the patients History and Physical, chart, labs and discussed the procedure including the risks, benefits and alternatives for the proposed anesthesia with the patient or authorized representative who has indicated his/her understanding and acceptance.     Dental Advisory Given  Plan Discussed with: Anesthesiologist, CRNA and Surgeon  Anesthesia Plan Comments: (Patient consented for risks of anesthesia including but not limited to:  - adverse reactions to medications - risk of airway placement if required - damage to eyes, teeth, lips or other oral mucosa - nerve damage due to positioning  - sore throat or hoarseness - Damage to heart, brain, nerves, lungs, other parts of body or loss of life  Patient voiced understanding and assent.)        Anesthesia Quick Evaluation  "

## 2024-07-31 NOTE — Anesthesia Postprocedure Evaluation (Signed)
"   Anesthesia Post Note  Patient: Amanda Hendricks  Procedure(s) Performed: COLONOSCOPY EGD (ESOPHAGOGASTRODUODENOSCOPY)  Patient location during evaluation: PACU Anesthesia Type: MAC Level of consciousness: awake and alert and patient cooperative Pain management: pain level controlled Vital Signs Assessment: post-procedure vital signs reviewed and stable Respiratory status: spontaneous breathing Cardiovascular status: blood pressure returned to baseline Postop Assessment: no headache, no backache and no apparent nausea or vomiting Anesthetic complications: no   No notable events documented.   Last Vitals:  Vitals:   07/31/24 0715 07/31/24 0919  BP: (!) 150/99 (!) 110/99  Pulse: 93 84  Resp: 18 19  Temp: 36.4 C (!) 35.6 C  SpO2: 95% 100%    Last Pain:  Vitals:   07/31/24 0919  TempSrc: Tympanic  PainSc: 0-No pain                 Samael Blades A Mireille Lacombe      "

## 2024-08-03 LAB — SURGICAL PATHOLOGY

## 2024-09-17 ENCOUNTER — Ambulatory Visit
Admission: EM | Admit: 2024-09-17 | Discharge: 2024-09-17 | Disposition: A | Discharge status: Home / Self Care | Source: Home / Self Care

## 2024-09-17 ENCOUNTER — Encounter: Payer: Self-pay | Admitting: Emergency Medicine

## 2024-09-17 DIAGNOSIS — N898 Other specified noninflammatory disorders of vagina: Secondary | ICD-10-CM | POA: Diagnosis not present

## 2024-09-17 DIAGNOSIS — R350 Frequency of micturition: Secondary | ICD-10-CM | POA: Diagnosis not present

## 2024-09-17 LAB — POCT URINE DIPSTICK
Bilirubin, UA: NEGATIVE
Glucose, UA: NEGATIVE mg/dL
Ketones, POC UA: NEGATIVE mg/dL
Leukocytes, UA: NEGATIVE
Nitrite, UA: NEGATIVE
POC PROTEIN,UA: NEGATIVE
Spec Grav, UA: 1.01
Urobilinogen, UA: 0.2 U/dL
pH, UA: 5

## 2024-09-17 MED ORDER — METRONIDAZOLE 500 MG PO TABS: 500.0000 mg | ORAL_TABLET | Freq: Two times a day (BID) | ORAL | 0 refills | Status: AC

## 2024-09-17 MED ORDER — FLUCONAZOLE 150 MG PO TABS: 150.0000 mg | ORAL_TABLET | ORAL | 0 refills | Status: AC

## 2024-09-17 NOTE — Discharge Instructions (Addendum)
 Today you are evaluated for urinary and vaginal symptoms  Urinalysis is negative for any signs of a urinary infection, sample has been sent to the lab for 3 days and you will be notified if there is any bacterial growth and started on medication  Symptoms are most likely vaginal related and we will start treatment for coverage of bacterial vaginosis and yeast which occur when the vaginal pH is altered  Take metronidazole  every morning and every evening for 7 days, avoid alcohol use during treatment as it will make you feel, this is for treatment of bacterial vaginosis  Take Diflucan  tablet when you receive your medicine today and then take second dose after completion of antibiotic in 1 week, this is for treatment of yeast  Vaginal swab checking for yeast and bacterial vaginosis is pending for 2 to 3 days and you will be notified of positive test results only  In addition: Avoid baths, hot tubs and whirlpool spas.  Don't use scented or harsh soaps Avoid irritants. These include scented tampons and pads. Wipe from front to back after using the toilet. Don't douche. Your vagina doesn't require cleansing other than normal bathing.  Use a condom.  Wear cotton underwear, this fabric absorbs some moisture.

## 2024-09-17 NOTE — ED Provider Notes (Signed)
 " [facility]    CSN: [id number] Arrival date & time: 09/17/24  1328      History   Chief Complaint Chief Complaint  Patient presents with   Urinary Frequency   Vaginal Itching   Vaginal Discharge    HPI Amanda Hendricks is a 59 y.o. female.   For evaluation of clear thin vaginal discharge with odor and vaginal itching as well as urinary frequency beginning 3 days ago.  Has had mild pain to the right lower pelvic.  Has not attempted treatment.  Denies known exposure, not currently sexually active.  Denies flank pain fever dysuria or hematuria.      Past Medical History:  Diagnosis Date   Allergy    Arthritis    Blood transfusion without reported diagnosis    Cancer (HCC)    Compressed spine fracture (HCC)    Depression    Femur fracture (HCC)    Heart murmur    Hip fracture (HCC)    History of stomach ulcers    Hx of gastric bypass    Hypertension    Osteoporosis    Substance abuse (HCC)    Thyroid disease     Patient Active Problem List   Diagnosis Date Noted   Hypothyroidism 11/01/2022   Nausea 11/01/2022   Bipolar 1 disorder (HCC) 07/03/2022   Recent fracture of hip (HCC) 08/28/2021   Left hip pain    History of Roux-en-Y gastric bypass    Ischemia, bowel 07/17/2021   Marginal ulcers 06/20/2021   NASH (nonalcoholic steatohepatitis) 06/08/2021   OSA (obstructive sleep apnea) 01/20/2021   Protein-calorie malnutrition 01/20/2021   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   GAD (generalized anxiety disorder) 03/28/2017   Chronic bronchitis with emphysema (HCC) 08/23/2016   Tobacco use disorder 08/23/2016   Cushing's syndrome 07/25/2016   Dyslipidemia 05/19/2013   HTN (hypertension) 05/19/2013    Past Surgical History:  Procedure Laterality Date   BIOPSY OF SKIN SUBCUTANEOUS TISSUE AND/OR MUCOUS MEMBRANE  07/31/2024   Procedure: BIOPSY, SKIN, SUBCUTANEOUS TISSUE, OR MUCOUS MEMBRANE;  Surgeon: Maryruth Ole DASEN, MD;  Location:  [facility] ENDOSCOPY;  Service: Endoscopy;;   BREAST BIOPSY Right    COLONOSCOPY N/A 07/31/2024   Procedure: COLONOSCOPY;  Surgeon: Maryruth Ole DASEN, MD;  Location: [facility] ENDOSCOPY;  Service: Endoscopy;  Laterality: N/A;   ESOPHAGOGASTRODUODENOSCOPY N/A 07/31/2024   Procedure: EGD (ESOPHAGOGASTRODUODENOSCOPY);  Surgeon: Maryruth Ole DASEN, MD;  Location: [facility] ENDOSCOPY;  Service: Endoscopy;  Laterality: N/A;   FRACTURE SURGERY     GASTRIC BYPASS  2015   POLYPECTOMY  07/31/2024   Procedure: POLYPECTOMY, INTESTINE;  Surgeon: Maryruth Ole DASEN, MD;  Location: [facility] ENDOSCOPY;  Service: Endoscopy;;    OB History   No obstetric history on file.      Home Medications    Prior to Admission medications  Medication Sig Start Date End Date Taking? Authorizing Provider  cyclobenzaprine (FLEXERIL) 5 MG tablet Take 5 mg by mouth 3 (three) times daily as needed. 07/03/24  Yes [provider]  acetaminophen  (TYLENOL ) 325 MG tablet Take 650 mg by mouth every 6 (six) hours as needed for mild pain or headache. 08/17/21   [provider]  amLODipine (NORVASC) 5 MG tablet Take 5 mg by mouth daily.    [provider]  atorvastatin  (LIPITOR) 10 MG tablet TAKE 1 TABLET(10 MG) BY MOUTH AT BEDTIME 01/29/23   Drubel, Manuelita, PA-C  levothyroxine  (SYNTHROID ) 100 MCG tablet Take 100 mcg by mouth daily. 02/21/21  10/02/22  [provider]  losartan  (COZAAR ) 25 MG tablet TAKE 1 TABLET(25 MG) BY MOUTH DAILY 04/06/23   Webb, Padonda B, FNP  metoprolol  tartrate (LOPRESSOR ) 25 MG tablet TAKE 1 TABLET(25 MG) BY MOUTH IN THE MORNING AND AT BEDTIME 01/29/23   Drubel, Manuelita, PA-C  Multiple Vitamins-Minerals (BARIATRIC MULTIVITAMINS/IRON) CAPS Take 1 tablet by mouth at bedtime.    [provider]  ondansetron  (ZOFRAN -ODT) 4 MG disintegrating tablet Take 1 tablet (4 mg total) by mouth every 8 (eight) hours as needed for nausea or vomiting. 06/01/24   Suzanne Kirsch, MD  oxyCODONE  HCl 15  MG TABA Take by mouth.    [provider]  pantoprazole  (PROTONIX ) 40 MG tablet TAKE 1 TABLET BY MOUTH EVERY DAY 04/10/23   Drubel, Manuelita, PA-C  promethazine  (PHENERGAN ) 12.5 MG tablet Take 12.5 mg by mouth every 8 (eight) hours as needed for nausea or vomiting. 08/17/21   [provider]  QUEtiapine  (SEROQUEL ) 300 MG tablet Take 1 tablet (300 mg total) by mouth at bedtime. Patient not taking: Reported on 07/31/2024 08/27/22   Cyndi Manuelita, PA-C  Rimegepant Sulfate (NURTEC) 75 MG TBDP Take 1 tablet (75 mg total) by mouth daily as needed (headache). 06/10/23   Athar, Saima, MD  traZODone  (DESYREL ) 100 MG tablet Take 1 tablet (100 mg total) by mouth at bedtime. 12/14/21   Bertrum Charlie CROME, MD    Family History Family History  Problem Relation Age of Onset   Arthritis Mother    Hypertension Mother    Heart disease Mother    COPD Mother    Migraines Mother    Breast cancer Paternal Aunt     Social History Social History[1]   Allergies   Lisinopril and Nsaids   Review of Systems Review of Systems  Genitourinary:  Positive for frequency and vaginal discharge. Negative for decreased urine volume, difficulty urinating, dyspareunia, dysuria, enuresis, flank pain, genital sores, hematuria, menstrual problem, pelvic pain, urgency, vaginal bleeding and vaginal pain.     Physical Exam Triage Vital Signs ED Triage Vitals  Encounter Vitals Group     BP 09/17/24 1349 (!) 150/86     Girls Systolic BP Percentile --      Girls Diastolic BP Percentile --      Boys Systolic BP Percentile --      Boys Diastolic BP Percentile --      Pulse Rate 09/17/24 1349 76     Resp 09/17/24 1349 19     Temp 09/17/24 1349 98.2 F (36.8 C)     Temp Source 09/17/24 1349 Oral     SpO2 09/17/24 1349 97 %     Weight --      Height --      Head Circumference --      Peak Flow --      Pain Score 09/17/24 1352 0     Pain Loc --      Pain Education --      Exclude from Growth Chart --     No data found.  Updated Vital Signs BP (!) 150/86 (BP Location: Right Arm)   Pulse 76   Temp 98.2 F (36.8 C) (Oral)   Resp 19   SpO2 97%   Visual Acuity Right Eye Distance:   Left Eye Distance:   Bilateral Distance:    Right Eye Near:   Left Eye Near:    Bilateral Near:     Physical Exam Constitutional:      Appearance: Normal  appearance.  Eyes:     Extraocular Movements: Extraocular movements intact.  Pulmonary:     Effort: Pulmonary effort is normal.  Abdominal:     General: There is no distension.     Tenderness: There is no abdominal tenderness. There is no right CVA tenderness, left CVA tenderness or guarding.  Genitourinary:    Comments: deferred Neurological:     Mental Status: She is alert and oriented to person, place, and time.      UC Treatments / Results  Labs (all labs ordered are listed, but only abnormal results are displayed) Labs Reviewed  POCT URINE DIPSTICK - Abnormal; Notable for the following components:      Result Value   Blood, UA trace-intact (*)    All other components within normal limits  URINE CULTURE    EKG   Radiology No results found.  Procedures Procedures (including critical care time)  Medications Ordered in UC Medications - No data to display  Initial Impression / Assessment and Plan / UC Course  I have reviewed the triage vital signs and the nursing notes.  Pertinent labs & imaging results that were available during my care of the patient were reviewed by me and considered in my medical decision making (see chart for details).  Urinary frequency, vaginal discharge  Urinalysis negative, sent for culture, discussed findings with patient, etiology most likely vaginal infection treating empirically for bacterial vaginosis and yeast, labs pending, metronidazole  and Diflucan  prescribed discussed administration and recommended nonpharmacological supportive care may follow-up with urgent care as needed Final  Clinical Impressions(s) / UC Diagnoses   Final diagnoses:  Urinary frequency   Discharge Instructions   None    ED Prescriptions   None    PDMP not reviewed this encounter.     [1]  Social History Tobacco Use   Smoking status: Some Days    Current packs/day: 0.25    Types: Cigarettes   Smokeless tobacco: Never  Vaping Use   Vaping status: Never Used  Substance Use Topics   Alcohol use: Not Currently   Drug use: Not Currently     Teresa Shelba SAUNDERS, NP 09/17/24 1437  "

## 2024-09-17 NOTE — ED Triage Notes (Signed)
 Patient reports vaginal itching, vagina odor  and clear discharge x 3 days. Patient has not taking anything symptoms. Patient also reports urinary frequency x 3 days.

## 2024-09-18 LAB — CERVICOVAGINAL ANCILLARY ONLY
Bacterial Vaginitis (gardnerella): NEGATIVE
Candida Glabrata: NEGATIVE
Candida Vaginitis: POSITIVE — AB
Comment: NEGATIVE
Comment: NEGATIVE
Comment: NEGATIVE
